# Patient Record
Sex: Female | Born: 1955 | Race: Black or African American | Hispanic: No | Marital: Single | Smoking: Current every day smoker
Health system: Southern US, Community
[De-identification: ages and names within clinical notes are randomized; demographics above are authoritative.]

## PROBLEM LIST (undated history)

## (undated) DIAGNOSIS — K219 Gastro-esophageal reflux disease without esophagitis: Secondary | ICD-10-CM

## (undated) DIAGNOSIS — T7840XA Allergy, unspecified, initial encounter: Secondary | ICD-10-CM

## (undated) DIAGNOSIS — E559 Vitamin D deficiency, unspecified: Secondary | ICD-10-CM

## (undated) DIAGNOSIS — K649 Unspecified hemorrhoids: Secondary | ICD-10-CM

## (undated) DIAGNOSIS — M25562 Pain in left knee: Secondary | ICD-10-CM

## (undated) DIAGNOSIS — I10 Essential (primary) hypertension: Secondary | ICD-10-CM

## (undated) DIAGNOSIS — R7303 Prediabetes: Secondary | ICD-10-CM

## (undated) DIAGNOSIS — C801 Malignant (primary) neoplasm, unspecified: Secondary | ICD-10-CM

## (undated) DIAGNOSIS — E119 Type 2 diabetes mellitus without complications: Secondary | ICD-10-CM

## (undated) DIAGNOSIS — M1990 Unspecified osteoarthritis, unspecified site: Secondary | ICD-10-CM

## (undated) DIAGNOSIS — E7800 Pure hypercholesterolemia, unspecified: Secondary | ICD-10-CM

## (undated) HISTORY — DX: Vitamin D deficiency, unspecified: E55.9

## (undated) HISTORY — DX: Gastro-esophageal reflux disease without esophagitis: K21.9

## (undated) HISTORY — DX: Prediabetes: R73.03

## (undated) HISTORY — DX: Unspecified osteoarthritis, unspecified site: M19.90

## (undated) HISTORY — PX: ABDOMINAL SURGERY: SHX537

## (undated) HISTORY — DX: Pain in left knee: M25.562

## (undated) HISTORY — DX: Allergy, unspecified, initial encounter: T78.40XA

## (undated) HISTORY — DX: Malignant (primary) neoplasm, unspecified: C80.1

## (undated) HISTORY — PX: HEMORRHOID SURGERY: SHX153

## (undated) HISTORY — DX: Type 2 diabetes mellitus without complications: E11.9

## (undated) HISTORY — DX: Unspecified hemorrhoids: K64.9

## (undated) HISTORY — PX: TONSILLECTOMY: SUR1361

---

## 1999-09-09 ENCOUNTER — Other Ambulatory Visit: Admission: RE | Admit: 1999-09-09 | Discharge: 1999-09-09 | Payer: Self-pay | Admitting: Obstetrics

## 1999-09-18 ENCOUNTER — Encounter: Payer: Self-pay | Admitting: Obstetrics

## 1999-09-18 ENCOUNTER — Ambulatory Visit (HOSPITAL_COMMUNITY): Admission: RE | Admit: 1999-09-18 | Discharge: 1999-09-18 | Payer: Self-pay | Admitting: Obstetrics

## 1999-10-02 ENCOUNTER — Emergency Department (HOSPITAL_COMMUNITY): Admission: EM | Admit: 1999-10-02 | Discharge: 1999-10-02 | Payer: Self-pay | Admitting: Emergency Medicine

## 1999-10-02 ENCOUNTER — Encounter: Payer: Self-pay | Admitting: Emergency Medicine

## 2005-01-14 ENCOUNTER — Emergency Department (HOSPITAL_COMMUNITY): Admission: EM | Admit: 2005-01-14 | Discharge: 2005-01-14 | Payer: Self-pay | Admitting: Emergency Medicine

## 2005-02-04 ENCOUNTER — Ambulatory Visit: Payer: Self-pay | Admitting: Family Medicine

## 2005-02-10 ENCOUNTER — Ambulatory Visit: Payer: Self-pay | Admitting: *Deleted

## 2005-03-25 ENCOUNTER — Ambulatory Visit: Payer: Self-pay | Admitting: *Deleted

## 2005-04-08 ENCOUNTER — Emergency Department (HOSPITAL_COMMUNITY): Admission: EM | Admit: 2005-04-08 | Discharge: 2005-04-08 | Payer: Self-pay | Admitting: Emergency Medicine

## 2005-04-16 ENCOUNTER — Emergency Department (HOSPITAL_COMMUNITY): Admission: EM | Admit: 2005-04-16 | Discharge: 2005-04-16 | Payer: Self-pay | Admitting: Family Medicine

## 2005-06-29 ENCOUNTER — Ambulatory Visit: Payer: Self-pay | Admitting: Family Medicine

## 2005-10-30 ENCOUNTER — Ambulatory Visit: Payer: Self-pay | Admitting: Family Medicine

## 2006-02-08 ENCOUNTER — Ambulatory Visit: Payer: Self-pay | Admitting: Family Medicine

## 2006-02-10 ENCOUNTER — Ambulatory Visit: Payer: Self-pay | Admitting: Family Medicine

## 2006-05-11 ENCOUNTER — Ambulatory Visit: Payer: Self-pay | Admitting: Internal Medicine

## 2006-05-18 ENCOUNTER — Ambulatory Visit: Payer: Self-pay | Admitting: Internal Medicine

## 2006-09-10 ENCOUNTER — Emergency Department (HOSPITAL_COMMUNITY): Admission: EM | Admit: 2006-09-10 | Discharge: 2006-09-11 | Payer: Self-pay | Admitting: Emergency Medicine

## 2006-09-20 ENCOUNTER — Emergency Department (HOSPITAL_COMMUNITY): Admission: EM | Admit: 2006-09-20 | Discharge: 2006-09-20 | Payer: Self-pay | Admitting: Family Medicine

## 2006-09-27 ENCOUNTER — Ambulatory Visit: Payer: Self-pay | Admitting: Family Medicine

## 2007-03-30 ENCOUNTER — Encounter (INDEPENDENT_AMBULATORY_CARE_PROVIDER_SITE_OTHER): Payer: Self-pay | Admitting: *Deleted

## 2007-04-27 ENCOUNTER — Encounter (INDEPENDENT_AMBULATORY_CARE_PROVIDER_SITE_OTHER): Payer: Self-pay | Admitting: Family Medicine

## 2007-04-27 ENCOUNTER — Ambulatory Visit: Payer: Self-pay | Admitting: Internal Medicine

## 2007-04-27 ENCOUNTER — Encounter: Payer: Self-pay | Admitting: Family Medicine

## 2007-04-27 LAB — CONVERTED CEMR LAB
ALT: 15 units/L (ref 0–35)
AST: 19 units/L (ref 0–37)
Albumin: 4.4 g/dL (ref 3.5–5.2)
Alkaline Phosphatase: 61 units/L (ref 39–117)
Basophils Relative: 1 % (ref 0–1)
Calcium: 9.7 mg/dL (ref 8.4–10.5)
Chlamydia, DNA Probe: NEGATIVE
Cholesterol: 176 mg/dL (ref 0–200)
Eosinophils Relative: 4 % (ref 0–5)
GC Probe Amp, Genital: NEGATIVE
HCT: 37.4 % (ref 36.0–46.0)
HDL: 40 mg/dL (ref >39)
Hemoglobin: 12.4 g/dL (ref 12.0–15.0)
Lymphocytes Relative: 47 % — ABNORMAL HIGH (ref 12–46)
Monocytes Absolute: 0.5 10*3/uL (ref 0.2–0.7)
Neutro Abs: 2.9 10*3/uL (ref 1.7–7.7)
Neutrophils Relative %: 41 % — ABNORMAL LOW (ref 43–77)
Pap Smear: NORMAL
Platelets: 347 10*3/uL (ref 150–400)
RDW: 14.5 % — ABNORMAL HIGH (ref 11.5–14.0)
Total CHOL/HDL Ratio: 4.4

## 2007-05-02 ENCOUNTER — Ambulatory Visit (HOSPITAL_COMMUNITY): Admission: RE | Admit: 2007-05-02 | Discharge: 2007-05-02 | Payer: Self-pay | Admitting: Family Medicine

## 2007-05-12 ENCOUNTER — Encounter (INDEPENDENT_AMBULATORY_CARE_PROVIDER_SITE_OTHER): Payer: Self-pay | Admitting: Family Medicine

## 2007-05-12 DIAGNOSIS — F1410 Cocaine abuse, uncomplicated: Secondary | ICD-10-CM | POA: Insufficient documentation

## 2007-05-12 DIAGNOSIS — I10 Essential (primary) hypertension: Secondary | ICD-10-CM | POA: Insufficient documentation

## 2007-05-12 DIAGNOSIS — A539 Syphilis, unspecified: Secondary | ICD-10-CM | POA: Insufficient documentation

## 2007-05-12 DIAGNOSIS — F1210 Cannabis abuse, uncomplicated: Secondary | ICD-10-CM | POA: Insufficient documentation

## 2007-06-20 ENCOUNTER — Ambulatory Visit: Payer: Self-pay | Admitting: Internal Medicine

## 2008-01-12 ENCOUNTER — Emergency Department (HOSPITAL_COMMUNITY): Admission: EM | Admit: 2008-01-12 | Discharge: 2008-01-12 | Payer: Self-pay | Admitting: Emergency Medicine

## 2008-07-03 ENCOUNTER — Encounter (INDEPENDENT_AMBULATORY_CARE_PROVIDER_SITE_OTHER): Payer: Self-pay | Admitting: Adult Health

## 2008-07-03 ENCOUNTER — Ambulatory Visit: Payer: Self-pay | Admitting: Internal Medicine

## 2008-07-03 LAB — CONVERTED CEMR LAB
AST: 19 units/L (ref 0–37)
Albumin: 4.8 g/dL (ref 3.5–5.2)
Basophils Absolute: 0 10*3/uL (ref 0.0–0.1)
CO2: 23 meq/L (ref 19–32)
Chloride: 103 meq/L (ref 96–112)
Cholesterol: 231 mg/dL — ABNORMAL HIGH (ref 0–200)
Creatinine, Ser: 1.15 mg/dL (ref 0.40–1.20)
HCT: 41.7 % (ref 36.0–46.0)
HDL: 55 mg/dL (ref >39)
Hemoglobin: 13.4 g/dL (ref 12.0–15.0)
LDL Cholesterol: 151 mg/dL — ABNORMAL HIGH (ref 0–99)
Lymphocytes Relative: 40 % (ref 12–46)
Lymphs Abs: 2.6 10*3/uL (ref 0.7–4.0)
MCHC: 32.1 g/dL (ref 30.0–36.0)
Monocytes Absolute: 0.4 10*3/uL (ref 0.1–1.0)
Monocytes Relative: 6 % (ref 3–12)
Neutro Abs: 3.3 10*3/uL (ref 1.7–7.7)
Platelets: 396 10*3/uL (ref 150–400)
Total Bilirubin: 0.6 mg/dL (ref 0.3–1.2)
VLDL: 25 mg/dL (ref 0–40)
WBC: 6.5 10*3/uL (ref 4.0–10.5)

## 2008-07-13 DIAGNOSIS — M25562 Pain in left knee: Secondary | ICD-10-CM

## 2008-07-13 HISTORY — DX: Pain in left knee: M25.562

## 2009-04-22 ENCOUNTER — Ambulatory Visit: Payer: Self-pay | Admitting: Internal Medicine

## 2009-04-22 ENCOUNTER — Encounter (INDEPENDENT_AMBULATORY_CARE_PROVIDER_SITE_OTHER): Payer: Self-pay | Admitting: Adult Health

## 2009-04-22 LAB — CONVERTED CEMR LAB
ALT: 11 units/L (ref 0–35)
AST: 19 units/L (ref 0–37)
Albumin: 4.8 g/dL (ref 3.5–5.2)
BUN: 17 mg/dL (ref 6–23)
CO2: 23 meq/L (ref 19–32)
Glucose, Bld: 78 mg/dL (ref 70–99)
Potassium: 3.5 meq/L (ref 3.5–5.3)
Sodium: 141 meq/L (ref 135–145)
Total Protein: 7.4 g/dL (ref 6.0–8.3)

## 2009-06-04 ENCOUNTER — Encounter (INDEPENDENT_AMBULATORY_CARE_PROVIDER_SITE_OTHER): Payer: Self-pay | Admitting: Adult Health

## 2009-06-04 ENCOUNTER — Ambulatory Visit: Payer: Self-pay | Admitting: Internal Medicine

## 2009-07-08 ENCOUNTER — Ambulatory Visit (HOSPITAL_COMMUNITY): Admission: RE | Admit: 2009-07-08 | Discharge: 2009-07-08 | Payer: Self-pay | Admitting: Internal Medicine

## 2009-10-02 ENCOUNTER — Ambulatory Visit: Payer: Self-pay | Admitting: Internal Medicine

## 2009-10-21 ENCOUNTER — Ambulatory Visit: Payer: Self-pay | Admitting: Internal Medicine

## 2010-01-20 ENCOUNTER — Ambulatory Visit: Payer: Self-pay | Admitting: Internal Medicine

## 2010-01-20 ENCOUNTER — Encounter (INDEPENDENT_AMBULATORY_CARE_PROVIDER_SITE_OTHER): Payer: Self-pay | Admitting: Adult Health

## 2010-01-20 LAB — CONVERTED CEMR LAB: Microalb, Ur: 0.5 mg/dL (ref 0.00–1.89)

## 2010-01-23 ENCOUNTER — Ambulatory Visit: Payer: Self-pay | Admitting: Internal Medicine

## 2010-01-24 ENCOUNTER — Ambulatory Visit: Payer: Self-pay | Admitting: Internal Medicine

## 2010-01-24 ENCOUNTER — Encounter (INDEPENDENT_AMBULATORY_CARE_PROVIDER_SITE_OTHER): Payer: Self-pay | Admitting: Adult Health

## 2010-01-24 LAB — CONVERTED CEMR LAB
Albumin: 4.7 g/dL (ref 3.5–5.2)
CO2: 24 meq/L (ref 19–32)
Calcium: 10.1 mg/dL (ref 8.4–10.5)
HDL: 44 mg/dL (ref >39)
LDL Cholesterol: 106 mg/dL — ABNORMAL HIGH (ref 0–99)
MCV: 93.6 fL (ref 78.0–100.0)
Platelets: 389 10*3/uL (ref 150–400)
RBC: 4.38 M/uL (ref 3.87–5.11)
RDW: 15 % (ref 11.5–15.5)
Total Bilirubin: 0.5 mg/dL (ref 0.3–1.2)
Triglycerides: 210 mg/dL — ABNORMAL HIGH (ref <150)
VLDL: 42 mg/dL — ABNORMAL HIGH (ref 0–40)
WBC: 7.8 10*3/uL (ref 4.0–10.5)

## 2010-01-28 ENCOUNTER — Ambulatory Visit (HOSPITAL_COMMUNITY): Admission: RE | Admit: 2010-01-28 | Discharge: 2010-01-28 | Payer: Self-pay | Admitting: Surgery

## 2010-08-19 ENCOUNTER — Other Ambulatory Visit (HOSPITAL_COMMUNITY): Payer: Self-pay | Admitting: Internal Medicine

## 2010-08-19 DIAGNOSIS — Z1231 Encounter for screening mammogram for malignant neoplasm of breast: Secondary | ICD-10-CM

## 2010-08-19 DIAGNOSIS — Z139 Encounter for screening, unspecified: Secondary | ICD-10-CM

## 2010-08-28 ENCOUNTER — Ambulatory Visit (HOSPITAL_COMMUNITY)
Admission: RE | Admit: 2010-08-28 | Discharge: 2010-08-28 | Disposition: A | Discharge status: Home / Self Care | Payer: Self-pay | Source: Ambulatory Visit | Attending: Internal Medicine | Admitting: Internal Medicine

## 2010-08-28 DIAGNOSIS — Z1231 Encounter for screening mammogram for malignant neoplasm of breast: Secondary | ICD-10-CM

## 2010-09-16 ENCOUNTER — Other Ambulatory Visit (HOSPITAL_COMMUNITY): Payer: Self-pay | Admitting: Family Medicine

## 2010-09-16 DIAGNOSIS — Z1231 Encounter for screening mammogram for malignant neoplasm of breast: Secondary | ICD-10-CM

## 2010-09-24 ENCOUNTER — Ambulatory Visit (HOSPITAL_COMMUNITY)
Admission: RE | Admit: 2010-09-24 | Discharge: 2010-09-24 | Disposition: A | Discharge status: Home / Self Care | Payer: Self-pay | Source: Ambulatory Visit | Attending: Family Medicine | Admitting: Family Medicine

## 2010-09-24 DIAGNOSIS — Z1231 Encounter for screening mammogram for malignant neoplasm of breast: Secondary | ICD-10-CM | POA: Insufficient documentation

## 2010-09-26 ENCOUNTER — Other Ambulatory Visit: Payer: Self-pay | Admitting: Family Medicine

## 2010-09-26 DIAGNOSIS — R928 Other abnormal and inconclusive findings on diagnostic imaging of breast: Secondary | ICD-10-CM

## 2010-09-27 LAB — RAPID URINE DRUG SCREEN, HOSP PERFORMED
Benzodiazepines: NOT DETECTED
Cocaine: POSITIVE — AB
Opiates: NOT DETECTED

## 2010-09-28 LAB — SURGICAL PCR SCREEN
MRSA, PCR: INVALID — AB
Staphylococcus aureus: INVALID — AB

## 2010-09-28 LAB — CBC
HCT: 37.1 % (ref 36.0–46.0)
MCV: 93 fL (ref 78.0–100.0)

## 2010-09-28 LAB — BASIC METABOLIC PANEL
BUN: 12 mg/dL (ref 6–23)
Calcium: 9.5 mg/dL (ref 8.4–10.5)
Chloride: 102 mEq/L (ref 96–112)
Creatinine, Ser: 1.36 mg/dL — ABNORMAL HIGH (ref 0.4–1.2)
GFR calc Af Amer: 49 mL/min — ABNORMAL LOW (ref >60)
Glucose, Bld: 92 mg/dL (ref 70–99)
Potassium: 3 mEq/L — ABNORMAL LOW (ref 3.5–5.1)
Sodium: 141 mEq/L (ref 135–145)

## 2010-09-28 LAB — DIFFERENTIAL
Lymphocytes Relative: 41 % (ref 12–46)
Monocytes Absolute: 0.3 10*3/uL (ref 0.1–1.0)
Monocytes Relative: 5 % (ref 3–12)
Neutro Abs: 3.3 10*3/uL (ref 1.7–7.7)
Neutrophils Relative %: 51 % (ref 43–77)

## 2010-09-28 LAB — MRSA CULTURE

## 2010-10-02 ENCOUNTER — Ambulatory Visit
Admission: RE | Admit: 2010-10-02 | Discharge: 2010-10-02 | Disposition: A | Discharge status: Home / Self Care | Payer: PRIVATE HEALTH INSURANCE | Source: Ambulatory Visit | Attending: Family Medicine | Admitting: Family Medicine

## 2010-10-02 DIAGNOSIS — R928 Other abnormal and inconclusive findings on diagnostic imaging of breast: Secondary | ICD-10-CM

## 2011-04-09 LAB — URINALYSIS, ROUTINE W REFLEX MICROSCOPIC
Bilirubin Urine: NEGATIVE
pH: 8

## 2011-05-21 ENCOUNTER — Other Ambulatory Visit: Payer: Self-pay | Admitting: Family Medicine

## 2011-05-21 DIAGNOSIS — N6009 Solitary cyst of unspecified breast: Secondary | ICD-10-CM

## 2011-06-01 ENCOUNTER — Ambulatory Visit
Admission: RE | Admit: 2011-06-01 | Discharge: 2011-06-01 | Disposition: A | Discharge status: Home / Self Care | Payer: No Typology Code available for payment source | Source: Ambulatory Visit | Attending: Family Medicine | Admitting: Family Medicine

## 2011-06-01 DIAGNOSIS — N6009 Solitary cyst of unspecified breast: Secondary | ICD-10-CM

## 2011-09-08 ENCOUNTER — Other Ambulatory Visit: Payer: Self-pay | Admitting: Family Medicine

## 2011-09-08 DIAGNOSIS — N6009 Solitary cyst of unspecified breast: Secondary | ICD-10-CM

## 2011-09-28 ENCOUNTER — Ambulatory Visit
Admission: RE | Admit: 2011-09-28 | Discharge: 2011-09-28 | Disposition: A | Discharge status: Home / Self Care | Payer: Self-pay | Source: Ambulatory Visit | Attending: Family Medicine | Admitting: Family Medicine

## 2011-09-28 DIAGNOSIS — N6009 Solitary cyst of unspecified breast: Secondary | ICD-10-CM

## 2014-03-17 ENCOUNTER — Encounter (HOSPITAL_COMMUNITY): Payer: Self-pay | Admitting: Emergency Medicine

## 2014-03-17 ENCOUNTER — Emergency Department (HOSPITAL_COMMUNITY)
Admission: EM | Admit: 2014-03-17 | Discharge: 2014-03-18 | Disposition: A | Discharge status: Home / Self Care | Payer: Self-pay | Attending: Emergency Medicine | Admitting: Emergency Medicine

## 2014-03-17 DIAGNOSIS — I10 Essential (primary) hypertension: Secondary | ICD-10-CM | POA: Insufficient documentation

## 2014-03-17 DIAGNOSIS — M79609 Pain in unspecified limb: Secondary | ICD-10-CM | POA: Insufficient documentation

## 2014-03-17 DIAGNOSIS — Z79899 Other long term (current) drug therapy: Secondary | ICD-10-CM | POA: Insufficient documentation

## 2014-03-17 DIAGNOSIS — R109 Unspecified abdominal pain: Secondary | ICD-10-CM | POA: Insufficient documentation

## 2014-03-17 DIAGNOSIS — E7800 Pure hypercholesterolemia, unspecified: Secondary | ICD-10-CM | POA: Insufficient documentation

## 2014-03-17 DIAGNOSIS — R1032 Left lower quadrant pain: Secondary | ICD-10-CM

## 2014-03-17 DIAGNOSIS — F17200 Nicotine dependence, unspecified, uncomplicated: Secondary | ICD-10-CM | POA: Insufficient documentation

## 2014-03-17 HISTORY — DX: Essential (primary) hypertension: I10

## 2014-03-17 HISTORY — DX: Pure hypercholesterolemia, unspecified: E78.00

## 2014-03-17 LAB — URINALYSIS, ROUTINE W REFLEX MICROSCOPIC
Bilirubin Urine: NEGATIVE
GLUCOSE, UA: NEGATIVE mg/dL
HGB URINE DIPSTICK: NEGATIVE
Ketones, ur: NEGATIVE mg/dL
LEUKOCYTES UA: NEGATIVE
Nitrite: NEGATIVE
PH: 7 (ref 5.0–8.0)
PROTEIN: NEGATIVE mg/dL
SPECIFIC GRAVITY, URINE: 1.013 (ref 1.005–1.030)
Urobilinogen, UA: 0.2 mg/dL (ref 0.0–1.0)

## 2014-03-17 NOTE — ED Provider Notes (Signed)
CSN: [id number]     Arrival date & time 03/17/14  1907 History   First MD Initiated Contact with Patient 03/17/14 2239     Chief Complaint  Patient presents with  . Leg Pain     (Consider location/radiation/quality/duration/timing/severity/associated sxs/prior Treatment) HPI Comments: The patient is a 58 year old female presenting with left-sided groin pain ongoing for 2 days. The patient reports similar symptoms in the past with a UTI several years ago. Patient denies pain at this time, relieved with Tylenol at home. Denies urinary symptoms, hematuria, dysuria. Denies vaginal discharge, vaginal lesions. She denies abdominal pain, nausea, vomiting, diarrhea, constipation. Denies fever chills. Denies recent injury.  No aggravating or relieving factors, not worsened with movement. No lower extremity swelling, calf tenderness. No recent travel, family history or personal history of DVT/PE, lower extremity swelling, cancer, or exogenous estrogen.  PCP: [facility]  Patient is a 58 y.o. female presenting with leg pain. The history is provided by the patient. No language interpreter was used.  Leg Pain Associated symptoms: no back pain and no fever     Past Medical History  Diagnosis Date  . Hypertension   . Hypercholesteremia    History reviewed. No pertinent past surgical history. History reviewed. No pertinent family history. History  Substance Use Topics  . Smoking status: Current Every Day Smoker  . Smokeless tobacco: Never Used  . Alcohol Use: Yes     Comment: occasionally   OB History   Grav Para Term Preterm Abortions TAB SAB Ect Mult Living                 Review of Systems  Constitutional: Negative for fever and chills.  Respiratory: Negative for shortness of breath.   Cardiovascular: Negative for chest pain, palpitations and leg swelling.  Gastrointestinal: Negative for nausea, vomiting, abdominal pain, diarrhea, constipation, blood in stool and anal bleeding.   Genitourinary: Negative for dysuria, urgency, hematuria, flank pain, vaginal discharge and genital sores.  Musculoskeletal: Negative for back pain.      Allergies  Review of patient's allergies indicates no known allergies.  Home Medications   Prior to Admission medications   Medication Sig Start Date End Date Taking? Authorizing Provider  atenolol-chlorthalidone (TENORETIC) 50-25 MG per tablet Take 1 tablet by mouth daily.   Yes Historical Provider, MD  simvastatin (ZOCOR) 10 MG tablet Take 10 mg by mouth daily at 6 PM.   Yes Historical Provider, MD   BP 132/81  Pulse 65  Temp(Src) 98.1 F (36.7 C) (Oral)  Resp 20  Ht 5\' 2"  (1.575 m)  Wt 117 lb 9.6 oz (53.343 kg)  BMI 21.50 kg/m2  SpO2 98% Physical Exam  Nursing note and vitals reviewed. Constitutional: She is oriented to person, place, and time. She appears well-developed and well-nourished.  Non-toxic appearance. She does not have a sickly appearance. She does not appear ill. No distress.  Pt eating [employer] upon entering room.  HENT:  Head: Normocephalic and atraumatic.  Eyes: EOM are normal. Pupils are equal, round, and reactive to light. Right eye exhibits no discharge. Left eye exhibits no discharge. No scleral icterus.  Neck: Normal range of motion. Neck supple.  Cardiovascular: Normal rate and regular rhythm.   No murmur heard. No lower extremity edema  Pulmonary/Chest: Effort normal and breath sounds normal. She has no wheezes. She has no rales. She exhibits no tenderness.  Abdominal: Soft. Bowel sounds are normal. She exhibits no distension. There is no tenderness. There is no rebound, no guarding and  no CVA tenderness. Hernia confirmed negative in the left inguinal area.  No tenderness to left groin or palpable lymphadenopathy.  Musculoskeletal: Normal range of motion. She exhibits no edema.  Left lower extremity: Full active range of motion without discomfort. Good strength and sensation equal bilaterally. Normal  gait.  Lymphadenopathy:       Left: No inguinal adenopathy present.  Neurological: She is alert and oriented to person, place, and time.  Skin: Skin is warm and dry. No rash noted. She is not diaphoretic.  Psychiatric: She has a normal mood and affect. Her behavior is normal. Thought content normal.    ED Course  Procedures (including critical care time) Labs Review Labs Reviewed  URINALYSIS, ROUTINE W REFLEX MICROSCOPIC    Imaging Review No results found.   EKG Interpretation None      MDM   Final diagnoses:  Groin pain, left   Patient presents with left groin pain not reproducible no abdominal pain no CVA tenderness patient is nontoxic, afebrile. UA ordered. No sign DVT on exam, Wells criteria negative. UA negative for infection. Patient denies pain in ED. Plan to discharge with instructions to followup with PCP drink plenty of fluids. Discussed lab results and treatment plan with the patient. Return precautions given. Reports understanding and no other concerns at this time.  Patient is stable for discharge at this time.   Harvie Heck, PA-C 03/18/14 [phone]

## 2014-03-17 NOTE — ED Notes (Signed)
No answer

## 2014-03-17 NOTE — ED Notes (Signed)
Patient presents stating she has been having pain to the top of her left thigh up in to the left lower abd

## 2014-03-18 NOTE — ED Provider Notes (Signed)
Medical screening examination/treatment/procedure(s) were performed by non-physician practitioner and as supervising physician I was immediately available for consultation/collaboration.   EKG Interpretation None        Delice Bison Tad Fancher, DO 03/18/14 1923

## 2014-03-18 NOTE — ED Notes (Signed)
Per EMT, pt walked out of room dressed in street clothes, stated goodbye to EMT and left department. Event not witnessed by RN.

## 2014-03-18 NOTE — Discharge Instructions (Signed)
Call for a follow up appointment with a Family or Primary Care Provider.  Return if Symptoms worsen.   Take medication as prescribed.  Drink plenty of fluids.

## 2014-05-07 ENCOUNTER — Other Ambulatory Visit: Payer: Self-pay | Admitting: Primary Care

## 2014-05-07 DIAGNOSIS — Z1231 Encounter for screening mammogram for malignant neoplasm of breast: Secondary | ICD-10-CM

## 2014-05-16 ENCOUNTER — Ambulatory Visit
Admission: RE | Admit: 2014-05-16 | Discharge: 2014-05-16 | Disposition: A | Discharge status: Home / Self Care | Payer: No Typology Code available for payment source | Source: Ambulatory Visit | Attending: Primary Care | Admitting: Primary Care

## 2014-05-16 DIAGNOSIS — Z1231 Encounter for screening mammogram for malignant neoplasm of breast: Secondary | ICD-10-CM

## 2015-01-27 ENCOUNTER — Emergency Department (INDEPENDENT_AMBULATORY_CARE_PROVIDER_SITE_OTHER)
Admission: EM | Admit: 2015-01-27 | Discharge: 2015-01-27 | Disposition: A | Discharge status: Home / Self Care | Payer: No Typology Code available for payment source | Source: Home / Self Care | Attending: Emergency Medicine | Admitting: Emergency Medicine

## 2015-01-27 ENCOUNTER — Encounter (HOSPITAL_COMMUNITY): Payer: Self-pay | Admitting: *Deleted

## 2015-01-27 DIAGNOSIS — H6002 Abscess of left external ear: Secondary | ICD-10-CM

## 2015-01-27 DIAGNOSIS — H6012 Cellulitis of left external ear: Secondary | ICD-10-CM

## 2015-01-27 MED ORDER — NEOMYCIN-POLYMYXIN-HC 3.5-10000-1 OT SUSP: 4.0000 [drp] | Freq: Four times a day (QID) | OTIC | Status: DC

## 2015-01-27 MED ORDER — HYDROCODONE-ACETAMINOPHEN 5-325 MG PO TABS
ORAL_TABLET | ORAL | Status: AC
  Filled 2015-01-27: qty 1

## 2015-01-27 MED ORDER — HYDROCODONE-ACETAMINOPHEN 5-325 MG PO TABS: 2.0000 | ORAL_TABLET | ORAL | Status: DC | PRN

## 2015-01-27 MED ORDER — HYDROCODONE-ACETAMINOPHEN 5-325 MG PO TABS
1.0000 | ORAL_TABLET | Freq: Once | ORAL | Status: AC
  Administered 2015-01-27: 1 via ORAL

## 2015-01-27 MED ORDER — DOXYCYCLINE HYCLATE 100 MG PO CAPS: 100.0000 mg | ORAL_CAPSULE | Freq: Two times a day (BID) | ORAL | Status: DC

## 2015-01-27 NOTE — ED Notes (Signed)
C/O left external ear pain x 1 wk with small abscess to lower ear.  Had same infection 5/31 - was treated with course of SMZ-TMP DS.  Has taken Advil and been cleaning area with soap daily without any relief.

## 2015-01-27 NOTE — ED Provider Notes (Addendum)
CSN: [id number]     Arrival date & time 01/27/15  1302 History   First MD Initiated Contact with Patient 01/27/15 1316     Chief Complaint  Patient presents with  . Otalgia   (Consider location/radiation/quality/duration/timing/severity/associated sxs/prior Treatment) HPI She is a 59 year old woman here for evaluation of left ear pain. She states this started about a week ago. She reports pain primarily in the pinna. She does also report some internal ear discomfort and itching. She has been washing the ear with Dial soap as well as applying hydrocortisone cream to the ear. She has been taking Aleve for the pain without much improvement. She reports a throbbing pain in her ear and left jaw.  No fevers or chills. No nausea or vomiting. She states she had the same thing in May. She was treated with Bactrim and her symptoms resolved.  Past Medical History  Diagnosis Date  . Hypertension   . Hypercholesteremia    History reviewed. No pertinent past surgical history. No family history on file. History  Substance Use Topics  . Smoking status: Current Every Day Smoker  . Smokeless tobacco: Never Used  . Alcohol Use: Yes     Comment: one beer q 2 wks   OB History    No data available     Review of Systems As in history of present illness Allergies  Review of patient's allergies indicates no known allergies.  Home Medications   Prior to Admission medications   Medication Sig Start Date End Date Taking? Authorizing Provider  atenolol-chlorthalidone (TENORETIC) 50-25 MG per tablet Take 1 tablet by mouth daily.   Yes Historical Provider, MD  simvastatin (ZOCOR) 10 MG tablet Take 10 mg by mouth daily at 6 PM.   Yes Historical Provider, MD  doxycycline (VIBRAMYCIN) 100 MG capsule Take 1 capsule (100 mg total) by mouth 2 (two) times daily. 01/27/15   Melony Overly, MD  HYDROcodone-acetaminophen (NORCO/VICODIN) 5-325 MG per tablet Take 2 tablets by mouth every 4 (four) hours as needed. 01/27/15    Melony Overly, MD  neomycin-polymyxin-hydrocortisone (CORTISPORIN) 3.5-10000-1 otic suspension Place 4 drops into the left ear 4 (four) times daily. 01/27/15   Melony Overly, MD   BP 129/84 mmHg  Pulse 69  Temp(Src) 98.4 F (36.9 C) (Oral)  Resp 18  SpO2 97% Physical Exam  Constitutional: She is oriented to person, place, and time. She appears well-developed and well-nourished. No distress.  HENT:  Right Ear: External ear and ear canal normal.  Ears:  Left external ear appears swollen and slightly erythematous. She has several blisters at the entrance to the ear canal. There is white cream in the ear canal (pt states she put cortisone cream in her ear for itching).  TM is normal.  Cardiovascular: Normal rate.   Pulmonary/Chest: Effort normal.  Neurological: She is alert and oriented to person, place, and time.    ED Course  INCISION AND DRAINAGE Date/Time: 01/27/2015 1:57 PM Performed by: Melony Overly Authorized by: Melony Overly Consent: Verbal consent obtained. Risks and benefits: risks, benefits and alternatives were discussed Consent given by: patient Patient understanding: patient states understanding of the procedure being performed Patient identity confirmed: verbally with patient Time out: Immediately prior to procedure a "time out" was called to verify the correct patient, procedure, equipment, support staff and site/side marked as required. Type: abscess Body area: head/neck Location details: left external ear Anesthesia method: Cold spray. Scalpel size: 11 Incision type: single straight Complexity: simple  Drainage: purulent and  bloody Drainage amount: scant Wound treatment: wound left open Patient tolerance: Patient tolerated the procedure well with no immediate complications   (including critical care time) Labs Review Labs Reviewed - No data to display  Imaging Review No results found.   MDM   1. Abscess of left earlobe   2. Cellulitis of left ear      Treat with Cortisporin drops and doxycycline. Return precautions reviewed. If this becomes recurrent, recommended follow-up with ENT specialist.  Norco 5-'325mg'$  given for pain.  Melony Overly, MD 01/27/15 [place], MD 01/27/15 [phone]

## 2015-01-27 NOTE — Discharge Instructions (Signed)
You have an infection of your ear. Use the eardrops 4 times a day for the next 10 days. Take doxycycline twice a day for the next 10 days. If this becomes a recurrent problem, please see the ENT specialist. If your symptoms are getting worse, you develop fevers, you start vomiting, please go to the emergency room.

## 2015-07-11 ENCOUNTER — Other Ambulatory Visit: Payer: Self-pay

## 2015-07-11 DIAGNOSIS — Z1231 Encounter for screening mammogram for malignant neoplasm of breast: Secondary | ICD-10-CM

## 2015-07-24 ENCOUNTER — Ambulatory Visit: Payer: Self-pay

## 2016-07-05 ENCOUNTER — Encounter (HOSPITAL_COMMUNITY): Payer: Self-pay | Admitting: *Deleted

## 2016-07-05 ENCOUNTER — Ambulatory Visit (HOSPITAL_COMMUNITY)
Admission: EM | Admit: 2016-07-05 | Discharge: 2016-07-05 | Disposition: A | Discharge status: Home / Self Care | Payer: Self-pay | Attending: Family Medicine | Admitting: Family Medicine

## 2016-07-05 DIAGNOSIS — H00011 Hordeolum externum right upper eyelid: Secondary | ICD-10-CM

## 2016-07-05 MED ORDER — TOBRAMYCIN 0.3 % OP SOLN: 1.0000 [drp] | OPHTHALMIC | 0 refills | Status: DC

## 2016-07-05 MED ORDER — IBUPROFEN 800 MG PO TABS
800.0000 mg | ORAL_TABLET | Freq: Once | ORAL | Status: AC
  Administered 2016-07-05: 800 mg via ORAL

## 2016-07-05 MED ORDER — IBUPROFEN 800 MG PO TABS
ORAL_TABLET | ORAL | Status: AC
  Filled 2016-07-05: qty 1

## 2016-07-05 MED ORDER — CEPHALEXIN 500 MG PO CAPS: 500.0000 mg | ORAL_CAPSULE | Freq: Three times a day (TID) | ORAL | 0 refills | Status: DC

## 2016-07-05 NOTE — ED Provider Notes (Signed)
[place]    CSN: [id number] Arrival date & time: 07/05/16  1508     History   Chief Complaint Chief Complaint  Patient presents with  . Eye Problem    HPI Meredith Goodman is a 60 y.o. female.   The history is provided by the patient.  Eye Problem  Location:  Right eye Quality:  Burning and sharp Onset quality:  Gradual Duration:  3 days Progression:  Unchanged Chronicity:  New Relieved by:  None tried Worsened by:  Nothing Ineffective treatments:  None tried Associated symptoms: discharge   Associated symptoms: no decreased vision, no double vision, no photophobia, no redness, no swelling and no tearing     Past Medical History:  Diagnosis Date  . Hypercholesteremia   . Hypertension     Patient Active Problem List   Diagnosis Date Noted  . 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 MARIJUANA ABUSE 05/12/2007  . COCAINE ABUSE 05/12/2007  . HYPERTENSION 05/12/2007    History reviewed. No pertinent surgical history.  OB History    No data available       Home Medications    Prior to Admission medications   Medication Sig Start Date End Date Taking? Authorizing Provider  atenolol-chlorthalidone (TENORETIC) 50-25 MG per tablet Take 1 tablet by mouth daily.    Historical Provider, MD  doxycycline (VIBRAMYCIN) 100 MG capsule Take 1 capsule (100 mg total) by mouth 2 (two) times daily. 01/27/15   Melony Overly, MD  HYDROcodone-acetaminophen (NORCO/VICODIN) 5-325 MG per tablet Take 2 tablets by mouth every 4 (four) hours as needed. 01/27/15   Melony Overly, MD  neomycin-polymyxin-hydrocortisone (CORTISPORIN) 3.5-10000-1 otic suspension Place 4 drops into the left ear 4 (four) times daily. 01/27/15   Melony Overly, MD  simvastatin (ZOCOR) 10 MG tablet Take 10 mg by mouth daily at 6 PM.    Historical Provider, MD    Family History History reviewed. No pertinent family history.  Social History Social History  Substance Use Topics  . Smoking status: Current Every Day  Smoker  . Smokeless tobacco: Never Used  . Alcohol use Yes     Comment: one beer q 2 wks     Allergies   Patient has no known allergies.   Review of Systems Review of Systems  Constitutional: Negative.   HENT: Negative.   Eyes: Positive for discharge. Negative for double vision, photophobia, pain, redness and visual disturbance.  Respiratory: Negative.   All other systems reviewed and are negative.    Physical Exam Triage Vital Signs ED Triage Vitals  Enc Vitals Group     BP 07/05/16 1532 118/72     Pulse Rate 07/05/16 1532 80     Resp 07/05/16 1532 18     Temp 07/05/16 1532 98.6 F (37 C)     Temp Source 07/05/16 1532 Oral     SpO2 07/05/16 1532 100 %     Weight --      Height --      Head Circumference --      Peak Flow --      Pain Score 07/05/16 1533 3     Pain Loc --      Pain Edu? --      Excl. in [place]? --    No data found.   Updated Vital Signs BP 118/72 (BP Location: Right Arm)   Pulse 80   Temp 98.6 F (37 C) (Oral)   Resp 18   SpO2 100%   Visual  Acuity Right Eye Distance:   Left Eye Distance:   Bilateral Distance:    Right Eye Near:   Left Eye Near:    Bilateral Near:     Physical Exam  Constitutional: She appears well-developed and well-nourished. No distress.  HENT:  Head: Normocephalic.  Right Ear: External ear normal.  Left Ear: External ear normal.  Nose: Nose normal.  Mouth/Throat: Oropharynx is clear and moist.  Eyes: Conjunctivae and EOM are normal. Pupils are equal, round, and reactive to light. Right eye exhibits discharge and exudate. Right eye exhibits no hordeolum. No foreign body present in the right eye. No scleral icterus.  Neck: Normal range of motion. Neck supple.  Lymphadenopathy:    She has no cervical adenopathy.  Nursing note and vitals reviewed.    UC Treatments / Results  Labs (all labs ordered are listed, but only abnormal results are displayed) Labs Reviewed - No data to display  EKG  EKG  Interpretation None       Radiology No results found.  Procedures Procedures (including critical care time)  Medications Ordered in UC Medications - No data to display   Initial Impression / Assessment and Plan / UC Course  I have reviewed the triage vital signs and the nursing notes.  Pertinent labs & imaging results that were available during my care of the patient were reviewed by me and considered in my medical decision making (see chart for details).  Clinical Course       Final Clinical Impressions(s) / UC Diagnoses   Final diagnoses:  None    New Prescriptions New Prescriptions   No medications on file     Billy Fischer, MD 07/21/16 1006

## 2016-07-05 NOTE — ED Triage Notes (Signed)
Pt   Reports     Symptoms  Of      Swelling  To   r   Eye         X   sev   Days   with  Redness   And  Pain    denys   Any  Injury

## 2016-12-26 ENCOUNTER — Ambulatory Visit (HOSPITAL_COMMUNITY)
Admission: EM | Admit: 2016-12-26 | Discharge: 2016-12-26 | Disposition: A | Discharge status: Home / Self Care | Payer: Self-pay | Attending: Family Medicine | Admitting: Family Medicine

## 2016-12-26 ENCOUNTER — Encounter (HOSPITAL_COMMUNITY): Payer: Self-pay | Admitting: *Deleted

## 2016-12-26 DIAGNOSIS — M791 Myalgia, unspecified site: Secondary | ICD-10-CM

## 2016-12-26 DIAGNOSIS — R109 Unspecified abdominal pain: Secondary | ICD-10-CM

## 2016-12-26 LAB — POCT URINALYSIS DIP (DEVICE)
Bilirubin Urine: NEGATIVE
Glucose, UA: NEGATIVE mg/dL
Hgb urine dipstick: NEGATIVE
Ketones, ur: NEGATIVE mg/dL
Leukocytes, UA: NEGATIVE
Nitrite: NEGATIVE
Protein, ur: NEGATIVE mg/dL
Specific Gravity, Urine: 1.015 (ref 1.005–1.030)
Urobilinogen, UA: 0.2 mg/dL (ref 0.0–1.0)
pH: 6.5 (ref 5.0–8.0)

## 2016-12-26 NOTE — Discharge Instructions (Signed)
Your urine looks good today.  I highly doubt a kidney stone.  I suspect that your pain is from your muscle.  Continue with ibuprofen or tylenol at home as they have been helpful so far.  Put heat over the area Return or go to ER or see your primary care doctor if you get worse.

## 2016-12-26 NOTE — ED Provider Notes (Signed)
CSN: [id number]     Arrival date & time 12/26/16  1205 History   First MD Initiated Contact with Patient 12/26/16 1315     Chief Complaint  Patient presents with  . Flank Pain   (Consider location/radiation/quality/duration/timing/severity/associated sxs/prior Treatment) Patient is a 61 y.o. Female, presents today for 2-day duration of right side pain/right flank pain. She describes the pain as sharp/dull and intermittent. She denies urinary symptoms. She denies history of kidney stone. She states that she have been cleaning at her home but no heavy lifting or anything extraneous. She have been taking ibuprofen, which seems to have helped. She reports movement makes the pain worse. She has no fever. No nausea. No abdominal Pain.         Past Medical History:  Diagnosis Date  . Hypercholesteremia   . Hypertension    History reviewed. No pertinent surgical history. No family history on file. Social History  Substance Use Topics  . Smoking status: Current Every Day Smoker  . Smokeless tobacco: Never Used  . Alcohol use Yes     Comment: occasionally   OB History    No data available     Review of Systems  Constitutional:       See HPI    Allergies  Patient has no known allergies.  Home Medications   Prior to Admission medications   Medication Sig Start Date End Date Taking? Authorizing Provider  atenolol-chlorthalidone (TENORETIC) 50-25 MG per tablet Take 1 tablet by mouth daily.   Yes [provider]  simvastatin (ZOCOR) 10 MG tablet Take 10 mg by mouth daily at 6 PM.   Yes [provider]  cephALEXin (KEFLEX) 500 MG capsule Take 1 capsule (500 mg total) by mouth 3 (three) times daily. Take all of medicine and drink lots of fluids 07/05/16   Billy Fischer, MD  doxycycline (VIBRAMYCIN) 100 MG capsule Take 1 capsule (100 mg total) by mouth 2 (two) times daily. 01/27/15   Melony Overly, MD  HYDROcodone-acetaminophen (NORCO/VICODIN) 5-325 MG per tablet  Take 2 tablets by mouth every 4 (four) hours as needed. 01/27/15   Melony Overly, MD  neomycin-polymyxin-hydrocortisone (CORTISPORIN) 3.5-10000-1 otic suspension Place 4 drops into the left ear 4 (four) times daily. 01/27/15   Melony Overly, MD  tobramycin (TOBREX) 0.3 % ophthalmic solution Place 1 drop into the right eye every 4 (four) hours. 07/05/16   Billy Fischer, MD   Meds Ordered and Administered this Visit  Medications - No data to display  BP (!) 148/89   Pulse 74   Temp 98 F (36.7 C) (Oral)   Resp 20   SpO2 100%  No data found.   Physical Exam  Constitutional: She is oriented to person, place, and time. She appears well-developed and well-nourished.  Calm and relax  Cardiovascular: Normal rate, regular rhythm and normal heart sounds.   No murmur heard. Pulmonary/Chest: Effort normal and breath sounds normal. She has no wheezes.      Abdominal: Soft. Bowel sounds are normal. There is no tenderness.    Genitourinary:  Genitourinary Comments: Positive right CVA tenderness  Musculoskeletal: Normal range of motion.  Slightly sore to palpate over right lower back area  Neurological: She is alert and oriented to person, place, and time.  Skin: Skin is warm and dry.  Nursing note and vitals reviewed.   Urgent Care Course     Procedures (including critical care time)  Labs Review Labs Reviewed  POCT URINALYSIS  DIP (DEVICE)    Imaging Review No results found.  MDM   1. Muscle pain    1) UA completely normal without hematuria 2) Highly doubt nephrolithiasis 2) Pain is suspected to be due to MSK etiology 3) Instructed to continue ibuprofen or tylenol at home for pain relief.  4) May massage the area 5) Apply heat to the area 6) Return precaution discussed.     Barry Dienes, NP 12/26/16 1334

## 2016-12-26 NOTE — ED Triage Notes (Signed)
C/O right side pain x 2 days.  Denies urinary sxs.

## 2017-02-26 ENCOUNTER — Other Ambulatory Visit: Payer: Self-pay | Admitting: Primary Care

## 2017-02-26 DIAGNOSIS — Z1231 Encounter for screening mammogram for malignant neoplasm of breast: Secondary | ICD-10-CM

## 2017-04-08 ENCOUNTER — Other Ambulatory Visit: Payer: Self-pay | Admitting: Obstetrics and Gynecology

## 2017-04-08 DIAGNOSIS — Z1231 Encounter for screening mammogram for malignant neoplasm of breast: Secondary | ICD-10-CM

## 2017-04-22 ENCOUNTER — Ambulatory Visit
Admission: RE | Admit: 2017-04-22 | Discharge: 2017-04-22 | Disposition: A | Discharge status: Home / Self Care | Payer: No Typology Code available for payment source | Source: Ambulatory Visit | Attending: Obstetrics and Gynecology | Admitting: Obstetrics and Gynecology

## 2017-04-22 ENCOUNTER — Encounter (HOSPITAL_COMMUNITY): Payer: Self-pay

## 2017-04-22 ENCOUNTER — Ambulatory Visit (HOSPITAL_COMMUNITY)
Admission: RE | Admit: 2017-04-22 | Discharge: 2017-04-22 | Disposition: A | Discharge status: Home / Self Care | Payer: Self-pay | Source: Ambulatory Visit | Attending: Obstetrics and Gynecology | Admitting: Obstetrics and Gynecology

## 2017-04-22 VITALS — BP 108/68 | Temp 98.6°F | Ht 62.0 in | Wt 107.8 lb

## 2017-04-22 DIAGNOSIS — Z1239 Encounter for other screening for malignant neoplasm of breast: Secondary | ICD-10-CM

## 2017-04-22 DIAGNOSIS — Z1231 Encounter for screening mammogram for malignant neoplasm of breast: Secondary | ICD-10-CM

## 2017-04-22 NOTE — Patient Instructions (Signed)
Explained breast self awareness with [name]. Patient did not need a Pap smear today due to last Pap smear was in 2016 per patient. Let her know [facility] will cover Pap smears every 3 years unless has a history of abnormal Pap smears. Reminded patient that her next Pap smear will be due next year. Told patient she can schedule with [facility]. Referred patient to the [facility] for a screening mammogram. Appointment scheduled for Thursday, April 22, 2017 at 1540. Let patient know the [facility] will follow up with her within the next couple weeks with results of mammogram by letter or phone. Discussed smoking cessation with patient. Referred to the [facility] Quitline and gave resources to free smoking cessation classes offered at [facility]. Meredith Goodman verbalized understanding.  Kaeson Kleinert, Arvil Chaco, RN 6:16 PM

## 2017-04-22 NOTE — Progress Notes (Signed)
No complaints today.   Pap Smear: Pap smear not completed today. Last Pap smear was in 2016 at [facility] and normal per patient. Per patient has no history of an abnormal Pap smear. No Pap smear results are in EPIC.  Physical exam: Breasts Breasts symmetrical. No skin abnormalities bilateral breasts. No nipple retraction bilateral breasts. No nipple discharge bilateral breasts. No lymphadenopathy. No lumps palpated bilateral breasts. No complaints of pain or tenderness on exam. Referred patient to the [facility] for a screening mammogram. Appointment scheduled for Thursday, April 22, 2017 at 1540.       Pelvic/Bimanual No Pap smear completed today since last Pap smear was in 2016 per patient. Pap smear not indicated per [facility] guidelines.   Smoking History: Patient is a current smoker. Discussed smoking cessation with patient. Referred to the [facility] Quitline and gave resources to free smoking cessation classes offered at [facility].  Patient Navigation: Patient education provided. Access to services provided for patient through [facility] program.   Colorectal Cancer Screening: Per patient has never had a colonoscopy completed. No complaints today. FIT Test given to patient to complete and return to [facility].

## 2017-04-23 ENCOUNTER — Encounter (HOSPITAL_COMMUNITY): Payer: Self-pay | Admitting: *Deleted

## 2017-04-23 ENCOUNTER — Other Ambulatory Visit: Payer: Self-pay

## 2017-04-26 ENCOUNTER — Other Ambulatory Visit: Payer: Self-pay | Admitting: Obstetrics and Gynecology

## 2017-04-26 DIAGNOSIS — R928 Other abnormal and inconclusive findings on diagnostic imaging of breast: Secondary | ICD-10-CM

## 2017-04-28 ENCOUNTER — Ambulatory Visit
Admission: RE | Admit: 2017-04-28 | Discharge: 2017-04-28 | Disposition: A | Discharge status: Home / Self Care | Payer: No Typology Code available for payment source | Source: Ambulatory Visit | Attending: Obstetrics and Gynecology | Admitting: Obstetrics and Gynecology

## 2017-04-28 DIAGNOSIS — R928 Other abnormal and inconclusive findings on diagnostic imaging of breast: Secondary | ICD-10-CM

## 2017-05-02 LAB — FECAL OCCULT BLOOD, IMMUNOCHEMICAL: FECAL OCCULT BLD: POSITIVE — AB

## 2017-05-03 ENCOUNTER — Telehealth (HOSPITAL_COMMUNITY): Payer: Self-pay

## 2017-05-03 ENCOUNTER — Encounter (HOSPITAL_COMMUNITY): Payer: Self-pay | Admitting: *Deleted

## 2017-05-03 NOTE — Telephone Encounter (Signed)
Phoned patient to discuss Fecal test results. Stool tested positive for occult blood. Patient stated understanding and will make an appointment with her physician to follow up.

## 2017-07-02 ENCOUNTER — Encounter: Payer: Self-pay | Admitting: Gastroenterology

## 2017-07-02 ENCOUNTER — Telehealth (HOSPITAL_COMMUNITY): Payer: Self-pay | Admitting: *Deleted

## 2017-07-02 NOTE — Telephone Encounter (Signed)
Patient called me today for a referral due to her FIT Test being positive that was completed 04/23/2017. The FIT Test was given to her at [facility] clinic on 04/22/2017. Patient stated she has the orange card. Told patient I can refer her to [facility] for follow-up. Let her know someone from their office will call her to schedule appointment. Patient verbalized understanding.  Called referral to [facility] today. Gave referral to Lorriane Shire and someone from their office will call patient.

## 2017-08-04 ENCOUNTER — Ambulatory Visit (AMBULATORY_SURGERY_CENTER): Payer: Self-pay

## 2017-08-04 VITALS — Ht 62.0 in | Wt 110.4 lb

## 2017-08-04 DIAGNOSIS — R195 Other fecal abnormalities: Secondary | ICD-10-CM

## 2017-08-04 NOTE — Progress Notes (Signed)
Per pt, no allergies to soy or egg products.Pt not taking any weight loss meds or using  O2 at home.  Pt does not have email!

## 2017-08-18 ENCOUNTER — Encounter: Payer: Self-pay | Admitting: Gastroenterology

## 2017-08-18 ENCOUNTER — Other Ambulatory Visit: Payer: Self-pay

## 2017-08-18 ENCOUNTER — Ambulatory Visit (AMBULATORY_SURGERY_CENTER): Payer: Self-pay | Admitting: Gastroenterology

## 2017-08-18 VITALS — BP 158/93 | HR 66 | Temp 97.3°F | Resp 17 | Ht 62.0 in | Wt 110.0 lb

## 2017-08-18 DIAGNOSIS — K6389 Other specified diseases of intestine: Secondary | ICD-10-CM

## 2017-08-18 DIAGNOSIS — K639 Disease of intestine, unspecified: Secondary | ICD-10-CM

## 2017-08-18 DIAGNOSIS — R195 Other fecal abnormalities: Secondary | ICD-10-CM

## 2017-08-18 DIAGNOSIS — D1339 Benign neoplasm of other parts of small intestine: Secondary | ICD-10-CM

## 2017-08-18 DIAGNOSIS — D129 Benign neoplasm of anus and anal canal: Secondary | ICD-10-CM

## 2017-08-18 DIAGNOSIS — D123 Benign neoplasm of transverse colon: Secondary | ICD-10-CM

## 2017-08-18 DIAGNOSIS — D128 Benign neoplasm of rectum: Secondary | ICD-10-CM

## 2017-08-18 MED ORDER — SODIUM CHLORIDE 0.9 % IV SOLN: 500.0000 mL | Freq: Once | INTRAVENOUS | Status: DC

## 2017-08-18 NOTE — Progress Notes (Signed)
To recovery, report to RN, VSS. 

## 2017-08-18 NOTE — Progress Notes (Signed)
Pt's states no medical or surgical changes since previsit or office visit. 

## 2017-08-18 NOTE — Op Note (Signed)
[facility] Patient Name: Meredith Goodman Procedure Date: 08/18/2017 10:52 AM MRN: [id number] Endoscopist: Mauri Pole , MD Age: 62 Referring MD:  Date of Birth: [birth year] Gender: Female Account #: [id number] Procedure:                Colonoscopy Indications:              Positive Cologuard test Medicines:                Monitored Anesthesia Care Procedure:                Pre-Anesthesia Assessment:                           - Prior to the procedure, a History and Physical                            was performed, and patient medications and                            allergies were reviewed. The patient's tolerance of                            previous anesthesia was also reviewed. The risks                            and benefits of the procedure and the sedation                            options and risks were discussed with the patient.                            All questions were answered, and informed consent                            was obtained. Prior Anticoagulants: The patient has                            taken no previous anticoagulant or antiplatelet                            agents. ASA Grade Assessment: II - A patient with                            mild systemic disease. After reviewing the risks                            and benefits, the patient was deemed in                            satisfactory condition to undergo the procedure.                           After obtaining informed consent, the colonoscope  was passed under direct vision. Throughout the                            procedure, the patient's blood pressure, pulse, and                            oxygen saturations were monitored continuously. The                            Colonoscope was introduced through the anus and                            advanced to the the terminal ileum, with                            identification of the appendiceal orifice  and IC                            valve. The colonoscopy was performed without                            difficulty. The patient tolerated the procedure                            well. The quality of the bowel preparation was                            fair. The terminal ileum, ileocecal valve,                            appendiceal orifice, and rectum were photographed. Scope In: 11:00:27 AM Scope Out: 11:28:26 AM Scope Withdrawal Time: 0 hours 23 minutes 10 seconds  Total Procedure Duration: 0 hours 27 minutes 59 seconds  Findings:                 The perianal and digital rectal examinations were                            normal.                           An infiltrative, polypoid and ulcerated                            non-obstructing large mass was found at the                            ileocecal valve extending into terminal ileum. The                            mass was non-circumferential. The mass measured                            four cm in length. In addition, its diameter  measured thirty mm. No bleeding was present.                            Biopsies were taken with a cold forceps for                            histology.                           The terminal ileum contained a polypoid                            non-obstructing small mass like lesion. The mass                            was non-circumferential. The mass measured one cm                            in length. In addition, its diameter measured five                            mm. No bleeding was present. No stigmata of recent                            bleeding were seen. Biopsies were taken with a cold                            forceps for histology.                           Multiple small and large-mouthed diverticula were                            found in the sigmoid colon, descending colon,                            transverse colon, ascending colon and cecum. There                             was narrowing of the colon in association with the                            diverticular opening. There was evidence of an                            impacted diverticulum. There was no evidence of                            diverticular bleeding.                           Non-bleeding internal hemorrhoids were found during  retroflexion. The hemorrhoids were small.                           Two sessile polyps were found in the rectum and                            transverse colon. The polyps were 3 to 4 mm in                            size. These polyps were removed with a cold snare.                            Resection and retrieval were complete. Complications:            No immediate complications. Estimated Blood Loss:     Estimated blood loss was minimal. Impression:               - Preparation of the colon was fair.                           - Rule out malignancy, tumor at the ileocecal                            valve. Biopsied.                           - Rule out malignancy, tumor in the terminal ileum.                            Biopsied.                           - Severe diverticulosis in the sigmoid colon, in                            the descending colon, in the transverse colon, in                            the ascending colon and in the cecum. There was                            narrowing of the colon in association with the                            diverticular opening. There was evidence of an                            impacted diverticulum. There was no evidence of                            diverticular bleeding.                           - Non-bleeding internal hemorrhoids.                           -  Two 3 to 4 mm polyps in the rectum and in the                            transverse colon, removed with a cold snare.                            Resected and retrieved. Recommendation:           - Patient has  a contact number available for                            emergencies. The signs and symptoms of potential                            delayed complications were discussed with the                            patient. Return to normal activities tomorrow.                            Written discharge instructions were provided to the                            patient.                           - Resume previous diet.                           - Continue present medications.                           - Await pathology results.                           - Repeat colonoscopy date to be determined after                            pending pathology results are reviewed for                            surveillance based on pathology results. Mauri Pole, MD 08/18/2017 11:41:31 AM This report has been signed electronically.

## 2017-08-18 NOTE — Progress Notes (Signed)
Called to room to assist during endoscopic procedure.  Patient ID and intended procedure confirmed with present staff. Received instructions for my participation in the procedure from the performing physician.  

## 2017-08-18 NOTE — Patient Instructions (Signed)
YOU HAD AN ENDOSCOPIC PROCEDURE TODAY AT [facility]:   Refer to the procedure report that was given to you for any specific questions about what was found during the examination.  If the procedure report does not answer your questions, please call your gastroenterologist to clarify.  If you requested that your care partner not be given the details of your procedure findings, then the procedure report has been included in a sealed envelope for you to review at your convenience later.  YOU SHOULD EXPECT: Some feelings of bloating in the abdomen. Passage of more gas than usual.  Walking can help get rid of the air that was put into your GI tract during the procedure and reduce the bloating. If you had a lower endoscopy (such as a colonoscopy or flexible sigmoidoscopy) you may notice spotting of blood in your stool or on the toilet paper. If you underwent a bowel prep for your procedure, you may not have a normal bowel movement for a few days.  Please Note:  You might notice some irritation and congestion in your nose or some drainage.  This is from the oxygen used during your procedure.  There is no need for concern and it should clear up in a day or so.  SYMPTOMS TO REPORT IMMEDIATELY:   Following lower endoscopy (colonoscopy or flexible sigmoidoscopy):  Excessive amounts of blood in the stool  Significant tenderness or worsening of abdominal pains  Swelling of the abdomen that is new, acute  Fever of 100F or higher   For urgent or emergent issues, a gastroenterologist can be reached at any hour by calling [phone].   DIET:  We do recommend a small meal at first, but then you may proceed to your regular diet.  Drink plenty of fluids but you should avoid alcoholic beverages for 24 hours.  ACTIVITY:  You should plan to take it easy for the rest of today and you should NOT DRIVE or use heavy machinery until tomorrow (because of the sedation medicines used during the test).     FOLLOW UP: Our staff will call the number listed on your records the next business day following your procedure to check on you and address any questions or concerns that you may have regarding the information given to you following your procedure. If we do not reach you, we will leave a message.  However, if you are feeling well and you are not experiencing any problems, there is no need to return our call.  We will assume that you have returned to your regular daily activities without incident.  If any biopsies were taken you will be contacted by phone or by letter within the next 1-3 weeks.  Please call us at [phone] if you have not heard about the biopsies in 3 weeks.    SIGNATURES/CONFIDENTIALITY: You and/or your care partner have signed paperwork which will be entered into your electronic medical record.  These signatures attest to the fact that that the information above on your After Visit Summary has been reviewed and is understood.  Full responsibility of the confidentiality of this discharge information lies with   Polyps, diverticulosis and hemorrhoid information given,   you and/or your care-partner.

## 2017-08-19 ENCOUNTER — Telehealth: Payer: Self-pay

## 2017-08-19 NOTE — Telephone Encounter (Signed)
  Follow up Call-  Call back number 08/18/2017  Post procedure Call Back phone  # [phone]  Permission to leave phone message Yes  Some recent data might be hidden     Patient questions:  Do you have a fever, pain , or abdominal swelling? No. Pain Score  0 *  Have you tolerated food without any problems? Yes.    Have you been able to return to your normal activities? Yes.    Do you have any questions about your discharge instructions: Diet   No. Medications  No. Follow up visit  No.  Do you have questions or concerns about your Care? No.  Actions: * If pain score is 4 or above: No action needed, pain <4.

## 2017-08-20 ENCOUNTER — Other Ambulatory Visit: Payer: Self-pay

## 2017-08-20 ENCOUNTER — Telehealth: Payer: Self-pay

## 2017-08-20 DIAGNOSIS — C7A8 Other malignant neuroendocrine tumors: Secondary | ICD-10-CM

## 2017-08-20 NOTE — Telephone Encounter (Signed)
I have spoken with the patient. She will have an MRI of the abd/pelvis with and without contrast 08/26/17.  She is uninsured. She may not be able to see a Garment/textile technologist. I will make the referral and see if [facility] can take anymore uninsured patients. Left the patient a message to call back to discuss other options.

## 2017-08-20 NOTE — Telephone Encounter (Signed)
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 The smaller lesion in terminal ileum biopsies were inconclusive, was hard to obtain good samples due to the location and also the polypoid lesion was very firm, was bouncing the forceps off. I believe the terminal ileum polypoid lesion is also neuroendocrine tumor.  Please schedule for MRI with contrast to exclude any liver metastasis. Refer to surgery , Dr Leighton Ruff for surgical resection.

## 2017-08-20 NOTE — Telephone Encounter (Signed)
Can we send referral to [facility]? Thanks

## 2017-08-23 NOTE — Telephone Encounter (Signed)
Spoke with the surgical coordinator. This case will be better served by Surgical Oncology. She will forward the records for [place]. The contact will be Doran Durand [phone].

## 2017-08-23 NOTE — Telephone Encounter (Signed)
Contacted the patient and discussed this with her. She is okay with trying the referral. [facility] has a Geneticist, molecular. Records faxed to [phone]

## 2017-08-26 ENCOUNTER — Ambulatory Visit
Admission: RE | Admit: 2017-08-26 | Discharge: 2017-08-26 | Disposition: A | Discharge status: Home / Self Care | Payer: No Typology Code available for payment source | Source: Ambulatory Visit | Attending: Gastroenterology | Admitting: Gastroenterology

## 2017-08-26 DIAGNOSIS — C7A8 Other malignant neuroendocrine tumors: Secondary | ICD-10-CM

## 2017-08-26 MED ORDER — GADOBENATE DIMEGLUMINE 529 MG/ML IV SOLN
10.0000 mL | Freq: Once | INTRAVENOUS | Status: AC | PRN
  Administered 2017-08-26: 10 mL via INTRAVENOUS

## 2017-08-27 NOTE — Telephone Encounter (Signed)
Appointment with surgical oncology 08/30/17

## 2017-09-01 NOTE — Telephone Encounter (Signed)
Left message for the patient. Asking her to call and let us know if she has been contacted by the Surgical Oncologists?

## 2018-09-09 ENCOUNTER — Encounter: Payer: Self-pay | Admitting: Gastroenterology

## 2018-09-14 ENCOUNTER — Encounter (HOSPITAL_COMMUNITY): Payer: Self-pay

## 2018-09-14 ENCOUNTER — Ambulatory Visit (HOSPITAL_COMMUNITY)
Admission: EM | Admit: 2018-09-14 | Discharge: 2018-09-14 | Disposition: A | Discharge status: Home / Self Care | Payer: Self-pay | Attending: Family Medicine | Admitting: Family Medicine

## 2018-09-14 DIAGNOSIS — R1031 Right lower quadrant pain: Secondary | ICD-10-CM

## 2018-09-14 DIAGNOSIS — Z859 Personal history of malignant neoplasm, unspecified: Secondary | ICD-10-CM

## 2018-09-14 DIAGNOSIS — R195 Other fecal abnormalities: Secondary | ICD-10-CM
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
## 2018-09-14 NOTE — ED Triage Notes (Signed)
Pt presents with lower right quadrant pain for about a week.  Pt had a tumor removed from that area about a year ago.

## 2018-09-14 NOTE — Discharge Instructions (Signed)
Recommending further evaluation and management in the ED given hx of neuroendocrine tumor, RLQ pain, and dark tarry stools x 1 week.  Patient aware and in agreement with this plan.  Will go to [facility] with friend by private vehicle.

## 2018-09-15 ENCOUNTER — Emergency Department (HOSPITAL_COMMUNITY)
Admission: EM | Admit: 2018-09-15 | Discharge: 2018-09-15 | Disposition: A | Discharge status: Home / Self Care | Payer: Self-pay | Attending: Emergency Medicine | Admitting: Emergency Medicine

## 2018-09-15 ENCOUNTER — Other Ambulatory Visit: Payer: Self-pay

## 2018-09-15 ENCOUNTER — Encounter (HOSPITAL_COMMUNITY): Payer: Self-pay

## 2018-09-15 ENCOUNTER — Emergency Department (HOSPITAL_COMMUNITY): Payer: Self-pay

## 2018-09-15 DIAGNOSIS — I10 Essential (primary) hypertension: Secondary | ICD-10-CM | POA: Insufficient documentation

## 2018-09-15 DIAGNOSIS — F17210 Nicotine dependence, cigarettes, uncomplicated: Secondary | ICD-10-CM | POA: Insufficient documentation

## 2018-09-15 DIAGNOSIS — R1031 Right lower quadrant pain: Secondary | ICD-10-CM | POA: Insufficient documentation

## 2018-09-15 LAB — CBC
HCT: 42.3 % (ref 36.0–46.0)
Hemoglobin: 14 g/dL (ref 12.0–15.0)
MCH: 31.3 pg (ref 26.0–34.0)
MCHC: 33.1 g/dL (ref 30.0–36.0)
MCV: 94.6 fL (ref 80.0–100.0)
PLATELETS: 373 10*3/uL (ref 150–400)
RBC: 4.47 MIL/uL (ref 3.87–5.11)
RDW: 13.6 % (ref 11.5–15.5)
WBC: 6.3 10*3/uL (ref 4.0–10.5)
nRBC: 0 % (ref 0.0–0.2)

## 2018-09-15 LAB — COMPREHENSIVE METABOLIC PANEL
ALT: 30 U/L (ref 0–44)
AST: 25 U/L (ref 15–41)
Albumin: 3.9 g/dL (ref 3.5–5.0)
Alkaline Phosphatase: 67 U/L (ref 38–126)
Anion gap: 10 (ref 5–15)
BUN: 15 mg/dL (ref 8–23)
CO2: 31 mmol/L (ref 22–32)
Calcium: 9.8 mg/dL (ref 8.9–10.3)
Chloride: 99 mmol/L (ref 98–111)
Creatinine, Ser: 1.38 mg/dL — ABNORMAL HIGH (ref 0.44–1.00)
GFR calc Af Amer: 48 mL/min — ABNORMAL LOW (ref >60)
GFR, EST NON AFRICAN AMERICAN: 41 mL/min — AB (ref >60)
Glucose, Bld: 73 mg/dL (ref 70–99)
Potassium: 2.7 mmol/L — CL (ref 3.5–5.1)
Sodium: 140 mmol/L (ref 135–145)
Total Bilirubin: 0.6 mg/dL (ref 0.3–1.2)
Total Protein: 7.3 g/dL (ref 6.5–8.1)

## 2018-09-15 LAB — POC OCCULT BLOOD, ED: Fecal Occult Bld: NEGATIVE

## 2018-09-15 LAB — TYPE AND SCREEN
ABO/RH(D): O POS
Antibody Screen: NEGATIVE

## 2018-09-15 LAB — ABO/RH: ABO/RH(D): O POS

## 2018-09-15 LAB — LIPASE, BLOOD: LIPASE: 36 U/L (ref 11–51)

## 2018-09-15 MED ORDER — SODIUM CHLORIDE 0.9 % IV BOLUS
1000.0000 mL | Freq: Once | INTRAVENOUS | Status: AC
  Administered 2018-09-15: 1000 mL via INTRAVENOUS

## 2018-09-15 MED ORDER — IOHEXOL 300 MG/ML  SOLN
100.0000 mL | Freq: Once | INTRAMUSCULAR | Status: AC | PRN
  Administered 2018-09-15: 100 mL via INTRAVENOUS

## 2018-09-15 MED ORDER — POTASSIUM CHLORIDE CRYS ER 20 MEQ PO TBCR
40.0000 meq | EXTENDED_RELEASE_TABLET | Freq: Once | ORAL | Status: AC
  Administered 2018-09-15: 40 meq via ORAL
  Filled 2018-09-15: qty 2

## 2018-09-15 MED ORDER — ONDANSETRON HCL 4 MG/2ML IJ SOLN
4.0000 mg | Freq: Once | INTRAMUSCULAR | Status: AC
  Administered 2018-09-15: 4 mg via INTRAVENOUS
  Filled 2018-09-15: qty 2

## 2018-09-15 MED ORDER — FENTANYL CITRATE (PF) 100 MCG/2ML IJ SOLN
25.0000 ug | Freq: Once | INTRAMUSCULAR | Status: AC
  Administered 2018-09-15: 25 ug via INTRAVENOUS
  Filled 2018-09-15: qty 2

## 2018-09-15 NOTE — Discharge Instructions (Addendum)
You were evaluated in the Emergency Department and after careful evaluation, we did not find any emergent condition requiring admission or further testing in the hospital.  Your labs and CT scan today were overall reassuring.  You have low potassium and you should include more in your diet.  It is still important that you follow-up with your regular doctors to obtain a repeat MRI of your abdomen.  Please return to the Emergency Department if you experience any worsening of your condition.  We encourage you to follow up with a primary care provider.  Thank you for allowing [place] to be a part of your care.

## 2018-09-15 NOTE — ED Notes (Signed)
Lab called this Rn and gave critical potassium 2.7 to this RN, primary RN and MD notified.

## 2018-09-15 NOTE — ED Notes (Signed)
Patient verbalizes understanding of discharge instructions. Opportunity for questioning and answers were provided. Armband removed by staff, pt discharged from ED.  

## 2018-09-15 NOTE — ED Notes (Signed)
Notified EDP and primary RN of low potassium of 2.7.
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
## 2018-09-15 NOTE — ED Triage Notes (Signed)
Pt reports RLQ abd pain that has worsened, now accompanied with dark stools starting last night. States she had a tumor removed in RLQ area last year. Denies any stools today or any bright red blood from rectum. Took advil today to assist with pain, which she reports did help some. Pain 8/10 currently, denies N/V or fevers.

## 2019-03-07 ENCOUNTER — Encounter (HOSPITAL_COMMUNITY): Payer: Self-pay

## 2019-03-07 ENCOUNTER — Ambulatory Visit (INDEPENDENT_AMBULATORY_CARE_PROVIDER_SITE_OTHER): Payer: Self-pay

## 2019-03-07 ENCOUNTER — Other Ambulatory Visit: Payer: Self-pay

## 2019-03-07 ENCOUNTER — Ambulatory Visit (HOSPITAL_COMMUNITY)
Admission: EM | Admit: 2019-03-07 | Discharge: 2019-03-07 | Disposition: A | Discharge status: Home / Self Care | Payer: Self-pay | Attending: Family Medicine | Admitting: Family Medicine

## 2019-03-07 DIAGNOSIS — S93492A Sprain of other ligament of left ankle, initial encounter: Secondary | ICD-10-CM

## 2019-03-07 DIAGNOSIS — S93602A Unspecified sprain of left foot, initial encounter: Secondary | ICD-10-CM
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
## 2019-03-07 NOTE — ED Triage Notes (Signed)
Pt presents for left foot pain after a fall on Sunday.  Pt has visible swelling, discoloration and slight deformity.

## 2019-03-07 NOTE — Discharge Instructions (Addendum)
If not allergie, you may use over the counter ibuprofen or acetaminophen as needed for pain.

## 2019-08-07 ENCOUNTER — Encounter: Payer: Self-pay | Admitting: Gastroenterology

## 2019-08-07 ENCOUNTER — Encounter (HOSPITAL_COMMUNITY): Payer: Self-pay | Admitting: Emergency Medicine

## 2019-08-07 ENCOUNTER — Other Ambulatory Visit: Payer: Self-pay

## 2019-08-07 ENCOUNTER — Emergency Department (HOSPITAL_COMMUNITY)
Admission: EM | Admit: 2019-08-07 | Discharge: 2019-08-07 | Disposition: A | Discharge status: Home / Self Care | Payer: Self-pay | Attending: Emergency Medicine | Admitting: Emergency Medicine

## 2019-08-07 ENCOUNTER — Ambulatory Visit (INDEPENDENT_AMBULATORY_CARE_PROVIDER_SITE_OTHER): Payer: Self-pay | Admitting: Gastroenterology

## 2019-08-07 ENCOUNTER — Other Ambulatory Visit (INDEPENDENT_AMBULATORY_CARE_PROVIDER_SITE_OTHER): Payer: Self-pay

## 2019-08-07 VITALS — BP 100/60 | HR 84 | Temp 98.8°F | Ht 61.25 in | Wt 110.0 lb

## 2019-08-07 DIAGNOSIS — R1031 Right lower quadrant pain: Secondary | ICD-10-CM

## 2019-08-07 DIAGNOSIS — I10 Essential (primary) hypertension: Secondary | ICD-10-CM | POA: Insufficient documentation

## 2019-08-07 DIAGNOSIS — K9089 Other intestinal malabsorption: Secondary | ICD-10-CM

## 2019-08-07 DIAGNOSIS — R634 Abnormal weight loss: Secondary | ICD-10-CM

## 2019-08-07 DIAGNOSIS — F17210 Nicotine dependence, cigarettes, uncomplicated: Secondary | ICD-10-CM | POA: Insufficient documentation

## 2019-08-07 DIAGNOSIS — Z9049 Acquired absence of other specified parts of digestive tract: Secondary | ICD-10-CM

## 2019-08-07 DIAGNOSIS — Z859 Personal history of malignant neoplasm, unspecified: Secondary | ICD-10-CM

## 2019-08-07 DIAGNOSIS — K6389 Other specified diseases of intestine: Secondary | ICD-10-CM

## 2019-08-07 DIAGNOSIS — R197 Diarrhea, unspecified: Secondary | ICD-10-CM

## 2019-08-07 DIAGNOSIS — E876 Hypokalemia: Secondary | ICD-10-CM | POA: Insufficient documentation

## 2019-08-07 DIAGNOSIS — Z79899 Other long term (current) drug therapy: Secondary | ICD-10-CM | POA: Insufficient documentation

## 2019-08-07 LAB — CBC WITH DIFFERENTIAL/PLATELET
Basophils Absolute: 0.1 10*3/uL (ref 0.0–0.1)
Basophils Relative: 1.3 % (ref 0.0–3.0)
Eosinophils Absolute: 0.2 10*3/uL (ref 0.0–0.7)
Eosinophils Relative: 2.6 % (ref 0.0–5.0)
HCT: 39 % (ref 36.0–46.0)
Hemoglobin: 13.3 g/dL (ref 12.0–15.0)
Lymphocytes Relative: 42.5 % (ref 12.0–46.0)
Lymphs Abs: 3.6 10*3/uL (ref 0.7–4.0)
MCHC: 34.1 g/dL (ref 30.0–36.0)
MCV: 93.5 fl (ref 78.0–100.0)
Monocytes Absolute: 0.5 10*3/uL (ref 0.1–1.0)
Monocytes Relative: 5.8 % (ref 3.0–12.0)
Neutro Abs: 4.1 10*3/uL (ref 1.4–7.7)
Neutrophils Relative %: 47.8 % (ref 43.0–77.0)
Platelets: 374 10*3/uL (ref 150.0–400.0)
RBC: 4.18 Mil/uL (ref 3.87–5.11)
RDW: 14.6 % (ref 11.5–15.5)
WBC: 8.5 10*3/uL (ref 4.0–10.5)

## 2019-08-07 LAB — COMPREHENSIVE METABOLIC PANEL
ALT: 14 U/L (ref 0–35)
AST: 20 U/L (ref 0–37)
Albumin: 4.3 g/dL (ref 3.5–5.2)
Alkaline Phosphatase: 62 U/L (ref 39–117)
BUN: 18 mg/dL (ref 6–23)
CO2: 31 mEq/L (ref 19–32)
Calcium: 10 mg/dL (ref 8.4–10.5)
Chloride: 96 mEq/L (ref 96–112)
Creatinine, Ser: 1.27 mg/dL — ABNORMAL HIGH (ref 0.40–1.20)
GFR: 51.57 mL/min — ABNORMAL LOW (ref >60.00)
Glucose, Bld: 93 mg/dL (ref 70–99)
Potassium: 2.8 mEq/L — CL (ref 3.5–5.1)
Sodium: 136 mEq/L (ref 135–145)
Total Bilirubin: 0.6 mg/dL (ref 0.2–1.2)
Total Protein: 7.5 g/dL (ref 6.0–8.3)

## 2019-08-07 MED ORDER — POTASSIUM CHLORIDE 10 MEQ/100ML IV SOLN
10.0000 meq | INTRAVENOUS | Status: AC
  Administered 2019-08-07 (×2): 10 meq via INTRAVENOUS
  Filled 2019-08-07 (×2): qty 100

## 2019-08-07 MED ORDER — COLESTIPOL HCL 1 G PO TABS: 1.0000 g | ORAL_TABLET | Freq: Two times a day (BID) | ORAL | 1 refills | Status: DC

## 2019-08-07 MED ORDER — LACTATED RINGERS IV BOLUS
1000.0000 mL | Freq: Once | INTRAVENOUS | Status: AC
  Administered 2019-08-07: 19:00:00 1000 mL via INTRAVENOUS

## 2019-08-07 MED ORDER — KETOROLAC TROMETHAMINE 15 MG/ML IJ SOLN
15.0000 mg | Freq: Once | INTRAMUSCULAR | Status: AC
  Administered 2019-08-07: 19:00:00 15 mg via INTRAVENOUS
  Filled 2019-08-07: qty 1

## 2019-08-07 MED ORDER — POTASSIUM CHLORIDE CRYS ER 20 MEQ PO TBCR
40.0000 meq | EXTENDED_RELEASE_TABLET | Freq: Once | ORAL | Status: AC
  Administered 2019-08-07: 19:00:00 40 meq via ORAL
  Filled 2019-08-07: qty 2
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
## 2019-08-07 NOTE — Discharge Instructions (Addendum)
Please take your oral potassium as prescribed. Follow up with your GI doctor regarding your imaging coming up. Return to the ED for any worsening symptoms including diarrhea, abdominal pain or fevers.

## 2019-08-07 NOTE — ED Triage Notes (Signed)
Pt sent by GI for k+ 2.8. Denies recent vomiting/diarrhea.

## 2019-08-07 NOTE — Patient Instructions (Addendum)
Start Colestid 1 gram twice daily. Do not take within 3-4 hours of other medications.   We have sent the following medications to your pharmacy for you to pick up at your convenience: Colestid   If you are age 63 or older, your body mass index should be between 23-30. Your Body mass index is 20.61 kg/m. If this is out of the aforementioned range listed, please consider follow up with your Primary Care Provider.  If you are age 60 or younger, your body mass index should be between 19-25. Your Body mass index is 20.61 kg/m. If this is out of the aformentioned range listed, please consider follow up with your Primary Care Provider.    You have been scheduled for an Dotatate Scan at [facility] on 08/17/2019. Your appointment time is 7:00am. Please arrive 30 minutes prior to your appointment time for registration purposes. Please make certain not to have anything to eat or drink after midnight the night before test. In addition, if you have any metal in your body, have a pacemaker or defibrillator, please be sure to let your ordering physician know. This test typically takes 45 min to 1.5 hours to complete. Should you need to reschedule, please call [phone] to do so.  Your provider has requested that you go to the basement level for lab work before leaving today. Press "B" on the elevator. The lab is located at the first door on the left as you exit the elevator.  Due to recent changes in healthcare laws, you may see the results of your imaging and laboratory studies on MyChart before your provider has had a chance to review them.  We understand that in some cases there may be results that are confusing or concerning to you. Not all laboratory results come back in the same time frame and the provider may be waiting for multiple results in order to interpret others.  Please give [place] 48 hours in order for your provider to thoroughly review all the results before contacting the office for clarification of your  results.   Thank you for choosing me and [facility].  Dr. Silverio Decamp

## 2019-08-07 NOTE — ED Notes (Signed)
IV potassium infusing per MAR. No distress noted. Will continue to monitor. Pt conscious, breathing, and A&Ox4.

## 2019-08-08 ENCOUNTER — Telehealth: Payer: Self-pay

## 2019-08-08 NOTE — Telephone Encounter (Signed)
-----   Message from Mauri Pole, MD sent at 08/08/2019 11:47 AM EST ----- Please send prescription for oral potassium 20 mEq daily.  Recheck BMP on Friday, January 29.  Thanks ----- Message ----- From: Greggory Keen, LPN Sent: 11/03/5359  10:23 AM EST To: Mauri Pole, MD  She did go to the ED.  Did you want to put her on any PO K+? Thanks

## 2019-08-08 NOTE — Telephone Encounter (Signed)
Called the patient. No answer. Left a message on her voicemail to call me back today.

## 2019-08-09 ENCOUNTER — Other Ambulatory Visit: Payer: Self-pay | Admitting: Gastroenterology

## 2019-08-09 ENCOUNTER — Other Ambulatory Visit: Payer: Self-pay

## 2019-08-09 DIAGNOSIS — E876 Hypokalemia: Secondary | ICD-10-CM

## 2019-08-09 DIAGNOSIS — R197 Diarrhea, unspecified: Secondary | ICD-10-CM

## 2019-08-09 MED ORDER — POTASSIUM CHLORIDE CRYS ER 20 MEQ PO TBCR: 20.0000 meq | EXTENDED_RELEASE_TABLET | Freq: Every day | ORAL | 0 refills | Status: DC

## 2019-08-09 NOTE — Telephone Encounter (Signed)
Spoke with the daughter. Rx to [facility] as listed. Labs this Friday.

## 2019-08-14 ENCOUNTER — Ambulatory Visit (HOSPITAL_COMMUNITY): Payer: Self-pay

## 2019-08-15 ENCOUNTER — Telehealth: Payer: Self-pay

## 2019-08-15 DIAGNOSIS — Z859 Personal history of malignant neoplasm, unspecified: Secondary | ICD-10-CM

## 2019-08-15 NOTE — Telephone Encounter (Signed)
Dotatate Scan was cancelled. Pt has an [employer], but per [employer] in [place] orange card does not cover this service. Pt was expected to pay 50% up front ( [phone]) in order to have Scan done. Dotatate scan was cancelled as pt could not afford to pay that amount up front. Information was given to [place].

## 2019-08-15 NOTE — Telephone Encounter (Signed)
Thank you for letting me know Rovonda.   Can you please send referral to oncology to discuss surveillance and follow-up for neuroendocrine tumor given she cannot undergo dotatate scan.

## 2019-08-16 NOTE — Telephone Encounter (Signed)
Referral sent to Oncology. Pt's daughter informed.

## 2019-08-17 ENCOUNTER — Ambulatory Visit (HOSPITAL_COMMUNITY): Admission: RE | Admit: 2019-08-17 | Payer: Self-pay | Source: Ambulatory Visit
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
## 2019-08-30 ENCOUNTER — Inpatient Hospital Stay: Payer: Self-pay

## 2019-08-30 ENCOUNTER — Telehealth: Payer: Self-pay

## 2019-08-30 ENCOUNTER — Inpatient Hospital Stay: Payer: Self-pay | Attending: Nurse Practitioner | Admitting: Nurse Practitioner

## 2019-08-30 ENCOUNTER — Other Ambulatory Visit: Payer: Self-pay

## 2019-08-30 ENCOUNTER — Encounter: Payer: Self-pay | Admitting: Nurse Practitioner

## 2019-08-30 VITALS — BP 130/80 | HR 59 | Temp 98.2°F | Resp 18 | Ht 63.0 in | Wt 107.0 lb

## 2019-08-30 DIAGNOSIS — C7A012 Malignant carcinoid tumor of the ileum: Secondary | ICD-10-CM | POA: Insufficient documentation

## 2019-08-30 DIAGNOSIS — Z7984 Long term (current) use of oral hypoglycemic drugs: Secondary | ICD-10-CM | POA: Insufficient documentation

## 2019-08-30 DIAGNOSIS — Z86010 Personal history of colonic polyps: Secondary | ICD-10-CM | POA: Insufficient documentation

## 2019-08-30 DIAGNOSIS — Z79899 Other long term (current) drug therapy: Secondary | ICD-10-CM | POA: Insufficient documentation

## 2019-08-30 DIAGNOSIS — E7800 Pure hypercholesterolemia, unspecified: Secondary | ICD-10-CM | POA: Insufficient documentation

## 2019-08-30 DIAGNOSIS — R232 Flushing: Secondary | ICD-10-CM | POA: Insufficient documentation

## 2019-08-30 DIAGNOSIS — F17210 Nicotine dependence, cigarettes, uncomplicated: Secondary | ICD-10-CM | POA: Insufficient documentation

## 2019-08-30 DIAGNOSIS — I10 Essential (primary) hypertension: Secondary | ICD-10-CM | POA: Insufficient documentation

## 2019-08-30 DIAGNOSIS — K5730 Diverticulosis of large intestine without perforation or abscess without bleeding: Secondary | ICD-10-CM | POA: Insufficient documentation

## 2019-08-30 DIAGNOSIS — Z801 Family history of malignant neoplasm of trachea, bronchus and lung: Secondary | ICD-10-CM | POA: Insufficient documentation

## 2019-08-30 LAB — CMP (CANCER CENTER ONLY)
ALT: 15 U/L (ref 0–44)
AST: 17 U/L (ref 15–41)
Albumin: 4.2 g/dL (ref 3.5–5.0)
Alkaline Phosphatase: 65 U/L (ref 38–126)
Anion gap: 9 (ref 5–15)
BUN: 20 mg/dL (ref 8–23)
CO2: 30 mmol/L (ref 22–32)
Calcium: 10.1 mg/dL (ref 8.9–10.3)
Chloride: 104 mmol/L (ref 98–111)
Creatinine: 1.2 mg/dL — ABNORMAL HIGH (ref 0.44–1.00)
GFR, Est AFR Am: 56 mL/min — ABNORMAL LOW (ref >60)
GFR, Estimated: 48 mL/min — ABNORMAL LOW (ref >60)
Glucose, Bld: 91 mg/dL (ref 70–99)
Potassium: 3 mmol/L — CL (ref 3.5–5.1)
Sodium: 143 mmol/L (ref 135–145)
Total Bilirubin: 0.4 mg/dL (ref 0.3–1.2)
Total Protein: 8.1 g/dL (ref 6.5–8.1)

## 2019-08-30 NOTE — Telephone Encounter (Signed)
Unable to reach patient to let her know that her potassium level today was 3.0. Left a VM stating that per Lacie Burton-NP: if she is not taking her potassium supplement, she needs to resume taking 1 tablet twice a day. If she is already taking the supplement BID, then for the next 3 days (Thurs, Fri, Sat) she needs to take 1 tablet three times a day, and then resume taking 1 tablet twice a day starting Sunday. Encouraged her to call our office back to let us know she received my message and make sure she didn't have any questions/cocnerns.

## 2019-09-01 LAB — CHROMOGRANIN A: Chromogranin A (ng/mL): 140.8 ng/mL — ABNORMAL HIGH (ref 0.0–101.8)

## 2019-09-05 ENCOUNTER — Other Ambulatory Visit: Payer: Self-pay

## 2019-09-05 DIAGNOSIS — C7A012 Malignant carcinoid tumor of the ileum: Secondary | ICD-10-CM

## 2019-09-06 ENCOUNTER — Telehealth: Payer: Self-pay | Admitting: Nurse Practitioner

## 2019-09-06 NOTE — Telephone Encounter (Signed)
R/s appt per 2/23 sch message - unable to reach pt - left message for patient with appt date and time

## 2019-09-13 ENCOUNTER — Inpatient Hospital Stay: Payer: Self-pay | Attending: Hematology

## 2019-09-13 ENCOUNTER — Telehealth: Payer: Self-pay | Admitting: Nurse Practitioner

## 2019-09-13 ENCOUNTER — Other Ambulatory Visit: Payer: Self-pay

## 2019-09-13 ENCOUNTER — Ambulatory Visit: Payer: Self-pay | Admitting: Nurse Practitioner

## 2019-09-13 DIAGNOSIS — C7B8 Other secondary neuroendocrine tumors: Secondary | ICD-10-CM | POA: Insufficient documentation

## 2019-09-13 DIAGNOSIS — Z7984 Long term (current) use of oral hypoglycemic drugs: Secondary | ICD-10-CM | POA: Insufficient documentation

## 2019-09-13 DIAGNOSIS — K862 Cyst of pancreas: Secondary | ICD-10-CM | POA: Insufficient documentation

## 2019-09-13 DIAGNOSIS — M5136 Other intervertebral disc degeneration, lumbar region: Secondary | ICD-10-CM | POA: Insufficient documentation

## 2019-09-13 DIAGNOSIS — I10 Essential (primary) hypertension: Secondary | ICD-10-CM | POA: Insufficient documentation

## 2019-09-13 DIAGNOSIS — D250 Submucous leiomyoma of uterus: Secondary | ICD-10-CM | POA: Insufficient documentation

## 2019-09-13 DIAGNOSIS — E7800 Pure hypercholesterolemia, unspecified: Secondary | ICD-10-CM | POA: Insufficient documentation

## 2019-09-13 DIAGNOSIS — I700 Atherosclerosis of aorta: Secondary | ICD-10-CM | POA: Insufficient documentation

## 2019-09-13 DIAGNOSIS — I2510 Atherosclerotic heart disease of native coronary artery without angina pectoris: Secondary | ICD-10-CM | POA: Insufficient documentation

## 2019-09-13 DIAGNOSIS — M79604 Pain in right leg: Secondary | ICD-10-CM | POA: Insufficient documentation

## 2019-09-13 DIAGNOSIS — R7303 Prediabetes: Secondary | ICD-10-CM | POA: Insufficient documentation

## 2019-09-13 DIAGNOSIS — C7A012 Malignant carcinoid tumor of the ileum: Secondary | ICD-10-CM | POA: Insufficient documentation

## 2019-09-13 LAB — CBC WITH DIFFERENTIAL (CANCER CENTER ONLY)
Abs Immature Granulocytes: 0.02 10*3/uL (ref 0.00–0.07)
Basophils Absolute: 0.1 10*3/uL (ref 0.0–0.1)
Basophils Relative: 1 %
Eosinophils Absolute: 0.2 10*3/uL (ref 0.0–0.5)
Eosinophils Relative: 2 %
HCT: 39.9 % (ref 36.0–46.0)
Hemoglobin: 13.8 g/dL (ref 12.0–15.0)
Immature Granulocytes: 0 %
Lymphocytes Relative: 39 %
Lymphs Abs: 3.4 10*3/uL (ref 0.7–4.0)
MCH: 32.2 pg (ref 26.0–34.0)
MCHC: 34.6 g/dL (ref 30.0–36.0)
MCV: 93 fL (ref 80.0–100.0)
Monocytes Absolute: 0.5 10*3/uL (ref 0.1–1.0)
Monocytes Relative: 6 %
Neutro Abs: 4.5 10*3/uL (ref 1.7–7.7)
Neutrophils Relative %: 52 %
Platelet Count: 421 10*3/uL — ABNORMAL HIGH (ref 150–400)
RBC: 4.29 MIL/uL (ref 3.87–5.11)
RDW: 14.1 % (ref 11.5–15.5)
WBC Count: 8.6 10*3/uL (ref 4.0–10.5)
nRBC: 0 % (ref 0.0–0.2)

## 2019-09-13 LAB — CMP (CANCER CENTER ONLY)
ALT: 15 U/L (ref 0–44)
AST: 23 U/L (ref 15–41)
Albumin: 4.1 g/dL (ref 3.5–5.0)
Alkaline Phosphatase: 72 U/L (ref 38–126)
Anion gap: 9 (ref 5–15)
BUN: 20 mg/dL (ref 8–23)
CO2: 30 mmol/L (ref 22–32)
Calcium: 9.9 mg/dL (ref 8.9–10.3)
Chloride: 100 mmol/L (ref 98–111)
Creatinine: 1.23 mg/dL — ABNORMAL HIGH (ref 0.44–1.00)
GFR, Est AFR Am: 54 mL/min — ABNORMAL LOW (ref >60)
GFR, Estimated: 47 mL/min — ABNORMAL LOW (ref >60)
Glucose, Bld: 78 mg/dL (ref 70–99)
Potassium: 3 mmol/L — CL (ref 3.5–5.1)
Sodium: 139 mmol/L (ref 135–145)
Total Bilirubin: 0.5 mg/dL (ref 0.3–1.2)
Total Protein: 8 g/dL (ref 6.5–8.1)

## 2019-09-13 NOTE — Telephone Encounter (Signed)
I called Meredith Goodman to review her labs. K 3.0, taking oral K once daily. She reports having diarrhea once per day. I recommend to take OTC imodium and increase oral K to 1 tab TID. She agrees, verbalizes understanding and appreciates the call.   She is aware of her scheduled CT on 3/4 and phone visit 3/8. Denies other needs at this time.   Cira Rue, NP

## 2019-09-13 NOTE — Progress Notes (Signed)
CRITICAL VALUE ALERT  Critical Value:  Potassium 3.0  Date & Time Notied:  09/13/19 15:59  Provider Notified: Regan Rakers Burton-NP  Orders Received/Actions taken: Lacie will call patient to see if she is taking oral potassium as ordered or if dose needs to be adjusted

## 2019-09-14 ENCOUNTER — Ambulatory Visit (HOSPITAL_COMMUNITY)
Admission: RE | Admit: 2019-09-14 | Discharge: 2019-09-14 | Disposition: A | Discharge status: Home / Self Care | Payer: Self-pay | Source: Ambulatory Visit | Attending: Nurse Practitioner | Admitting: Nurse Practitioner

## 2019-09-14 ENCOUNTER — Encounter (HOSPITAL_COMMUNITY): Payer: Self-pay

## 2019-09-14 ENCOUNTER — Other Ambulatory Visit: Payer: Self-pay

## 2019-09-14 DIAGNOSIS — C7A012 Malignant carcinoid tumor of the ileum: Secondary | ICD-10-CM | POA: Insufficient documentation

## 2019-09-14 MED ORDER — SODIUM CHLORIDE (PF) 0.9 % IJ SOLN
INTRAMUSCULAR | Status: AC
  Filled 2019-09-14: qty 50

## 2019-09-14 MED ORDER — IOHEXOL 300 MG/ML  SOLN
100.0000 mL | Freq: Once | INTRAMUSCULAR | Status: AC | PRN
  Administered 2019-09-14: 100 mL via INTRAVENOUS

## 2019-09-18 ENCOUNTER — Inpatient Hospital Stay (HOSPITAL_BASED_OUTPATIENT_CLINIC_OR_DEPARTMENT_OTHER): Payer: Self-pay | Admitting: Nurse Practitioner

## 2019-09-18 ENCOUNTER — Other Ambulatory Visit: Payer: Self-pay

## 2019-09-18 ENCOUNTER — Encounter: Payer: Self-pay | Admitting: Nurse Practitioner

## 2019-09-18 VITALS — BP 144/94 | HR 83 | Temp 97.8°F | Resp 18 | Ht 63.0 in | Wt 108.2 lb

## 2019-09-18 DIAGNOSIS — M79604 Pain in right leg: Secondary | ICD-10-CM

## 2019-09-18 DIAGNOSIS — C7A012 Malignant carcinoid tumor of the ileum: Secondary | ICD-10-CM

## 2019-09-18 DIAGNOSIS — E876 Hypokalemia: Secondary | ICD-10-CM

## 2019-09-18 MED ORDER — POTASSIUM CHLORIDE CRYS ER 20 MEQ PO TBCR: 20.0000 meq | EXTENDED_RELEASE_TABLET | Freq: Two times a day (BID) | ORAL | 0 refills | Status: DC
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
## 2019-09-19 ENCOUNTER — Telehealth: Payer: Self-pay | Admitting: Nurse Practitioner

## 2019-09-19 NOTE — Telephone Encounter (Signed)
Scheduled appt per 3/8 los.  Left a vm of the appt date and time.

## 2019-09-20 ENCOUNTER — Other Ambulatory Visit: Payer: Self-pay | Admitting: Nurse Practitioner

## 2019-11-06 ENCOUNTER — Ambulatory Visit: Payer: Self-pay | Admitting: Gastroenterology

## 2019-11-28 DIAGNOSIS — E785 Hyperlipidemia, unspecified: Secondary | ICD-10-CM | POA: Insufficient documentation

## 2019-11-29 ENCOUNTER — Encounter: Payer: Self-pay | Admitting: Gastroenterology

## 2019-11-29 ENCOUNTER — Ambulatory Visit (INDEPENDENT_AMBULATORY_CARE_PROVIDER_SITE_OTHER): Payer: Self-pay | Admitting: Gastroenterology

## 2019-11-29 VITALS — BP 128/76 | HR 71 | Ht 63.0 in | Wt 107.4 lb

## 2019-11-29 DIAGNOSIS — R1031 Right lower quadrant pain: Secondary | ICD-10-CM

## 2019-11-29 MED ORDER — COLESTIPOL HCL 1 G PO TABS: 1.0000 g | ORAL_TABLET | Freq: Two times a day (BID) | ORAL | 11 refills | Status: DC
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
## 2019-11-29 NOTE — Patient Instructions (Addendum)
If you are age 63 or older, your body mass index should be between 23-30. Your Body mass index is 19.03 kg/m. If this is out of the aforementioned range listed, please consider follow up with your Primary Care Provider.  If you are age 30 or younger, your body mass index should be between 19-25. Your Body mass index is 19.03 kg/m. If this is out of the aformentioned range listed, please consider follow up with your Primary Care Provider.     We have sent the following medications to your pharmacy for you to pick up at your convenience:Colestid.   You have been scheduled for a colonoscopy. Please follow written instructions given to you at your visit today.  Please pick up your prep supplies at the pharmacy within the next 1-3 days. If you use inhalers (even only as needed), please bring them with you on the day of your procedure.  Due to recent changes in healthcare laws, you may see the results of your imaging and laboratory studies on MyChart before your provider has had a chance to review them.  We understand that in some cases there may be results that are confusing or concerning to you. Not all laboratory results come back in the same time frame and the provider may be waiting for multiple results in order to interpret others.  Please give [place] 48 hours in order for your provider to thoroughly review all the results before contacting the office for clarification of your results.    I appreciate the opportunity to care for you. Thank you for choosing me and [facility],  Dr. Harl Bowie

## 2020-01-18 ENCOUNTER — Inpatient Hospital Stay: Payer: Self-pay | Admitting: Hematology

## 2020-01-18 ENCOUNTER — Inpatient Hospital Stay: Payer: Self-pay
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
## 2020-01-24 ENCOUNTER — Encounter: Payer: Self-pay | Admitting: Hematology

## 2020-01-24 ENCOUNTER — Inpatient Hospital Stay: Payer: Self-pay | Attending: Hematology

## 2020-01-24 ENCOUNTER — Inpatient Hospital Stay (HOSPITAL_BASED_OUTPATIENT_CLINIC_OR_DEPARTMENT_OTHER): Payer: Self-pay | Admitting: Hematology

## 2020-01-24 ENCOUNTER — Other Ambulatory Visit: Payer: Self-pay

## 2020-01-24 ENCOUNTER — Inpatient Hospital Stay: Payer: Self-pay

## 2020-01-24 VITALS — BP 168/94 | HR 69 | Temp 97.5°F | Resp 18 | Ht 63.0 in | Wt 107.1 lb

## 2020-01-24 DIAGNOSIS — R110 Nausea: Secondary | ICD-10-CM | POA: Insufficient documentation

## 2020-01-24 DIAGNOSIS — Z8719 Personal history of other diseases of the digestive system: Secondary | ICD-10-CM | POA: Insufficient documentation

## 2020-01-24 DIAGNOSIS — F17210 Nicotine dependence, cigarettes, uncomplicated: Secondary | ICD-10-CM | POA: Insufficient documentation

## 2020-01-24 DIAGNOSIS — C7A012 Malignant carcinoid tumor of the ileum: Secondary | ICD-10-CM

## 2020-01-24 DIAGNOSIS — Z85060 Personal history of malignant carcinoid tumor of small intestine: Secondary | ICD-10-CM | POA: Insufficient documentation

## 2020-01-24 DIAGNOSIS — K5730 Diverticulosis of large intestine without perforation or abscess without bleeding: Secondary | ICD-10-CM | POA: Insufficient documentation

## 2020-01-24 DIAGNOSIS — I10 Essential (primary) hypertension: Secondary | ICD-10-CM | POA: Insufficient documentation

## 2020-01-24 DIAGNOSIS — M5136 Other intervertebral disc degeneration, lumbar region: Secondary | ICD-10-CM | POA: Insufficient documentation

## 2020-01-24 DIAGNOSIS — E7800 Pure hypercholesterolemia, unspecified: Secondary | ICD-10-CM | POA: Insufficient documentation

## 2020-01-24 DIAGNOSIS — E876 Hypokalemia: Secondary | ICD-10-CM | POA: Insufficient documentation

## 2020-01-24 DIAGNOSIS — Z7984 Long term (current) use of oral hypoglycemic drugs: Secondary | ICD-10-CM | POA: Insufficient documentation

## 2020-01-24 DIAGNOSIS — E559 Vitamin D deficiency, unspecified: Secondary | ICD-10-CM | POA: Insufficient documentation

## 2020-01-24 LAB — CBC WITH DIFFERENTIAL (CANCER CENTER ONLY)
Abs Immature Granulocytes: 0.02 10*3/uL (ref 0.00–0.07)
Basophils Absolute: 0.1 10*3/uL (ref 0.0–0.1)
Basophils Relative: 1 %
Eosinophils Absolute: 0.2 10*3/uL (ref 0.0–0.5)
Eosinophils Relative: 3 %
HCT: 37.7 % (ref 36.0–46.0)
Hemoglobin: 13 g/dL (ref 12.0–15.0)
Immature Granulocytes: 0 %
Lymphocytes Relative: 45 %
Lymphs Abs: 3.1 10*3/uL (ref 0.7–4.0)
MCH: 32.5 pg (ref 26.0–34.0)
MCHC: 34.5 g/dL (ref 30.0–36.0)
MCV: 94.3 fL (ref 80.0–100.0)
Monocytes Absolute: 0.5 10*3/uL (ref 0.1–1.0)
Monocytes Relative: 8 %
Neutro Abs: 3 10*3/uL (ref 1.7–7.7)
Neutrophils Relative %: 43 %
Platelet Count: 356 10*3/uL (ref 150–400)
RBC: 4 MIL/uL (ref 3.87–5.11)
RDW: 13.4 % (ref 11.5–15.5)
WBC Count: 6.9 10*3/uL (ref 4.0–10.5)
nRBC: 0 % (ref 0.0–0.2)

## 2020-01-24 LAB — CMP (CANCER CENTER ONLY)
ALT: 14 U/L (ref 0–44)
AST: 19 U/L (ref 15–41)
Albumin: 3.9 g/dL (ref 3.5–5.0)
Alkaline Phosphatase: 64 U/L (ref 38–126)
Anion gap: 11 (ref 5–15)
BUN: 24 mg/dL — ABNORMAL HIGH (ref 8–23)
CO2: 24 mmol/L (ref 22–32)
Calcium: 9.9 mg/dL (ref 8.9–10.3)
Chloride: 98 mmol/L (ref 98–111)
Creatinine: 1.43 mg/dL — ABNORMAL HIGH (ref 0.44–1.00)
GFR, Est AFR Am: 45 mL/min — ABNORMAL LOW (ref >60)
GFR, Estimated: 39 mL/min — ABNORMAL LOW (ref >60)
Glucose, Bld: 72 mg/dL (ref 70–99)
Potassium: 2.6 mmol/L — CL (ref 3.5–5.1)
Sodium: 133 mmol/L — ABNORMAL LOW (ref 135–145)
Total Bilirubin: 0.5 mg/dL (ref 0.3–1.2)
Total Protein: 7.6 g/dL (ref 6.5–8.1)

## 2020-01-24 MED ORDER — POTASSIUM CHLORIDE 10 MEQ/100ML IV SOLN: 10.0000 meq | INTRAVENOUS | Status: DC

## 2020-01-24 NOTE — Progress Notes (Signed)
LATE ENTRY-Critical Potassium of 2.6 given to [name], LPN  At 677 am who is supporting Meredith Goodman today.  She gave the result to [facility] and the patient is now scheduled for IV potassium on Saturday.  Gardiner Rhyme, RN

## 2020-01-24 NOTE — Progress Notes (Signed)
CRITICAL VALUE  Potassium 2.6 Dr. Burr Medico notified at 11:15

## 2020-01-25 ENCOUNTER — Telehealth: Payer: Self-pay | Admitting: Nurse Practitioner

## 2020-01-25 ENCOUNTER — Telehealth: Payer: Self-pay

## 2020-01-25 ENCOUNTER — Other Ambulatory Visit: Payer: Self-pay | Admitting: Hematology

## 2020-01-25 ENCOUNTER — Encounter: Payer: Self-pay | Admitting: Gastroenterology

## 2020-01-25 ENCOUNTER — Ambulatory Visit (AMBULATORY_SURGERY_CENTER): Payer: Self-pay | Admitting: Gastroenterology

## 2020-01-25 VITALS — BP 139/75 | HR 58 | Temp 96.7°F | Resp 21 | Ht 63.0 in | Wt 107.0 lb

## 2020-01-25 DIAGNOSIS — R1031 Right lower quadrant pain: Secondary | ICD-10-CM

## 2020-01-25 DIAGNOSIS — E876 Hypokalemia: Secondary | ICD-10-CM

## 2020-01-25 DIAGNOSIS — K648 Other hemorrhoids: Secondary | ICD-10-CM

## 2020-01-25 DIAGNOSIS — K5730 Diverticulosis of large intestine without perforation or abscess without bleeding: Secondary | ICD-10-CM

## 2020-01-25 LAB — CHROMOGRANIN A: Chromogranin A (ng/mL): 116.8 ng/mL — ABNORMAL HIGH (ref 0.0–101.8)

## 2020-01-25 MED ORDER — POTASSIUM CHLORIDE 10 MEQ/100ML IV SOLN: 10.0000 meq | INTRAVENOUS | Status: DC

## 2020-01-25 MED ORDER — SODIUM CHLORIDE 0.9 % IV SOLN: 500.0000 mL | Freq: Once | INTRAVENOUS | Status: DC

## 2020-01-25 NOTE — Telephone Encounter (Signed)
Scheduled per 7/14 los. Unable to reach pt. Left voicemail with appt time and date.

## 2020-01-25 NOTE — Progress Notes (Signed)
Dr. Silverio Decamp came to speak to pt after the procedure, but she was sleepy.  Gave pt the option to wait to speak to Dr. Silverio Decamp prior to d/c, but she declined due to having to wait.

## 2020-01-25 NOTE — Patient Instructions (Signed)
Handouts given for diverticulosis and hemorrhoids.  Repeat colonoscopy in 5 years and return to my office in 6 months, the office will call to schedule.  YOU HAD AN ENDOSCOPIC PROCEDURE TODAY AT [facility]:   Refer to the procedure report that was given to you for any specific questions about what was found during the examination.  If the procedure report does not answer your questions, please call your gastroenterologist to clarify.  If you requested that your care partner not be given the details of your procedure findings, then the procedure report has been included in a sealed envelope for you to review at your convenience later.  YOU SHOULD EXPECT: Some feelings of bloating in the abdomen. Passage of more gas than usual.  Walking can help get rid of the air that was put into your GI tract during the procedure and reduce the bloating. If you had a lower endoscopy (such as a colonoscopy or flexible sigmoidoscopy) you may notice spotting of blood in your stool or on the toilet paper. If you underwent a bowel prep for your procedure, you may not have a normal bowel movement for a few days.  Please Note:  You might notice some irritation and congestion in your nose or some drainage.  This is from the oxygen used during your procedure.  There is no need for concern and it should clear up in a day or so.  SYMPTOMS TO REPORT IMMEDIATELY:   Following lower endoscopy (colonoscopy or flexible sigmoidoscopy):  Excessive amounts of blood in the stool  Significant tenderness or worsening of abdominal pains  Swelling of the abdomen that is new, acute  Fever of 100F or higher  For urgent or emergent issues, a gastroenterologist can be reached at any hour by calling [phone]. Do not use MyChart messaging for urgent concerns.    DIET:  We do recommend a small meal at first, but then you may proceed to your regular diet.  Drink plenty of fluids but you should avoid alcoholic  beverages for 24 hours.  ACTIVITY:  You should plan to take it easy for the rest of today and you should NOT DRIVE or use heavy machinery until tomorrow (because of the sedation medicines used during the test).    FOLLOW UP: Our staff will call the number listed on your records 48-72 hours following your procedure to check on you and address any questions or concerns that you may have regarding the information given to you following your procedure. If we do not reach you, we will leave a message.  We will attempt to reach you two times.  During this call, we will ask if you have developed any symptoms of COVID 19. If you develop any symptoms (ie: fever, flu-like symptoms, shortness of breath, cough etc.) before then, please call [phone].  If you test positive for Covid 19 in the 2 weeks post procedure, please call and report this information to [place].    If any biopsies were taken you will be contacted by phone or by letter within the next 1-3 weeks.  Please call us at [phone] if you have not heard about the biopsies in 3 weeks.    SIGNATURES/CONFIDENTIALITY: You and/or your care partner have signed paperwork which will be entered into your electronic medical record.  These signatures attest to the fact that that the information above on your After Visit Summary has been reviewed and is understood.  Full responsibility of the confidentiality of this  discharge information lies with you and/or your care-partner.

## 2020-01-25 NOTE — Progress Notes (Signed)
Report to PACU, RN, vss, BBS= Clear.  

## 2020-01-25 NOTE — Op Note (Signed)
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 The patient's tolerance of                            previous anesthesia was also reviewed. The risks                            and benefits of the procedure and the sedation                            options and risks were discussed with the patient.                            All questions were answered, and informed consent                            was obtained. Prior Anticoagulants: The patient has                            taken no previous anticoagulant or antiplatelet                            agents. ASA Grade Assessment: II - A patient with                            mild systemic disease. After reviewing the risks                            and benefits, the patient was deemed in                            satisfactory condition to undergo the procedure.  After obtaining informed consent, the colonoscope                            was passed under direct vision. Throughout the                            procedure, the patient's blood pressure, pulse, and                            oxygen saturations were monitored continuously. The                            Colonoscope was introduced through the anus and                            advanced to the  the cecum, identified by                            appendiceal orifice and ileocecal valve. The                            colonoscopy was performed without difficulty. The                            patient tolerated the procedure well. The quality                            of the bowel preparation was good. Ileo colonic                            anastomosis and rectum were photographed. Scope In: 9:41:42 AM Scope Out: 9:55:28 AM Scope Withdrawal Time: 0 hours 8 minutes 2 seconds  Total Procedure Duration: 0 hours 13 minutes 46 seconds  Findings:                 The perianal and digital rectal examinations were                            normal.                           There was evidence of a prior end-to-side                            ileo-colonic anastomosis in the ascending colon.                            This was patent and was characterized by healthy                            appearing mucosa. The anastomosis was traversed.                           The neo-terminal ileum appeared normal.  A few small-mouthed diverticula were found in the                            sigmoid colon.                           Non-bleeding internal hemorrhoids were found during                            retroflexion. The hemorrhoids were large.                           The exam was otherwise without abnormality on                            direct and retroflexion views. Complications:            No immediate complications. Estimated Blood Loss:     Estimated blood loss was minimal. Impression:               - Patent end-to-side ileo-colonic anastomosis,                            characterized by healthy appearing mucosa.                           - The examined portion of the ileum was normal.                           - Diverticulosis in the sigmoid colon.                           - Non-bleeding internal hemorrhoids.                           - The examination  was otherwise normal on direct                            and retroflexion views.                           - No specimens collected. Recommendation:           - Patient has a contact number available for                            emergencies. The signs and symptoms of potential                            delayed complications were discussed with the                            patient. Return to normal activities tomorrow.                            Written discharge instructions were provided to the  patient.                           - Resume previous diet.                           - Continue present medications.                           - Repeat colonoscopy in 5 years for screening                            purposes.                           - Return to GI clinic in 6 months. Mauri Pole, MD 01/25/2020 10:02:04 AM This report has been signed electronically.

## 2020-01-25 NOTE — Progress Notes (Signed)
VS-CW 

## 2020-01-25 NOTE — Telephone Encounter (Signed)
I attempted to contact Meredith Goodman to confirm she is aware of her appt on Saturday  7/17 for potassium infusion and to confirm she picked up her rx for potassium.  I was unable to leave a message as her mailbox was full.

## 2020-01-27 ENCOUNTER — Inpatient Hospital Stay: Payer: Self-pay

## 2020-01-27 ENCOUNTER — Telehealth: Payer: Self-pay | Admitting: Emergency Medicine

## 2020-01-27 NOTE — Telephone Encounter (Signed)
Called pt regarding IV potassium appt today, pt states she was unaware of appt today.  Pt requests to reschedule appt for another day next week.  Scheduling message sent to call pt for rescheduling, appt for 7/17 cancelled.  Pt instructed to pick up oral potassium prescription in the meantime at her pharmacy, pt verbalized understanding.

## 2020-01-29 ENCOUNTER — Telehealth: Payer: Self-pay

## 2020-01-29 NOTE — Telephone Encounter (Signed)
  Follow up Call-  Call back number 01/25/2020 08/18/2017  Post procedure Call Back phone  # [phone]  Permission to leave phone message Yes Yes  Some recent data might be hidden     Patient questions:  Do you have a fever, pain , or abdominal swelling? No. Pain Score  0 *  Have you tolerated food without any problems? Yes.    Have you been able to return to your normal activities? Yes.    Do you have any questions about your discharge instructions: Diet   No. Medications  No. Follow up visit  No.  Do you have questions or concerns about your Care? No.  Actions: * If pain score is 4 or above: No action needed, pain <4.  1. Have you developed a fever since your procedure? no  2.   Have you had an respiratory symptoms (SOB or cough) since your procedure? no  3.   Have you tested positive for COVID 19 since your procedure no  4.   Have you had any family members/close contacts diagnosed with the COVID 19 since your procedure?  no   If yes to any of these questions please route to Joylene John, RN and Erenest Rasher, RN

## 2020-01-29 NOTE — Telephone Encounter (Signed)
Call completed spoke with pt made her aware of results and continued treatment

## 2020-01-29 NOTE — Telephone Encounter (Signed)
1st follow up call made.  NAULM 

## 2020-01-29 NOTE — Telephone Encounter (Signed)
-----   Message from Alla Feeling, NP sent at 01/28/2020  2:53 PM EDT ----- Please let her know chromogranin A has decreased. Continue other treatments/recommendations as per Dr. Burr Medico from visit last week.   Thanks, Regan Rakers NP

## 2020-01-30 ENCOUNTER — Ambulatory Visit: Payer: Self-pay

## 2020-01-31 ENCOUNTER — Inpatient Hospital Stay: Payer: Self-pay

## 2020-02-13 ENCOUNTER — Telehealth: Payer: Self-pay

## 2020-02-13 ENCOUNTER — Other Ambulatory Visit: Payer: Self-pay | Admitting: Obstetrics and Gynecology

## 2020-02-13 DIAGNOSIS — Z1231 Encounter for screening mammogram for malignant neoplasm of breast: Secondary | ICD-10-CM

## 2020-02-13 NOTE — Telephone Encounter (Signed)
Telephoned patient at home number. Mailbox full, unable to leave call back information for [facility].

## 2020-02-29 ENCOUNTER — Other Ambulatory Visit: Payer: Self-pay

## 2020-02-29 ENCOUNTER — Ambulatory Visit: Payer: Self-pay | Admitting: Hematology

## 2020-03-05 ENCOUNTER — Other Ambulatory Visit: Payer: Self-pay

## 2020-03-05 ENCOUNTER — Ambulatory Visit
Admission: RE | Admit: 2020-03-05 | Discharge: 2020-03-05 | Disposition: A | Discharge status: Home / Self Care | Payer: No Typology Code available for payment source | Source: Ambulatory Visit | Attending: Obstetrics and Gynecology | Admitting: Obstetrics and Gynecology

## 2020-03-05 ENCOUNTER — Ambulatory Visit: Payer: No Typology Code available for payment source | Admitting: *Deleted

## 2020-03-05 VITALS — BP 132/82 | Temp 97.3°F | Wt 106.7 lb

## 2020-03-05 DIAGNOSIS — Z01419 Encounter for gynecological examination (general) (routine) without abnormal findings: Secondary | ICD-10-CM

## 2020-03-05 DIAGNOSIS — Z1231 Encounter for screening mammogram for malignant neoplasm of breast: Secondary | ICD-10-CM

## 2020-03-05 NOTE — Progress Notes (Signed)
Ms. Ranae Casebier is a 64 y.o. [phone] female who presents to [facility] clinic today with no complaints.    Pap Smear: Pap smear completed today. Last Pap smear was in 2016 at [facility] and normal per patient. Per patient has no history of an abnormal Pap smear. No Pap smear results are in EPIC.   Physical exam: Breasts Breasts symmetrical. No skin abnormalities bilateral breasts. No nipple retraction bilateral breasts. No nipple discharge bilateral breasts. No lymphadenopathy. No lumps palpated bilateral breasts. No complaints of pain or tenderness on exam.       Pelvic/Bimanual Ext Genitalia No lesions, no swelling and no discharge observed on external genitalia.        Vagina Vagina pink and normal texture. No lesions or discharge observed in vagina.        Cervix Cervix is present. Cervix pink and of normal texture. No discharge observed.    Uterus Uterus is present and palpable. Uterus in normal position and normal size.        Adnexae Bilateral ovaries present and palpable. No tenderness on palpation.         Rectovaginal No rectal exam completed today since patient had no rectal complaints. No skin abnormalities observed on exam.     Smoking History: Patient is a current smoker. Discussed smoking cessation with patient. Referred to the [facility] Quitline and gave resources to free smoking cessation classes offered at [facility].   Patient Navigation: Patient education provided. Access to services provided for patient through [facility] program.   Colorectal Cancer Screening: Per patient has had colonoscopy completed on 2021 No complaints today.    Breast and Cervical Cancer Risk Assessment: Patient does not have family history of breast cancer, known genetic mutations, or radiation treatment to the chest before age 41. Patient does not have history of cervical dysplasia, immunocompromised, or DES exposure in-utero.  Risk Assessment    Risk Scores       03/05/2020   Last edited by: Royston Bake, CMA   5-year risk: 1.2 %   Lifetime risk: 5 %         A: [facility] exam with pap smear No complaints.  P: Referred patient to the [facility] for a screening mammogram on the mobile unit. Appointment scheduled Thursday, March 05, 2020 at 1540.  Loletta Parish, RN 03/05/2020 3:26 PM

## 2020-03-05 NOTE — Patient Instructions (Signed)
Explained breast self awareness with [name]. Pap smear completed today. Let her know [facility] will cover Pap smears and HPV typing every 5 years unless has a history of abnormal Pap smears. Referred patient to the [facility] for a screening mammogram on the mobile unit. Appointment scheduled Thursday, March 05, 2020 at 1540. Patient aware of appointment and will be there. Let patient know will follow up with her within the next couple weeks with results of Pap smear by letter or phone. Informed patient that the [facility] will follow-up with her within the next couple of weeks with results of her mammogram by letter or phone. Patient is a current smoker. Discussed smoking cessation with patient. Referred to the [facility] Quitline and gave resources to free smoking cessation classes offered at [facility]. Meredith Goodman verbalized understanding.  Meredith Goodman, Arvil Chaco, RN 3:26 PM

## 2020-03-06 LAB — CYTOLOGY - PAP
Comment: NEGATIVE
Diagnosis: NEGATIVE
High risk HPV: NEGATIVE

## 2020-03-08 ENCOUNTER — Telehealth: Payer: Self-pay

## 2020-03-08 NOTE — Telephone Encounter (Signed)
Normal Pap/HPV results letter mailed to patient.
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
## 2020-05-27 ENCOUNTER — Other Ambulatory Visit: Payer: Self-pay

## 2020-05-27 ENCOUNTER — Ambulatory Visit: Payer: Self-pay | Admitting: Nurse Practitioner

## 2020-05-28 ENCOUNTER — Telehealth: Payer: Self-pay | Admitting: Nurse Practitioner

## 2020-05-28 NOTE — Telephone Encounter (Signed)
Scheduled appt per 11/16 sch msg - pt is aware of appt date and time  ° °

## 2020-06-10 ENCOUNTER — Inpatient Hospital Stay: Payer: No Typology Code available for payment source

## 2020-06-10 ENCOUNTER — Inpatient Hospital Stay: Payer: No Typology Code available for payment source | Admitting: Nurse Practitioner

## 2020-06-12 ENCOUNTER — Telehealth: Payer: Self-pay | Admitting: Nurse Practitioner

## 2020-06-12 NOTE — Telephone Encounter (Signed)
R/s appt per 11/30 sch msg - pt is aware of appt date and time
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
## 2020-07-16 ENCOUNTER — Inpatient Hospital Stay: Payer: Self-pay | Attending: Nurse Practitioner

## 2020-07-16 ENCOUNTER — Inpatient Hospital Stay (HOSPITAL_BASED_OUTPATIENT_CLINIC_OR_DEPARTMENT_OTHER): Payer: Self-pay | Admitting: Nurse Practitioner

## 2020-07-16 ENCOUNTER — Encounter: Payer: Self-pay | Admitting: Nurse Practitioner

## 2020-07-16 ENCOUNTER — Ambulatory Visit (HOSPITAL_COMMUNITY)
Admission: RE | Admit: 2020-07-16 | Discharge: 2020-07-16 | Disposition: A | Discharge status: Home / Self Care | Payer: No Typology Code available for payment source | Source: Ambulatory Visit | Attending: Nurse Practitioner | Admitting: Nurse Practitioner

## 2020-07-16 ENCOUNTER — Other Ambulatory Visit: Payer: Self-pay

## 2020-07-16 VITALS — BP 145/83 | HR 82 | Temp 96.4°F | Resp 16 | Ht 63.0 in | Wt 110.2 lb

## 2020-07-16 DIAGNOSIS — E876 Hypokalemia: Secondary | ICD-10-CM

## 2020-07-16 DIAGNOSIS — C7A012 Malignant carcinoid tumor of the ileum: Secondary | ICD-10-CM | POA: Insufficient documentation

## 2020-07-16 DIAGNOSIS — R059 Cough, unspecified: Secondary | ICD-10-CM

## 2020-07-16 LAB — CMP (CANCER CENTER ONLY)
ALT: 11 U/L (ref 0–44)
AST: 17 U/L (ref 15–41)
Albumin: 3.2 g/dL — ABNORMAL LOW (ref 3.5–5.0)
Alkaline Phosphatase: 58 U/L (ref 38–126)
Anion gap: 10 (ref 5–15)
BUN: 16 mg/dL (ref 8–23)
CO2: 26 mmol/L (ref 22–32)
Calcium: 9.2 mg/dL (ref 8.9–10.3)
Chloride: 87 mmol/L — ABNORMAL LOW (ref 98–111)
Creatinine: 1.13 mg/dL — ABNORMAL HIGH (ref 0.44–1.00)
GFR, Estimated: 55 mL/min — ABNORMAL LOW (ref >60)
Glucose, Bld: 91 mg/dL (ref 70–99)
Potassium: 3 mmol/L — ABNORMAL LOW (ref 3.5–5.1)
Sodium: 123 mmol/L — ABNORMAL LOW (ref 135–145)
Total Bilirubin: 0.6 mg/dL (ref 0.3–1.2)
Total Protein: 7.3 g/dL (ref 6.5–8.1)

## 2020-07-16 LAB — CBC WITH DIFFERENTIAL (CANCER CENTER ONLY)
Abs Immature Granulocytes: 0.05 10*3/uL (ref 0.00–0.07)
Basophils Absolute: 0 10*3/uL (ref 0.0–0.1)
Basophils Relative: 0 %
Eosinophils Absolute: 0.1 10*3/uL (ref 0.0–0.5)
Eosinophils Relative: 1 %
HCT: 33 % — ABNORMAL LOW (ref 36.0–46.0)
Hemoglobin: 11.6 g/dL — ABNORMAL LOW (ref 12.0–15.0)
Immature Granulocytes: 0 %
Lymphocytes Relative: 15 %
Lymphs Abs: 2 10*3/uL (ref 0.7–4.0)
MCH: 31.4 pg (ref 26.0–34.0)
MCHC: 35.2 g/dL (ref 30.0–36.0)
MCV: 89.4 fL (ref 80.0–100.0)
Monocytes Absolute: 0.5 10*3/uL (ref 0.1–1.0)
Monocytes Relative: 4 %
Neutro Abs: 10.9 10*3/uL — ABNORMAL HIGH (ref 1.7–7.7)
Neutrophils Relative %: 80 %
Platelet Count: 330 10*3/uL (ref 150–400)
RBC: 3.69 MIL/uL — ABNORMAL LOW (ref 3.87–5.11)
RDW: 13.1 % (ref 11.5–15.5)
WBC Count: 13.5 10*3/uL — ABNORMAL HIGH (ref 4.0–10.5)
nRBC: 0 % (ref 0.0–0.2)

## 2020-07-16 MED ORDER — POTASSIUM CHLORIDE CRYS ER 20 MEQ PO TBCR: 20.0000 meq | EXTENDED_RELEASE_TABLET | Freq: Two times a day (BID) | ORAL | 0 refills | Status: DC

## 2020-07-17 ENCOUNTER — Other Ambulatory Visit: Payer: Self-pay | Admitting: Nurse Practitioner

## 2020-07-17 ENCOUNTER — Telehealth: Payer: Self-pay | Admitting: Hematology

## 2020-07-17 ENCOUNTER — Telehealth: Payer: Self-pay | Admitting: Nurse Practitioner

## 2020-07-17 DIAGNOSIS — J189 Pneumonia, unspecified organism: Secondary | ICD-10-CM

## 2020-07-17 LAB — CHROMOGRANIN A: Chromogranin A (ng/mL): 126 ng/mL — ABNORMAL HIGH (ref 0.0–101.8)

## 2020-07-17 MED ORDER — AMOXICILLIN-POT CLAVULANATE 875-125 MG PO TABS: 1.0000 | ORAL_TABLET | Freq: Two times a day (BID) | ORAL | 0 refills | Status: DC

## 2020-07-17 MED ORDER — AZITHROMYCIN 500 MG PO TABS: 500.0000 mg | ORAL_TABLET | Freq: Every day | ORAL | 0 refills | Status: DC

## 2020-07-17 NOTE — Telephone Encounter (Signed)
Scheduled follow-up appointment per 1/5 schedule message. Patient is aware.

## 2020-07-17 NOTE — Telephone Encounter (Signed)
Scheduled appointments per 1/4 los. Spoke to patient who is aware of appointments dates and times.  

## 2020-07-18 ENCOUNTER — Telehealth: Payer: Self-pay

## 2020-07-18 NOTE — Telephone Encounter (Signed)
-----   Message from Alla Feeling, NP sent at 07/17/2020  2:03 PM EST ----- Please let her know chest xray shows pneumonia in the right lung. I sent in 2 antibiotics, augmentin and azithromycin, start now and take as directed until complete. If she does not improve, or has worsening shortness of breath, fever, etc. She will need to go to ED. Please see if she has scheduled covid19 testing and guide her to an appt if not. I will do phone f/up in 3 weeks to see how she's doing.   Thanks, Regan Rakers

## 2020-07-18 NOTE — Telephone Encounter (Signed)
Called left a message of CXR results and new medication instructions and follow up I encouraged pt to return call to schedule covid testing  Currently awaiting return call

## 2020-07-22 ENCOUNTER — Telehealth: Payer: Self-pay

## 2020-07-22 NOTE — Telephone Encounter (Signed)
Left message of lab results and f/u appt on 1/25 encouraged pt to call for any questions or concerns

## 2020-07-22 NOTE — Telephone Encounter (Signed)
-----   Message from Alla Feeling, NP sent at 07/22/2020  8:23 AM EST ----- Please let her know chromogranin A is chronically elevated, but mild and stable. Remind her of f/up with me 1/25.  Thanks, Regan Rakers

## 2020-08-06 ENCOUNTER — Inpatient Hospital Stay (HOSPITAL_BASED_OUTPATIENT_CLINIC_OR_DEPARTMENT_OTHER): Payer: Self-pay | Admitting: Nurse Practitioner

## 2020-08-06 DIAGNOSIS — C7A012 Malignant carcinoid tumor of the ileum: Secondary | ICD-10-CM

## 2020-08-06 DIAGNOSIS — J189 Pneumonia, unspecified organism: Secondary | ICD-10-CM

## 2020-08-06 NOTE — Progress Notes (Signed)
No answer on two separate attempts to reach patient for scheduled virtual visit. Will attempt to r/s. Next routine visit 09/16/20.   Cira Rue, NP

## 2020-08-07 ENCOUNTER — Telehealth: Payer: Self-pay | Admitting: Hematology and Oncology

## 2020-08-07 NOTE — Telephone Encounter (Signed)
No 1/25 los. No changes made to pt's schedule.  

## 2020-08-08 ENCOUNTER — Telehealth: Payer: Self-pay | Admitting: Nurse Practitioner

## 2020-08-08 NOTE — Telephone Encounter (Signed)
R/s appt per 1/25 shc msg - unable to reach pt - left message with new appt date and time

## 2020-08-14 ENCOUNTER — Inpatient Hospital Stay: Payer: Self-pay | Attending: Nurse Practitioner | Admitting: Nurse Practitioner

## 2020-08-14 DIAGNOSIS — J189 Pneumonia, unspecified organism: Secondary | ICD-10-CM

## 2020-08-14 DIAGNOSIS — C7A012 Malignant carcinoid tumor of the ileum: Secondary | ICD-10-CM

## 2020-08-15 ENCOUNTER — Telehealth: Payer: Self-pay | Admitting: Nurse Practitioner

## 2020-08-15 NOTE — Progress Notes (Signed)
Meredith Goodman did not answer call for our scheduled virtual visit on 08/14/2020. Will attempt to r/s.   Meredith Rue, NP 08/15/2020

## 2020-08-15 NOTE — Telephone Encounter (Signed)
Scheduled appt per 2/2 los. Pt confirmed appt date, time, and that it will be a phone encounter.

## 2020-08-21 ENCOUNTER — Encounter: Payer: Self-pay | Admitting: Nurse Practitioner

## 2020-08-21 ENCOUNTER — Inpatient Hospital Stay (HOSPITAL_BASED_OUTPATIENT_CLINIC_OR_DEPARTMENT_OTHER): Payer: Self-pay | Admitting: Nurse Practitioner

## 2020-08-21 DIAGNOSIS — C7A012 Malignant carcinoid tumor of the ileum: Secondary | ICD-10-CM

## 2020-08-21 DIAGNOSIS — J189 Pneumonia, unspecified organism: Secondary | ICD-10-CM

## 2020-08-21 MED ORDER — AZITHROMYCIN 500 MG PO TABS: 500.0000 mg | ORAL_TABLET | Freq: Every day | ORAL | 0 refills | Status: DC

## 2020-08-21 MED ORDER — AMOXICILLIN-POT CLAVULANATE 875-125 MG PO TABS: 1.0000 | ORAL_TABLET | Freq: Two times a day (BID) | ORAL | 0 refills | Status: DC
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
## 2020-08-22 ENCOUNTER — Telehealth: Payer: Self-pay

## 2020-08-22 NOTE — Telephone Encounter (Signed)
Spoke with pt made her aware of upcoming CT scan of C/A/P reminded pt to come get contrast

## 2020-09-13 ENCOUNTER — Inpatient Hospital Stay: Payer: Self-pay | Attending: Hematology

## 2020-09-13 ENCOUNTER — Other Ambulatory Visit: Payer: No Typology Code available for payment source

## 2020-09-13 ENCOUNTER — Other Ambulatory Visit: Payer: Self-pay

## 2020-09-13 ENCOUNTER — Ambulatory Visit (HOSPITAL_COMMUNITY): Payer: Self-pay

## 2020-09-13 DIAGNOSIS — E119 Type 2 diabetes mellitus without complications: Secondary | ICD-10-CM | POA: Insufficient documentation

## 2020-09-13 DIAGNOSIS — K5730 Diverticulosis of large intestine without perforation or abscess without bleeding: Secondary | ICD-10-CM | POA: Insufficient documentation

## 2020-09-13 DIAGNOSIS — I712 Thoracic aortic aneurysm, without rupture: Secondary | ICD-10-CM | POA: Insufficient documentation

## 2020-09-13 DIAGNOSIS — C7A012 Malignant carcinoid tumor of the ileum: Secondary | ICD-10-CM | POA: Insufficient documentation

## 2020-09-13 DIAGNOSIS — R918 Other nonspecific abnormal finding of lung field: Secondary | ICD-10-CM | POA: Insufficient documentation

## 2020-09-13 DIAGNOSIS — J432 Centrilobular emphysema: Secondary | ICD-10-CM | POA: Insufficient documentation

## 2020-09-13 DIAGNOSIS — K219 Gastro-esophageal reflux disease without esophagitis: Secondary | ICD-10-CM | POA: Insufficient documentation

## 2020-09-13 DIAGNOSIS — M129 Arthropathy, unspecified: Secondary | ICD-10-CM | POA: Insufficient documentation

## 2020-09-13 DIAGNOSIS — F17210 Nicotine dependence, cigarettes, uncomplicated: Secondary | ICD-10-CM | POA: Insufficient documentation

## 2020-09-13 DIAGNOSIS — Z79899 Other long term (current) drug therapy: Secondary | ICD-10-CM | POA: Insufficient documentation

## 2020-09-13 DIAGNOSIS — Z7984 Long term (current) use of oral hypoglycemic drugs: Secondary | ICD-10-CM | POA: Insufficient documentation

## 2020-09-13 DIAGNOSIS — E7800 Pure hypercholesterolemia, unspecified: Secondary | ICD-10-CM | POA: Insufficient documentation

## 2020-09-13 DIAGNOSIS — I10 Essential (primary) hypertension: Secondary | ICD-10-CM | POA: Insufficient documentation

## 2020-09-13 DIAGNOSIS — E876 Hypokalemia: Secondary | ICD-10-CM | POA: Insufficient documentation

## 2020-09-13 DIAGNOSIS — I700 Atherosclerosis of aorta: Secondary | ICD-10-CM | POA: Insufficient documentation

## 2020-09-13 LAB — CMP (CANCER CENTER ONLY)
ALT: 12 U/L (ref 0–44)
AST: 18 U/L (ref 15–41)
Albumin: 3.9 g/dL (ref 3.5–5.0)
Alkaline Phosphatase: 65 U/L (ref 38–126)
Anion gap: 11 (ref 5–15)
BUN: 14 mg/dL (ref 8–23)
CO2: 28 mmol/L (ref 22–32)
Calcium: 9.8 mg/dL (ref 8.9–10.3)
Chloride: 98 mmol/L (ref 98–111)
Creatinine: 1.22 mg/dL — ABNORMAL HIGH (ref 0.44–1.00)
GFR, Estimated: 50 mL/min — ABNORMAL LOW (ref >60)
Glucose, Bld: 76 mg/dL (ref 70–99)
Potassium: 2.9 mmol/L — ABNORMAL LOW (ref 3.5–5.1)
Sodium: 137 mmol/L (ref 135–145)
Total Bilirubin: 0.3 mg/dL (ref 0.3–1.2)
Total Protein: 7.3 g/dL (ref 6.5–8.1)

## 2020-09-13 LAB — CBC WITH DIFFERENTIAL (CANCER CENTER ONLY)
Abs Immature Granulocytes: 0.01 10*3/uL (ref 0.00–0.07)
Basophils Absolute: 0.1 10*3/uL (ref 0.0–0.1)
Basophils Relative: 2 %
Eosinophils Absolute: 0.5 10*3/uL (ref 0.0–0.5)
Eosinophils Relative: 8 %
HCT: 37.8 % (ref 36.0–46.0)
Hemoglobin: 13 g/dL (ref 12.0–15.0)
Immature Granulocytes: 0 %
Lymphocytes Relative: 43 %
Lymphs Abs: 2.6 10*3/uL (ref 0.7–4.0)
MCH: 32 pg (ref 26.0–34.0)
MCHC: 34.4 g/dL (ref 30.0–36.0)
MCV: 93.1 fL (ref 80.0–100.0)
Monocytes Absolute: 0.5 10*3/uL (ref 0.1–1.0)
Monocytes Relative: 8 %
Neutro Abs: 2.4 10*3/uL (ref 1.7–7.7)
Neutrophils Relative %: 39 %
Platelet Count: 341 10*3/uL (ref 150–400)
RBC: 4.06 MIL/uL (ref 3.87–5.11)
RDW: 14.1 % (ref 11.5–15.5)
WBC Count: 6.1 10*3/uL (ref 4.0–10.5)
nRBC: 0 % (ref 0.0–0.2)
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
## 2020-09-16 ENCOUNTER — Ambulatory Visit (HOSPITAL_COMMUNITY)
Admission: RE | Admit: 2020-09-16 | Discharge: 2020-09-16 | Disposition: A | Discharge status: Home / Self Care | Payer: Self-pay | Source: Ambulatory Visit | Attending: Nurse Practitioner | Admitting: Nurse Practitioner

## 2020-09-16 ENCOUNTER — Telehealth: Payer: Self-pay

## 2020-09-16 ENCOUNTER — Encounter: Payer: Self-pay | Admitting: Hematology

## 2020-09-16 ENCOUNTER — Encounter (HOSPITAL_COMMUNITY): Payer: Self-pay

## 2020-09-16 ENCOUNTER — Other Ambulatory Visit: Payer: Self-pay

## 2020-09-16 ENCOUNTER — Inpatient Hospital Stay (HOSPITAL_BASED_OUTPATIENT_CLINIC_OR_DEPARTMENT_OTHER): Payer: Self-pay | Admitting: Hematology

## 2020-09-16 ENCOUNTER — Ambulatory Visit: Payer: No Typology Code available for payment source | Admitting: Hematology

## 2020-09-16 VITALS — BP 150/85 | HR 92 | Temp 97.5°F | Resp 16 | Ht 63.0 in | Wt 104.0 lb

## 2020-09-16 DIAGNOSIS — C7A012 Malignant carcinoid tumor of the ileum: Secondary | ICD-10-CM | POA: Insufficient documentation

## 2020-09-16 DIAGNOSIS — J189 Pneumonia, unspecified organism: Secondary | ICD-10-CM | POA: Insufficient documentation

## 2020-09-16 MED ORDER — IOHEXOL 300 MG/ML  SOLN
100.0000 mL | Freq: Once | INTRAMUSCULAR | Status: AC | PRN
  Administered 2020-09-16: 100 mL via INTRAVENOUS

## 2020-09-16 NOTE — Telephone Encounter (Signed)
Pt most recent lab returned provider notes potassium to be low pt into office today for MD visit and tx pt taking oral potassium  Awaiting a return call from patient

## 2020-09-16 NOTE — Telephone Encounter (Signed)
-----   Message from Alla Feeling, NP sent at 09/14/2020  9:18 AM EST ----- Please call early Monday to verify she is taking K BID. Increase to 1 tab TID x3 days then BID every day. She has CT then f/up with Dr. Burr Medico on 3/7.  Thanks, Regan Rakers

## 2020-09-16 NOTE — Telephone Encounter (Signed)
I called patient regarding K level and instructions.  She did not answer and I was unable to leave message.

## 2020-12-13 ENCOUNTER — Ambulatory Visit (HOSPITAL_COMMUNITY): Payer: Self-pay

## 2020-12-17 ENCOUNTER — Telehealth: Payer: Self-pay | Admitting: Hematology

## 2020-12-17 NOTE — Telephone Encounter (Signed)
Scheduled appointment per 06/07 sch msg. Left message. 

## 2020-12-19 ENCOUNTER — Ambulatory Visit (HOSPITAL_COMMUNITY)
Admission: RE | Admit: 2020-12-19 | Discharge: 2020-12-19 | Disposition: A | Discharge status: Home / Self Care | Payer: Self-pay | Source: Ambulatory Visit | Attending: Hematology | Admitting: Hematology

## 2020-12-19 ENCOUNTER — Other Ambulatory Visit: Payer: Self-pay

## 2020-12-19 DIAGNOSIS — C7A012 Malignant carcinoid tumor of the ileum: Secondary | ICD-10-CM | POA: Insufficient documentation

## 2020-12-20 ENCOUNTER — Inpatient Hospital Stay: Payer: No Typology Code available for payment source | Attending: Hematology

## 2020-12-20 ENCOUNTER — Inpatient Hospital Stay: Payer: No Typology Code available for payment source | Admitting: Hematology

## 2020-12-23 ENCOUNTER — Telehealth: Payer: Self-pay | Admitting: Hematology

## 2020-12-23 ENCOUNTER — Telehealth: Payer: Self-pay

## 2020-12-23 NOTE — Telephone Encounter (Signed)
I left vm stating she missed her appt with Dr Sherran Needs Friday.  High priority scheduling message sent to reschedule this week.

## 2020-12-23 NOTE — Telephone Encounter (Signed)
Scheduled appointment per 06/13 sch msg. Left detailed message with a[pt time.

## 2020-12-23 NOTE — Telephone Encounter (Signed)
-----   Message from Truitt Merle, MD sent at 12/21/2020  9:04 AM EDT ----- She had no show yesterday, recent CT was abnormal, I need to see her back. Please call her and reschedule, prefer office visit but OK with virtual visit if needed, she may need transportation assistance   Thanks  Krista Blue

## 2020-12-25 ENCOUNTER — Telehealth: Payer: Self-pay

## 2020-12-25 ENCOUNTER — Ambulatory Visit: Payer: No Typology Code available for payment source | Admitting: Hematology

## 2020-12-25 ENCOUNTER — Other Ambulatory Visit: Payer: No Typology Code available for payment source

## 2020-12-25 NOTE — Telephone Encounter (Signed)
This nurse attempted to reach patient related to no show for scheduled appointment today.  There was no answer.  This nurse then called and spoke with patients daughter who stated that she will call her mother and have her give [place] a call.  This nurse advised that Dr. Burr Medico is willing to do a phone visit.  No further questions or concerns at this time.

## 2020-12-26 ENCOUNTER — Telehealth: Payer: Self-pay | Admitting: Hematology

## 2020-12-26 NOTE — Telephone Encounter (Signed)
Scheduled appt per 6/15 sch msg. Pt aware. Pt is aware appt is a phone visit.

## 2020-12-27 ENCOUNTER — Encounter: Payer: Self-pay | Admitting: Hematology

## 2020-12-27 ENCOUNTER — Inpatient Hospital Stay (HOSPITAL_BASED_OUTPATIENT_CLINIC_OR_DEPARTMENT_OTHER): Payer: No Typology Code available for payment source | Admitting: Hematology

## 2020-12-27 DIAGNOSIS — C7A012 Malignant carcinoid tumor of the ileum: Secondary | ICD-10-CM
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
## 2021-01-06 ENCOUNTER — Telehealth: Payer: Self-pay

## 2021-01-06 NOTE — Telephone Encounter (Signed)
I spoke with [name], MS Royce's daughter.  I reviewed her mothers PET scan appt and instructions and her lab and f/u with Dr Burr Medico.  She verbalized understanding

## 2021-01-22 ENCOUNTER — Ambulatory Visit (HOSPITAL_COMMUNITY): Admission: RE | Admit: 2021-01-22 | Payer: Self-pay | Source: Ambulatory Visit

## 2021-01-22 ENCOUNTER — Telehealth: Payer: Self-pay | Admitting: Hematology

## 2021-01-22 NOTE — Telephone Encounter (Signed)
Rescheduled upcoming appointment due to provider's breast clinic. Patient is aware of changes. 

## 2021-01-28 ENCOUNTER — Ambulatory Visit: Payer: No Typology Code available for payment source | Admitting: Hematology

## 2021-01-28 ENCOUNTER — Other Ambulatory Visit: Payer: No Typology Code available for payment source

## 2021-02-03 ENCOUNTER — Telehealth: Payer: Self-pay | Admitting: Hematology

## 2021-02-03 NOTE — Telephone Encounter (Signed)
Led appointment per 07/25 sch msg. Patient is aware.

## 2021-02-05 ENCOUNTER — Inpatient Hospital Stay: Payer: No Typology Code available for payment source | Attending: Hematology

## 2021-02-05 ENCOUNTER — Encounter (HOSPITAL_COMMUNITY): Payer: Self-pay

## 2021-02-05 ENCOUNTER — Other Ambulatory Visit: Payer: Self-pay | Admitting: Nurse Practitioner

## 2021-02-05 ENCOUNTER — Other Ambulatory Visit: Payer: Self-pay

## 2021-02-05 ENCOUNTER — Ambulatory Visit (HOSPITAL_COMMUNITY)
Admission: RE | Admit: 2021-02-05 | Discharge: 2021-02-05 | Disposition: A | Discharge status: Home / Self Care | Payer: Self-pay | Source: Ambulatory Visit | Attending: Nurse Practitioner | Admitting: Nurse Practitioner

## 2021-02-05 ENCOUNTER — Other Ambulatory Visit: Payer: No Typology Code available for payment source

## 2021-02-05 DIAGNOSIS — C7A012 Malignant carcinoid tumor of the ileum: Secondary | ICD-10-CM

## 2021-02-05 LAB — CBC WITH DIFFERENTIAL (CANCER CENTER ONLY)
Abs Immature Granulocytes: 0.01 10*3/uL (ref 0.00–0.07)
Basophils Absolute: 0.1 10*3/uL (ref 0.0–0.1)
Basophils Relative: 1 %
Eosinophils Absolute: 0.2 10*3/uL (ref 0.0–0.5)
Eosinophils Relative: 3 %
HCT: 36.6 % (ref 36.0–46.0)
Hemoglobin: 13 g/dL (ref 12.0–15.0)
Immature Granulocytes: 0 %
Lymphocytes Relative: 38 %
Lymphs Abs: 2.5 10*3/uL (ref 0.7–4.0)
MCH: 32.3 pg (ref 26.0–34.0)
MCHC: 35.5 g/dL (ref 30.0–36.0)
MCV: 90.8 fL (ref 80.0–100.0)
Monocytes Absolute: 0.6 10*3/uL (ref 0.1–1.0)
Monocytes Relative: 8 %
Neutro Abs: 3.3 10*3/uL (ref 1.7–7.7)
Neutrophils Relative %: 50 %
Platelet Count: 340 10*3/uL (ref 150–400)
RBC: 4.03 MIL/uL (ref 3.87–5.11)
RDW: 13.9 % (ref 11.5–15.5)
WBC Count: 6.6 10*3/uL (ref 4.0–10.5)
nRBC: 0 % (ref 0.0–0.2)

## 2021-02-05 LAB — CMP (CANCER CENTER ONLY)
ALT: 12 U/L (ref 0–44)
AST: 22 U/L (ref 15–41)
Albumin: 4 g/dL (ref 3.5–5.0)
Alkaline Phosphatase: 71 U/L (ref 38–126)
Anion gap: 10 (ref 5–15)
BUN: 18 mg/dL (ref 8–23)
CO2: 28 mmol/L (ref 22–32)
Calcium: 10.4 mg/dL — ABNORMAL HIGH (ref 8.9–10.3)
Chloride: 99 mmol/L (ref 98–111)
Creatinine: 1.04 mg/dL — ABNORMAL HIGH (ref 0.44–1.00)
GFR, Estimated: >60 mL/min (ref >60)
Glucose, Bld: 86 mg/dL (ref 70–99)
Potassium: 3.6 mmol/L (ref 3.5–5.1)
Sodium: 137 mmol/L (ref 135–145)
Total Bilirubin: 0.5 mg/dL (ref 0.3–1.2)
Total Protein: 8 g/dL (ref 6.5–8.1)

## 2021-02-06 LAB — CHROMOGRANIN A: Chromogranin A (ng/mL): 164.4 ng/mL — ABNORMAL HIGH (ref 0.0–101.8)

## 2021-02-12 ENCOUNTER — Telehealth: Payer: Self-pay

## 2021-02-12 ENCOUNTER — Other Ambulatory Visit: Payer: No Typology Code available for payment source

## 2021-02-12 ENCOUNTER — Ambulatory Visit: Payer: No Typology Code available for payment source | Admitting: Hematology

## 2021-02-12 NOTE — Telephone Encounter (Signed)
Patient has been made aware of lab results and verbalized understanding.

## 2021-02-12 NOTE — Telephone Encounter (Signed)
-----   Message from Alla Feeling, NP sent at 02/12/2021  3:38 PM EDT ----- Please let her know chromogranin A is gradually increasing. We are arranging PET scan for further evaluation.  Thanks, Regan Rakers, NP

## 2021-02-13 ENCOUNTER — Other Ambulatory Visit: Payer: No Typology Code available for payment source

## 2021-02-13 ENCOUNTER — Inpatient Hospital Stay: Payer: No Typology Code available for payment source | Admitting: Hematology

## 2021-02-13 ENCOUNTER — Telehealth: Payer: Self-pay

## 2021-02-13 NOTE — Telephone Encounter (Signed)
This nurse reached out to patient this morning  related to PET scan.  Patient states that she was not able to attend the scheduled scan on 02/05/21 due to having to work.  This nurse advised patient that the scan is very important to her continued care.  Patient acknowledged understanding.  This nurse informed patient that she can contact central scheduling to set up a day and time that works best for her and she is in agreement.  Contact information provided for scheduling.  This nurse made patient aware that once her PET scan has been scheduled then a follow up appointment will be made to see the provider 1-2 days following.  Patient acknowledged understanding and knows to call clinic with any further questions or concerns.

## 2021-02-28 ENCOUNTER — Ambulatory Visit (HOSPITAL_COMMUNITY)
Admission: RE | Admit: 2021-02-28 | Discharge: 2021-02-28 | Disposition: A | Discharge status: Home / Self Care | Payer: Self-pay | Source: Ambulatory Visit | Attending: Hematology | Admitting: Hematology

## 2021-02-28 ENCOUNTER — Other Ambulatory Visit: Payer: Self-pay

## 2021-02-28 DIAGNOSIS — C7A012 Malignant carcinoid tumor of the ileum: Secondary | ICD-10-CM | POA: Insufficient documentation

## 2021-02-28 MED ORDER — GALLIUM GA 68 DOTATATE IV KIT
2.5800 | PACK | Freq: Once | INTRAVENOUS | Status: AC | PRN
  Administered 2021-02-28: 2.58 via INTRAVENOUS

## 2021-03-05 ENCOUNTER — Telehealth: Payer: Self-pay | Admitting: Hematology

## 2021-03-05 NOTE — Telephone Encounter (Signed)
Scheduled appt per 8/23 sch msg. Pt aware.

## 2021-03-06 ENCOUNTER — Other Ambulatory Visit: Payer: Self-pay

## 2021-03-06 ENCOUNTER — Inpatient Hospital Stay: Payer: Self-pay | Attending: Hematology | Admitting: Hematology

## 2021-03-06 ENCOUNTER — Telehealth: Payer: Self-pay

## 2021-03-06 ENCOUNTER — Other Ambulatory Visit (HOSPITAL_COMMUNITY): Payer: Self-pay

## 2021-03-06 ENCOUNTER — Encounter: Payer: Self-pay | Admitting: Hematology

## 2021-03-06 VITALS — BP 182/94 | HR 68 | Temp 98.6°F | Resp 20 | Ht 63.0 in | Wt 102.5 lb

## 2021-03-06 DIAGNOSIS — J439 Emphysema, unspecified: Secondary | ICD-10-CM | POA: Insufficient documentation

## 2021-03-06 DIAGNOSIS — C7A012 Malignant carcinoid tumor of the ileum: Secondary | ICD-10-CM | POA: Insufficient documentation

## 2021-03-06 DIAGNOSIS — E7800 Pure hypercholesterolemia, unspecified: Secondary | ICD-10-CM | POA: Insufficient documentation

## 2021-03-06 DIAGNOSIS — K5730 Diverticulosis of large intestine without perforation or abscess without bleeding: Secondary | ICD-10-CM | POA: Insufficient documentation

## 2021-03-06 DIAGNOSIS — K219 Gastro-esophageal reflux disease without esophagitis: Secondary | ICD-10-CM | POA: Insufficient documentation

## 2021-03-06 DIAGNOSIS — Z79899 Other long term (current) drug therapy: Secondary | ICD-10-CM | POA: Insufficient documentation

## 2021-03-06 DIAGNOSIS — C7A8 Other malignant neuroendocrine tumors: Secondary | ICD-10-CM

## 2021-03-06 DIAGNOSIS — E119 Type 2 diabetes mellitus without complications: Secondary | ICD-10-CM | POA: Insufficient documentation

## 2021-03-06 DIAGNOSIS — K862 Cyst of pancreas: Secondary | ICD-10-CM | POA: Insufficient documentation

## 2021-03-06 DIAGNOSIS — R634 Abnormal weight loss: Secondary | ICD-10-CM | POA: Insufficient documentation

## 2021-03-06 DIAGNOSIS — I10 Essential (primary) hypertension: Secondary | ICD-10-CM | POA: Insufficient documentation

## 2021-03-06 DIAGNOSIS — D250 Submucous leiomyoma of uterus: Secondary | ICD-10-CM | POA: Insufficient documentation

## 2021-03-06 DIAGNOSIS — E559 Vitamin D deficiency, unspecified: Secondary | ICD-10-CM | POA: Insufficient documentation

## 2021-03-06 DIAGNOSIS — I700 Atherosclerosis of aorta: Secondary | ICD-10-CM | POA: Insufficient documentation

## 2021-03-06 DIAGNOSIS — E876 Hypokalemia: Secondary | ICD-10-CM | POA: Insufficient documentation

## 2021-03-06 DIAGNOSIS — I712 Thoracic aortic aneurysm, without rupture: Secondary | ICD-10-CM | POA: Insufficient documentation

## 2021-03-06 DIAGNOSIS — Z7984 Long term (current) use of oral hypoglycemic drugs: Secondary | ICD-10-CM | POA: Insufficient documentation

## 2021-03-06 DIAGNOSIS — D123 Benign neoplasm of transverse colon: Secondary | ICD-10-CM | POA: Insufficient documentation

## 2021-03-06 MED ORDER — FREESTYLE LITE W/DEVICE KIT
PACK | 0 refills | Status: AC
  Filled 2021-03-06: qty 1, 30d supply, fill #0
  Filled 2021-03-27: qty 1, 90d supply, fill #0
  Filled 2021-04-09: qty 1, 1d supply, fill #0

## 2021-03-06 NOTE — Progress Notes (Signed)
START ON PATHWAY REGIMEN - Neuroendocrine     A cycle is every 28 days:     Lanreotide   **Always confirm dose/schedule in your pharmacy ordering system**  Patient Characteristics: GI Tract, Well-differentiated, Low-grade or Intermediate-grade, Unresectable, Treatment Indicated, First Line Tumor Location: GI Tract Tumor Grade: Low-grade Line of Therapy: First Line Intent of Therapy: Non-Curative / Palliative Intent, Discussed with Patient

## 2021-03-06 NOTE — Telephone Encounter (Signed)
This nurse reached out to patient about missing her 11:20 appointment from today.  Patient stated that she is actually getting dressed now because she thought the appointment was for 1:20.  Patient request to know if MD can do a phone visit.  This nurse advised that She will forward the request to MD.  No further questions or concerns at this time.
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
## 2021-03-06 NOTE — Telephone Encounter (Signed)
This nurse spoke with patients daughter per Dr. Burr Medico to see if she can bring patient to clinic today for afternoon appointment.  Daughter stated that she could have patient her by 340 or 345pm.  No further questions or concerns at this time.

## 2021-03-07 ENCOUNTER — Encounter: Payer: Self-pay | Admitting: General Practice

## 2021-03-07 ENCOUNTER — Telehealth: Payer: Self-pay | Admitting: Primary Care

## 2021-03-07 NOTE — Telephone Encounter (Signed)
   Leshay Desaulniers DOB: May 15, 1956 MRN: [id number]   RIDER WAIVER AND RELEASE OF LIABILITY  For purposes of improving physical access to our facilities, [facility] is pleased to partner with third parties to provide [facility] patients or other authorized individuals the option of convenient, on-demand ground transportation services (the [place]") through use of the technology service that enables users to request on-demand ground transportation from independent third-party providers.  By opting to use and accept these [employer], I, the undersigned, hereby agree on behalf of myself, and on behalf of any minor child using the Government social research officer for whom I am the parent or legal guardian, as follows:  Government social research officer provided to me are provided by independent third-party transportation providers who are not [employer] or employees and who are unaffiliated with [employer]. [place] is neither a transportation carrier nor a common or public carrier. [facility] has no control over the quality or safety of the transportation that occurs as a result of the [employer]. [facility] cannot guarantee that any third-party transportation provider will complete any arranged transportation service. [facility] makes no representation, warranty, or guarantee regarding the reliability, timeliness, quality, safety, suitability, or availability of any of the Transport Services or that they will be error free. I fully understand that traveling by vehicle involves risks and dangers of serious bodily injury, including permanent disability, paralysis, and death. I agree, on behalf of myself and on behalf of any minor child using the Transport Services for whom I am the parent or legal guardian, that the entire risk arising out of my use of the [employer] remains solely with me, to the maximum extent permitted under applicable law. The [employer] are provided "as  is" and "as available." [facility] disclaims all representations and warranties, express, implied or statutory, not expressly set out in these terms, including the implied warranties of merchantability and fitness for a particular purpose. I hereby waive and release [facility], its agents, employees, officers, directors, representatives, insurers, attorneys, assigns, successors, subsidiaries, and affiliates from any and all past, present, or future claims, demands, liabilities, actions, causes of action, or suits of any kind directly or indirectly arising from acceptance and use of the [employer]. I further waive and release [facility] and its affiliates from all present and future liability and responsibility for any injury or death to persons or damages to property caused by or related to the use of the [employer]. I have read this Waiver and Release of Liability, and I understand the terms used in it and their legal significance. This Waiver is freely and voluntarily given with the understanding that my right (as well as the right of any minor child for whom I am the parent or legal guardian using the [employer]) to legal recourse against [place] in connection with the [employer] is knowingly surrendered in return for use of these services.   I attest that I read the consent document to [name], gave Ms. Stemmer the opportunity to ask questions and answered the questions asked (if any). I affirm that Zariya Minner then provided consent for she's participation in this program.     Darrick Meigs [place]

## 2021-03-07 NOTE — Progress Notes (Signed)
[place] [facility] CSW Progress Notes  Called patient at request of medical oncologist to offer referrals for disability assistance and transportation.  Unable to reach patient at numbers in [place], spoke w daughter [name].  Daughter requests these referrals be made on patient behalf.  Per daughter, mother receives small Social Security retirement check and continues to work limited hours.  She does not have health insurance.  She does receive a small amount of Food Stamps.  She applied for disability in the past and was denied, daughter does not know whether she received any help applying for disability or whether an appeal was made after the denial.   Patient has the [employer] but is uninsured.    Provided daughter w contact information for [facility] CSW will place referral today for disability assistance.  Cautioned that patient may not be eligible as she is already drawing funds from [employer].  Provided information on [place] [employer] website so patient can search for affordable health insurance on the Marketplace.  Provided information on patient support/education services available through [place].  Emailed these resources to daughter at her request.  Referrals placed to Transportation and [employer], please reconsult with any further needs.  Edwyna Shell, LCSW Clinical Social Worker Phone:  [phone]

## 2021-03-10 ENCOUNTER — Telehealth: Payer: Self-pay | Admitting: Hematology

## 2021-03-10 NOTE — Telephone Encounter (Signed)
Left message with follow-up appointments per 8/25 los.

## 2021-03-13 ENCOUNTER — Other Ambulatory Visit: Payer: Self-pay | Admitting: *Deleted

## 2021-03-13 NOTE — Progress Notes (Signed)
The proposed treatment discussed in cancer conference is for discussion purpose only and is not a binding recommendation. The patient was not physically examined nor present for their treatment options. Therefore, final treatment plans cannot be decided.  ?

## 2021-03-14 ENCOUNTER — Other Ambulatory Visit (HOSPITAL_COMMUNITY): Payer: Self-pay

## 2021-03-20 NOTE — Progress Notes (Signed)
..  Patient is receiving Assistance Medication - Supplied Externally. Medication: Sandostatin LAR (octreotide acetate) Manufacture: [employer] Patient Assistance [employer] Approval Dates: Approved from 03/20/2021 until 03/20/2022. ID: [id number] Reason: Self Pay First DOS: 03/27/2021. Marland KitchenJuan Quam, CPhT IV Drug Replacement Specialist [place] Phone: [phone]

## 2021-03-25 ENCOUNTER — Telehealth: Payer: Self-pay | Admitting: General Practice

## 2021-03-25 NOTE — Telephone Encounter (Signed)
[place] CSW Progress Notes  Call to daughter [facility] has been unsuccessful in reaching patient to schedule appointment for disability assistance.  CSW provided [facility] w daughter's contact information and also gave daughter their number.  Also confirmed w daughter that patient is enrolled in transportation services and can request rides, provided number to schedule rides as needed.  Edwyna Shell, LCSW Clinical Social Worker Phone:  [phone]

## 2021-03-26 ENCOUNTER — Encounter: Payer: Self-pay | Admitting: Hematology

## 2021-03-26 NOTE — Progress Notes (Signed)
Called pt to introduce myself as her Financial Resource Specialist and to discuss the [employer] grant.  I left a msg requesting she return my call if she's interested in applying for the grant.  ?

## 2021-03-27 ENCOUNTER — Other Ambulatory Visit: Payer: Self-pay

## 2021-03-27 ENCOUNTER — Other Ambulatory Visit (HOSPITAL_COMMUNITY): Payer: Self-pay

## 2021-03-27 ENCOUNTER — Inpatient Hospital Stay: Payer: Medicaid Other | Attending: Hematology

## 2021-03-27 ENCOUNTER — Encounter: Payer: Self-pay | Admitting: Hematology

## 2021-03-27 VITALS — BP 175/92 | HR 83 | Temp 98.3°F | Resp 13

## 2021-03-27 DIAGNOSIS — C7A012 Malignant carcinoid tumor of the ileum: Secondary | ICD-10-CM | POA: Insufficient documentation

## 2021-03-27 DIAGNOSIS — Z79899 Other long term (current) drug therapy: Secondary | ICD-10-CM | POA: Insufficient documentation

## 2021-03-27 MED ORDER — FREESTYLE LANCETS MISC
0 refills | Status: AC
  Filled 2021-03-27 – 2021-04-09 (×2): qty 100, 25d supply, fill #0

## 2021-03-27 MED ORDER — OCTREOTIDE ACETATE 20 MG IM KIT
20.0000 mg | PACK | Freq: Once | INTRAMUSCULAR | Status: AC
  Administered 2021-03-27: 20 mg via INTRAMUSCULAR
  Filled 2021-03-27: qty 1

## 2021-03-27 MED ORDER — FREESTYLE LITE TEST VI STRP
ORAL_STRIP | 0 refills | Status: AC
  Filled 2021-03-27 – 2021-04-09 (×2): qty 100, 25d supply, fill #0

## 2021-03-27 NOTE — Patient Instructions (Signed)
Octreotide injection solution What is this medication? OCTREOTIDE (ok TREE oh tide) is used to reduce blood levels of growth hormone in patients with a condition called acromegaly. This medicine also reduces flushing and watery diarrhea caused by certain types of cancer. This medicine may be used for other purposes; ask your health care provider or pharmacist if you have questions. COMMON BRAND NAME(S): Bynfezia, Sandostatin What should I tell my care team before I take this medication? They need to know if you have any of these conditions: diabetes gallbladder disease kidney disease liver disease thyroid disease an unusual or allergic reaction to octreotide, other medicines, foods, dyes, or preservatives pregnant or trying to get pregnant breast-feeding How should I use this medication? This medicine is for injection under the skin or into a vein (only in emergency situations). It is usually given by a health care professional in a hospital or clinic setting. If you get this medicine at home, you will be taught how to prepare and give this medicine. Allow the injection solution to come to room temperature before use. Do not warm it artificially. Use exactly as directed. Take your medicine at regular intervals. Do not take your medicine more often than directed. It is important that you put your used needles and syringes in a special sharps container. Do not put them in a trash can. If you do not have a sharps container, call your pharmacist or healthcare provider to get one. Talk to your pediatrician regarding the use of this medicine in children. Special care may be needed. Overdosage: If you think you have taken too much of this medicine contact a poison control center or emergency room at once. NOTE: This medicine is only for you. Do not share this medicine with others. What if I miss a dose? If you miss a dose, take it as soon as you can. If it is almost time for your next dose, take only  that dose. Do not take double or extra doses. What may interact with this medication? bromocriptine certain medicines for blood pressure, heart disease, irregular heartbeat cyclosporine diuretics medicines for diabetes, including insulin quinidine This list may not describe all possible interactions. Give your health care provider a list of all the medicines, herbs, non-prescription drugs, or dietary supplements you use. Also tell them if you smoke, drink alcohol, or use illegal drugs. Some items may interact with your medicine. What should I watch for while using this medication? Visit your doctor or health care professional for regular checks on your progress. To help reduce irritation at the injection site, use a different site for each injection and make sure the solution is at room temperature before use. This medicine may cause decreases in blood sugar. Signs of low blood sugar include chills, cool, pale skin or cold sweats, drowsiness, extreme hunger, fast heartbeat, headache, nausea, nervousness or anxiety, shakiness, trembling, unsteadiness, tiredness, or weakness. Contact your doctor or health care professional right away if you experience any of these symptoms. This medicine may increase blood sugar. Ask your healthcare provider if changes in diet or medicines are needed if you have diabetes. This medicine may cause a decrease in vitamin B12. You should make sure that you get enough vitamin B12 while you are taking this medicine. Discuss the foods you eat and the vitamins you take with your health care professional. What side effects may I notice from receiving this medication? Side effects that you should report to your doctor or health care professional as soon as   possible: allergic reactions like skin rash, itching or hives, swelling of the face, lips, or tongue fast, slow, or irregular heartbeat right upper belly pain severe stomach pain signs and symptoms of high blood sugar such  as being more thirsty or hungry or having to urinate more than normal. You may also feel very tired or have blurry vision. signs and symptoms of low blood sugar such as feeling anxious; confusion; dizziness; increased hunger; unusually weak or tired; increased sweating; shakiness; cold, clammy skin; irritable; headache; blurred vision; fast heartbeat; loss of consciousness unusually weak or tired Side effects that usually do not require medical attention (report to your doctor or health care professional if they continue or are bothersome): diarrhea dizziness gas headache nausea, vomiting pain, redness, or irritation at site where injected upset stomach This list may not describe all possible side effects. Call your doctor for medical advice about side effects. You may report side effects to FDA at 1-800-FDA-1088. Where should I keep my medication? Keep out of the reach of children. Store in a refrigerator between 2 and 8 degrees C (36 and 46 degrees F). Protect from light. Allow to come to room temperature naturally. Do not use artificial heat. If protected from light, the injection may be stored at room temperature between 20 and 30 degrees C (70 and 86 degrees F) for 14 days. After the initial use, throw away any unused portion of a multiple dose vial after 14 days. Throw away unused portions of the ampules after use. NOTE: This sheet is a summary. It may not cover all possible information. If you have questions about this medicine, talk to your doctor, pharmacist, or health care provider.  2022 Elsevier/Gold Standard (2019-01-26 13:33:09)  

## 2021-04-04 ENCOUNTER — Other Ambulatory Visit (HOSPITAL_COMMUNITY): Payer: Self-pay

## 2021-04-09 ENCOUNTER — Encounter: Payer: Self-pay | Admitting: Hematology

## 2021-04-09 ENCOUNTER — Other Ambulatory Visit (HOSPITAL_COMMUNITY): Payer: Self-pay

## 2021-04-24 ENCOUNTER — Inpatient Hospital Stay: Payer: Medicaid Other | Attending: Hematology

## 2021-04-24 ENCOUNTER — Other Ambulatory Visit: Payer: Self-pay | Admitting: Hematology

## 2021-04-24 ENCOUNTER — Telehealth: Payer: Self-pay | Admitting: Hematology

## 2021-04-24 ENCOUNTER — Encounter: Payer: Self-pay | Admitting: Hematology

## 2021-04-24 ENCOUNTER — Other Ambulatory Visit: Payer: Self-pay

## 2021-04-24 DIAGNOSIS — C7A012 Malignant carcinoid tumor of the ileum: Secondary | ICD-10-CM | POA: Insufficient documentation

## 2021-04-24 DIAGNOSIS — I10 Essential (primary) hypertension: Secondary | ICD-10-CM

## 2021-04-24 MED ORDER — AMLODIPINE BESYLATE 5 MG PO TABS: 5.0000 mg | ORAL_TABLET | Freq: Every day | ORAL | 0 refills | Status: DC

## 2021-04-24 MED ORDER — CLONIDINE HCL 0.1 MG PO TABS: 0.2000 mg | ORAL_TABLET | Freq: Once | ORAL | Status: AC

## 2021-04-24 MED ORDER — CLONIDINE HCL 0.1 MG PO TABS
ORAL_TABLET | ORAL | Status: AC
  Administered 2021-04-24: 0.2 mg via ORAL
  Filled 2021-04-24: qty 2

## 2021-04-24 NOTE — Telephone Encounter (Signed)
Patient came in today for second Sandostatin injection.  She was found to be hypertensive, blood pressure 210/105 on both arms, she complains of blurry vision for the past few weeks.  No headaches or chest pain or dyspnea.  Sandostatin injection withheld today.  Patient does not want to go to emergency room.  I given her clonidine 0.2 mg once, blood pressure came down to 180/85, her vision improved.  Patient was discharged home with stable condition. I called in amlodipine 5 mg daily today, she will start today.  She will continue atenolol.  She will call her family doctor and get an urgent appointment for her blood pressure issue.  We will reschedule her next injection in 2 weeks.  Meredith Goodman  04/24/2021

## 2021-04-24 NOTE — Progress Notes (Unsigned)
Patient injection was held by MD verbal order due to B/P of 218/104

## 2021-04-25 ENCOUNTER — Telehealth: Payer: Self-pay | Admitting: Hematology

## 2021-04-25 NOTE — Telephone Encounter (Signed)
Rescheduled per 10/13 in basket, pt has been called and is aware of appt change

## 2021-05-05 ENCOUNTER — Other Ambulatory Visit: Payer: Self-pay | Admitting: Hematology

## 2021-05-14 ENCOUNTER — Other Ambulatory Visit: Payer: Self-pay

## 2021-05-14 ENCOUNTER — Telehealth: Payer: Self-pay

## 2021-05-14 DIAGNOSIS — N6310 Unspecified lump in the right breast, unspecified quadrant: Secondary | ICD-10-CM

## 2021-05-14 NOTE — Telephone Encounter (Signed)
Attempted to contact patient concerning scheduling BCCCP appointment. Gave patient first available appointment. Left number for patient to call back.

## 2021-05-15 ENCOUNTER — Inpatient Hospital Stay: Payer: Medicaid Other

## 2021-05-15 ENCOUNTER — Inpatient Hospital Stay: Payer: Medicaid Other | Attending: Hematology

## 2021-05-15 ENCOUNTER — Other Ambulatory Visit: Payer: Self-pay

## 2021-05-15 ENCOUNTER — Inpatient Hospital Stay (HOSPITAL_BASED_OUTPATIENT_CLINIC_OR_DEPARTMENT_OTHER): Payer: Medicaid Other | Admitting: Hematology

## 2021-05-15 VITALS — BP 152/94 | HR 98 | Temp 98.7°F | Resp 18 | Ht 63.0 in | Wt 97.0 lb

## 2021-05-15 DIAGNOSIS — K219 Gastro-esophageal reflux disease without esophagitis: Secondary | ICD-10-CM | POA: Insufficient documentation

## 2021-05-15 DIAGNOSIS — R918 Other nonspecific abnormal finding of lung field: Secondary | ICD-10-CM | POA: Insufficient documentation

## 2021-05-15 DIAGNOSIS — K862 Cyst of pancreas: Secondary | ICD-10-CM | POA: Insufficient documentation

## 2021-05-15 DIAGNOSIS — D250 Submucous leiomyoma of uterus: Secondary | ICD-10-CM | POA: Insufficient documentation

## 2021-05-15 DIAGNOSIS — I700 Atherosclerosis of aorta: Secondary | ICD-10-CM | POA: Insufficient documentation

## 2021-05-15 DIAGNOSIS — K5730 Diverticulosis of large intestine without perforation or abscess without bleeding: Secondary | ICD-10-CM | POA: Insufficient documentation

## 2021-05-15 DIAGNOSIS — C7A012 Malignant carcinoid tumor of the ileum: Secondary | ICD-10-CM

## 2021-05-15 DIAGNOSIS — I10 Essential (primary) hypertension: Secondary | ICD-10-CM | POA: Insufficient documentation

## 2021-05-15 DIAGNOSIS — E876 Hypokalemia: Secondary | ICD-10-CM | POA: Insufficient documentation

## 2021-05-15 DIAGNOSIS — I2510 Atherosclerotic heart disease of native coronary artery without angina pectoris: Secondary | ICD-10-CM | POA: Insufficient documentation

## 2021-05-15 DIAGNOSIS — Z79899 Other long term (current) drug therapy: Secondary | ICD-10-CM | POA: Insufficient documentation

## 2021-05-15 DIAGNOSIS — E7800 Pure hypercholesterolemia, unspecified: Secondary | ICD-10-CM | POA: Insufficient documentation

## 2021-05-15 DIAGNOSIS — E119 Type 2 diabetes mellitus without complications: Secondary | ICD-10-CM | POA: Insufficient documentation

## 2021-05-15 DIAGNOSIS — D123 Benign neoplasm of transverse colon: Secondary | ICD-10-CM | POA: Insufficient documentation

## 2021-05-15 DIAGNOSIS — R634 Abnormal weight loss: Secondary | ICD-10-CM | POA: Insufficient documentation

## 2021-05-15 DIAGNOSIS — J439 Emphysema, unspecified: Secondary | ICD-10-CM | POA: Insufficient documentation

## 2021-05-15 LAB — CBC WITH DIFFERENTIAL/PLATELET
Abs Immature Granulocytes: 0.03 10*3/uL (ref 0.00–0.07)
Basophils Absolute: 0.1 10*3/uL (ref 0.0–0.1)
Basophils Relative: 1 %
Eosinophils Absolute: 0.1 10*3/uL (ref 0.0–0.5)
Eosinophils Relative: 2 %
HCT: 44.1 % (ref 36.0–46.0)
Hemoglobin: 15.1 g/dL — ABNORMAL HIGH (ref 12.0–15.0)
Immature Granulocytes: 0 %
Lymphocytes Relative: 30 %
Lymphs Abs: 2.2 10*3/uL (ref 0.7–4.0)
MCH: 31.5 pg (ref 26.0–34.0)
MCHC: 34.2 g/dL (ref 30.0–36.0)
MCV: 92.1 fL (ref 80.0–100.0)
Monocytes Absolute: 0.5 10*3/uL (ref 0.1–1.0)
Monocytes Relative: 7 %
Neutro Abs: 4.3 10*3/uL (ref 1.7–7.7)
Neutrophils Relative %: 60 %
Platelets: 303 10*3/uL (ref 150–400)
RBC: 4.79 MIL/uL (ref 3.87–5.11)
RDW: 13.6 % (ref 11.5–15.5)
WBC: 7.2 10*3/uL (ref 4.0–10.5)
nRBC: 0 % (ref 0.0–0.2)

## 2021-05-15 LAB — COMPREHENSIVE METABOLIC PANEL
ALT: 29 U/L (ref 0–44)
AST: 34 U/L (ref 15–41)
Albumin: 3.8 g/dL (ref 3.5–5.0)
Alkaline Phosphatase: 125 U/L (ref 38–126)
Anion gap: 11 (ref 5–15)
BUN: 16 mg/dL (ref 8–23)
CO2: 24 mmol/L (ref 22–32)
Calcium: 9.4 mg/dL (ref 8.9–10.3)
Chloride: 105 mmol/L (ref 98–111)
Creatinine, Ser: 0.88 mg/dL (ref 0.44–1.00)
GFR, Estimated: >60 mL/min (ref >60)
Glucose, Bld: 93 mg/dL (ref 70–99)
Potassium: 3.5 mmol/L (ref 3.5–5.1)
Sodium: 140 mmol/L (ref 135–145)
Total Bilirubin: 0.4 mg/dL (ref 0.3–1.2)
Total Protein: 7.9 g/dL (ref 6.5–8.1)

## 2021-05-15 MED ORDER — OCTREOTIDE ACETATE 30 MG IM KIT
30.0000 mg | PACK | Freq: Once | INTRAMUSCULAR | Status: AC
  Administered 2021-05-15: 30 mg via INTRAMUSCULAR

## 2021-05-15 MED ORDER — OCTREOTIDE ACETATE 30 MG IM KIT: 30.0000 mg | PACK | Freq: Once | INTRAMUSCULAR | Status: DC
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
## 2021-05-16 ENCOUNTER — Telehealth: Payer: Self-pay

## 2021-05-16 NOTE — Telephone Encounter (Signed)
Telephoned patient, discussed BCCCP appts  She has questions about disability paperwork.

## 2021-05-17 ENCOUNTER — Encounter: Payer: Self-pay | Admitting: Hematology

## 2021-05-18 ENCOUNTER — Encounter: Payer: Self-pay | Admitting: Hematology

## 2021-05-20 ENCOUNTER — Telehealth: Payer: Self-pay | Admitting: Hematology

## 2021-05-20 NOTE — Telephone Encounter (Signed)
Sch per 11/1 inbasket, pt aware

## 2021-05-22 ENCOUNTER — Other Ambulatory Visit: Payer: No Typology Code available for payment source

## 2021-05-22 ENCOUNTER — Ambulatory Visit: Payer: No Typology Code available for payment source

## 2021-05-22 ENCOUNTER — Ambulatory Visit: Payer: Self-pay

## 2021-05-22 ENCOUNTER — Other Ambulatory Visit: Payer: Self-pay

## 2021-05-22 ENCOUNTER — Ambulatory Visit: Payer: No Typology Code available for payment source | Admitting: Hematology

## 2021-05-29 ENCOUNTER — Telehealth: Payer: Self-pay | Admitting: Nutrition

## 2021-05-29 ENCOUNTER — Ambulatory Visit: Payer: Self-pay | Admitting: Nutrition

## 2021-05-29 NOTE — Progress Notes (Signed)
See telephone note.

## 2021-05-29 NOTE — Telephone Encounter (Signed)
Telephone consult completed with patient.  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 Burr Medico.  She receives octreotide q. 28 days.  Past medical history includes diabetes, GERD, hypercholesterolemia, hypertension, vitamin D deficiency.  Medications include calcium, vitamin D, Glucophage, multivitamin, K-Lor.  Labs include creatinine 1.04.  Height: 5 feet 3 inches. Weight: 97 pounds. Usual body weight: 110 pounds in January 2022. BMI: 17.18.  MD referred patient secondary to ongoing weight loss. Patient is underweight with BMI of 17.18. Patient reports she does have a very poor appetite. She typically consumes an egg sandwich at breakfast with coffee.  She does not usually eat lunch but snacks on potato chips and fruit.  For dinner she typically eats what ever they have at work including something like chicken and macaroni and cheese.  She does like cereal with milk and has been drinking Ensure Plus twice daily.  She reports Ensure costs a lot of money.  Nutrition diagnosis:  Unintended weight loss related to inadequate oral intake as evidenced by 12% weight loss over 10 months.  Intervention: Educated to add calories in small amounts throughout the day.   Reviewed strategies for eating small frequent meals to improve appetite.   Educated on foods that add calories and protein. Recommended increase Ensure Plus or equivalent 3 times daily. Drink milk with meals. Mail fact sheets to patient's home address. Questions were answered and teach back method used.  Contact information will be given. Will provide patient with 1 case of Ensure Enlive when she comes for her appointment on Thursday, December 1. Patient expresses appreciation.  Monitoring, evaluation, goals: Patient will tolerate increased calories and protein to promote weight gain.  Next visit: To be scheduled as needed.  **Disclaimer: This note was dictated with voice recognition  software. Similar sounding words can inadvertently be transcribed and this note may contain transcription errors which may not have been corrected upon publication of note.**

## 2021-06-09 ENCOUNTER — Encounter (HOSPITAL_COMMUNITY): Payer: Self-pay

## 2021-06-09 ENCOUNTER — Other Ambulatory Visit: Payer: Self-pay

## 2021-06-09 ENCOUNTER — Ambulatory Visit (HOSPITAL_COMMUNITY)
Admission: EM | Admit: 2021-06-09 | Discharge: 2021-06-09 | Disposition: A | Discharge status: Home / Self Care | Payer: Medicaid Other | Attending: Student | Admitting: Student

## 2021-06-09 DIAGNOSIS — M5416 Radiculopathy, lumbar region: Secondary | ICD-10-CM

## 2021-06-09 MED ORDER — METHYLPREDNISOLONE SODIUM SUCC 125 MG IJ SOLR
60.0000 mg | Freq: Once | INTRAMUSCULAR | Status: AC
  Administered 2021-06-09: 19:00:00 60 mg via INTRAMUSCULAR

## 2021-06-09 MED ORDER — METHYLPREDNISOLONE SODIUM SUCC 125 MG IJ SOLR
INTRAMUSCULAR | Status: AC
  Filled 2021-06-09: qty 2

## 2021-06-09 MED ORDER — TIZANIDINE HCL 2 MG PO TABS: 2.0000 mg | ORAL_TABLET | Freq: Three times a day (TID) | ORAL | 0 refills | Status: DC | PRN

## 2021-06-09 NOTE — ED Notes (Signed)
Pt states she has not taken the rest of her BP medications.

## 2021-06-09 NOTE — Discharge Instructions (Addendum)
-  Start the muscle relaxer-Zanaflex (tizanidine), up to 3 times daily for muscle spasms and pain.  This can make you drowsy, so take at bedtime or when you do not need to drive or operate machinery. -Tylenol, heating pad

## 2021-06-09 NOTE — ED Triage Notes (Signed)
Pt reports having L leg pain x  1 week.   States the pain starts at her groin area and moves to the leg.
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
## 2021-06-12 ENCOUNTER — Inpatient Hospital Stay: Payer: Medicaid Other | Attending: Hematology

## 2021-06-12 ENCOUNTER — Inpatient Hospital Stay: Payer: Medicaid Other

## 2021-06-12 ENCOUNTER — Other Ambulatory Visit: Payer: Self-pay

## 2021-06-12 ENCOUNTER — Encounter: Payer: Self-pay | Admitting: Hematology

## 2021-06-12 ENCOUNTER — Inpatient Hospital Stay (HOSPITAL_BASED_OUTPATIENT_CLINIC_OR_DEPARTMENT_OTHER): Payer: Medicaid Other | Admitting: Hematology

## 2021-06-12 VITALS — BP 185/99 | HR 69 | Temp 96.7°F | Wt 100.2 lb

## 2021-06-12 DIAGNOSIS — I10 Essential (primary) hypertension: Secondary | ICD-10-CM | POA: Insufficient documentation

## 2021-06-12 DIAGNOSIS — C7A012 Malignant carcinoid tumor of the ileum: Secondary | ICD-10-CM

## 2021-06-12 DIAGNOSIS — C787 Secondary malignant neoplasm of liver and intrahepatic bile duct: Secondary | ICD-10-CM | POA: Insufficient documentation

## 2021-06-12 DIAGNOSIS — F17210 Nicotine dependence, cigarettes, uncomplicated: Secondary | ICD-10-CM | POA: Insufficient documentation

## 2021-06-12 DIAGNOSIS — K219 Gastro-esophageal reflux disease without esophagitis: Secondary | ICD-10-CM | POA: Insufficient documentation

## 2021-06-12 DIAGNOSIS — C7951 Secondary malignant neoplasm of bone: Secondary | ICD-10-CM | POA: Insufficient documentation

## 2021-06-12 DIAGNOSIS — E119 Type 2 diabetes mellitus without complications: Secondary | ICD-10-CM | POA: Insufficient documentation

## 2021-06-12 DIAGNOSIS — E559 Vitamin D deficiency, unspecified: Secondary | ICD-10-CM | POA: Insufficient documentation

## 2021-06-12 DIAGNOSIS — E7800 Pure hypercholesterolemia, unspecified: Secondary | ICD-10-CM | POA: Insufficient documentation

## 2021-06-12 DIAGNOSIS — Z79899 Other long term (current) drug therapy: Secondary | ICD-10-CM | POA: Insufficient documentation

## 2021-06-12 LAB — CMP (CANCER CENTER ONLY)
ALT: 151 U/L — ABNORMAL HIGH (ref 0–44)
AST: 107 U/L — ABNORMAL HIGH (ref 15–41)
Albumin: 3.9 g/dL (ref 3.5–5.0)
Alkaline Phosphatase: 280 U/L — ABNORMAL HIGH (ref 38–126)
Anion gap: 9 (ref 5–15)
BUN: 18 mg/dL (ref 8–23)
CO2: 25 mmol/L (ref 22–32)
Calcium: 9.1 mg/dL (ref 8.9–10.3)
Chloride: 106 mmol/L (ref 98–111)
Creatinine: 1.01 mg/dL — ABNORMAL HIGH (ref 0.44–1.00)
GFR, Estimated: >60 mL/min (ref >60)
Glucose, Bld: 84 mg/dL (ref 70–99)
Potassium: 4 mmol/L (ref 3.5–5.1)
Sodium: 140 mmol/L (ref 135–145)
Total Bilirubin: 0.3 mg/dL (ref 0.3–1.2)
Total Protein: 7.5 g/dL (ref 6.5–8.1)

## 2021-06-12 LAB — CBC WITH DIFFERENTIAL (CANCER CENTER ONLY)
Abs Immature Granulocytes: 0.42 10*3/uL — ABNORMAL HIGH (ref 0.00–0.07)
Basophils Absolute: 0.2 10*3/uL — ABNORMAL HIGH (ref 0.0–0.1)
Basophils Relative: 1 %
Eosinophils Absolute: 0.3 10*3/uL (ref 0.0–0.5)
Eosinophils Relative: 2 %
HCT: 39.1 % (ref 36.0–46.0)
Hemoglobin: 13.3 g/dL (ref 12.0–15.0)
Immature Granulocytes: 4 %
Lymphocytes Relative: 29 %
Lymphs Abs: 3.5 10*3/uL (ref 0.7–4.0)
MCH: 31.1 pg (ref 26.0–34.0)
MCHC: 34 g/dL (ref 30.0–36.0)
MCV: 91.6 fL (ref 80.0–100.0)
Monocytes Absolute: 1.1 10*3/uL — ABNORMAL HIGH (ref 0.1–1.0)
Monocytes Relative: 9 %
Neutro Abs: 6.6 10*3/uL (ref 1.7–7.7)
Neutrophils Relative %: 55 %
Platelet Count: 203 10*3/uL (ref 150–400)
RBC: 4.27 MIL/uL (ref 3.87–5.11)
RDW: 15 % (ref 11.5–15.5)
WBC Count: 12.1 10*3/uL — ABNORMAL HIGH (ref 4.0–10.5)
nRBC: 0.2 % (ref 0.0–0.2)

## 2021-06-12 MED ORDER — OCTREOTIDE ACETATE 30 MG IM KIT
30.0000 mg | PACK | Freq: Once | INTRAMUSCULAR | Status: AC
  Administered 2021-06-12: 30 mg via INTRAMUSCULAR
  Filled 2021-06-12: qty 1

## 2021-06-24 ENCOUNTER — Telehealth: Payer: Self-pay

## 2021-06-24 NOTE — Telephone Encounter (Signed)
LVM with pt stating LFTs were abnormal on her last blood draw which brought some concern for Dr. Burr Medico.  Informed pt that Dr. Burr Medico has added on a lab appointment on 12/29 @1015  prior to the pt's injection appt.  The result of that blood draw will determine whether Dr. Burr Medico will have the pt to do a CT Scan of the Abdomen earlier than initially discussed with pt.  Dr. Burr Medico also would like to know if the pt is having RUQ Abdomen pain.  Instructed pt to return call to confirm receipt of message and to verify whether the pt is have abdominal pain.  Awaiting return telephone call from pt.

## 2021-07-01 ENCOUNTER — Emergency Department (HOSPITAL_COMMUNITY)
Admission: EM | Admit: 2021-07-01 | Discharge: 2021-07-01 | Disposition: A | Discharge status: Home / Self Care | Payer: Medicaid Other | Attending: Emergency Medicine | Admitting: Emergency Medicine

## 2021-07-01 ENCOUNTER — Other Ambulatory Visit: Payer: Self-pay | Admitting: Hematology

## 2021-07-01 ENCOUNTER — Emergency Department (HOSPITAL_COMMUNITY): Payer: Medicaid Other

## 2021-07-01 ENCOUNTER — Other Ambulatory Visit: Payer: Self-pay

## 2021-07-01 ENCOUNTER — Encounter (HOSPITAL_COMMUNITY): Payer: Self-pay | Admitting: Emergency Medicine

## 2021-07-01 DIAGNOSIS — C801 Malignant (primary) neoplasm, unspecified: Secondary | ICD-10-CM | POA: Insufficient documentation

## 2021-07-01 DIAGNOSIS — Z7984 Long term (current) use of oral hypoglycemic drugs: Secondary | ICD-10-CM | POA: Insufficient documentation

## 2021-07-01 DIAGNOSIS — C7A012 Malignant carcinoid tumor of the ileum: Secondary | ICD-10-CM

## 2021-07-01 DIAGNOSIS — I10 Essential (primary) hypertension: Secondary | ICD-10-CM | POA: Insufficient documentation

## 2021-07-01 DIAGNOSIS — Z79899 Other long term (current) drug therapy: Secondary | ICD-10-CM | POA: Insufficient documentation

## 2021-07-01 DIAGNOSIS — E119 Type 2 diabetes mellitus without complications: Secondary | ICD-10-CM | POA: Insufficient documentation

## 2021-07-01 DIAGNOSIS — C787 Secondary malignant neoplasm of liver and intrahepatic bile duct: Secondary | ICD-10-CM

## 2021-07-01 DIAGNOSIS — Z85038 Personal history of other malignant neoplasm of large intestine: Secondary | ICD-10-CM | POA: Insufficient documentation

## 2021-07-01 DIAGNOSIS — F17210 Nicotine dependence, cigarettes, uncomplicated: Secondary | ICD-10-CM | POA: Insufficient documentation

## 2021-07-01 DIAGNOSIS — Z20822 Contact with and (suspected) exposure to covid-19: Secondary | ICD-10-CM | POA: Insufficient documentation

## 2021-07-01 DIAGNOSIS — K219 Gastro-esophageal reflux disease without esophagitis: Secondary | ICD-10-CM | POA: Insufficient documentation

## 2021-07-01 LAB — URINALYSIS, ROUTINE W REFLEX MICROSCOPIC
Bacteria, UA: NONE SEEN
Bilirubin Urine: NEGATIVE
Glucose, UA: NEGATIVE mg/dL
Ketones, ur: NEGATIVE mg/dL
Leukocytes,Ua: NEGATIVE
Nitrite: NEGATIVE
Protein, ur: NEGATIVE mg/dL
Specific Gravity, Urine: 1.014 (ref 1.005–1.030)
pH: 7 (ref 5.0–8.0)

## 2021-07-01 LAB — CBC WITH DIFFERENTIAL/PLATELET
Abs Immature Granulocytes: 0.79 10*3/uL — ABNORMAL HIGH (ref 0.00–0.07)
Basophils Absolute: 0.1 10*3/uL (ref 0.0–0.1)
Basophils Relative: 1 %
Eosinophils Absolute: 0.2 10*3/uL (ref 0.0–0.5)
Eosinophils Relative: 1 %
HCT: 36 % (ref 36.0–46.0)
Hemoglobin: 12.3 g/dL (ref 12.0–15.0)
Immature Granulocytes: 6 %
Lymphocytes Relative: 17 %
Lymphs Abs: 2.2 10*3/uL (ref 0.7–4.0)
MCH: 31.5 pg (ref 26.0–34.0)
MCHC: 34.2 g/dL (ref 30.0–36.0)
MCV: 92.3 fL (ref 80.0–100.0)
Monocytes Absolute: 1.2 10*3/uL — ABNORMAL HIGH (ref 0.1–1.0)
Monocytes Relative: 9 %
Neutro Abs: 8.4 10*3/uL — ABNORMAL HIGH (ref 1.7–7.7)
Neutrophils Relative %: 66 %
Platelets: 109 10*3/uL — ABNORMAL LOW (ref 150–400)
RBC: 3.9 MIL/uL (ref 3.87–5.11)
RDW: 17.1 % — ABNORMAL HIGH (ref 11.5–15.5)
WBC: 12.8 10*3/uL — ABNORMAL HIGH (ref 4.0–10.5)
nRBC: 1.6 % — ABNORMAL HIGH (ref 0.0–0.2)

## 2021-07-01 LAB — COMPREHENSIVE METABOLIC PANEL
ALT: 177 U/L — ABNORMAL HIGH (ref 0–44)
AST: 174 U/L — ABNORMAL HIGH (ref 15–41)
Albumin: 3.7 g/dL (ref 3.5–5.0)
Alkaline Phosphatase: 420 U/L — ABNORMAL HIGH (ref 38–126)
Anion gap: 7 (ref 5–15)
BUN: 15 mg/dL (ref 8–23)
CO2: 26 mmol/L (ref 22–32)
Calcium: 8.8 mg/dL — ABNORMAL LOW (ref 8.9–10.3)
Chloride: 98 mmol/L (ref 98–111)
Creatinine, Ser: 0.88 mg/dL (ref 0.44–1.00)
GFR, Estimated: >60 mL/min (ref >60)
Glucose, Bld: 166 mg/dL — ABNORMAL HIGH (ref 70–99)
Potassium: 3.8 mmol/L (ref 3.5–5.1)
Sodium: 131 mmol/L — ABNORMAL LOW (ref 135–145)
Total Bilirubin: 3 mg/dL — ABNORMAL HIGH (ref 0.3–1.2)
Total Protein: 7.2 g/dL (ref 6.5–8.1)

## 2021-07-01 LAB — RESP PANEL BY RT-PCR (FLU A&B, COVID) ARPGX2
Influenza A by PCR: NEGATIVE
Influenza B by PCR: NEGATIVE
SARS Coronavirus 2 by RT PCR: NEGATIVE

## 2021-07-01 LAB — LIPASE, BLOOD: Lipase: 104 U/L — ABNORMAL HIGH (ref 11–51)

## 2021-07-01 MED ORDER — SODIUM CHLORIDE (PF) 0.9 % IJ SOLN
INTRAMUSCULAR | Status: AC
  Filled 2021-07-01: qty 50

## 2021-07-01 MED ORDER — ONDANSETRON HCL 4 MG/2ML IJ SOLN
4.0000 mg | Freq: Once | INTRAMUSCULAR | Status: AC
  Administered 2021-07-01: 16:00:00 4 mg via INTRAVENOUS
  Filled 2021-07-01: qty 2

## 2021-07-01 MED ORDER — LACTATED RINGERS IV BOLUS
1000.0000 mL | Freq: Once | INTRAVENOUS | Status: AC
  Administered 2021-07-01: 17:00:00 1000 mL via INTRAVENOUS

## 2021-07-01 MED ORDER — HYDROCODONE-ACETAMINOPHEN 5-325 MG PO TABS: 1.0000 | ORAL_TABLET | Freq: Four times a day (QID) | ORAL | 0 refills | Status: DC | PRN

## 2021-07-01 MED ORDER — ONDANSETRON HCL 4 MG PO TABS: 4.0000 mg | ORAL_TABLET | Freq: Three times a day (TID) | ORAL | 0 refills | Status: AC | PRN

## 2021-07-01 MED ORDER — OXYCODONE HCL 5 MG PO TABS: 5.0000 mg | ORAL_TABLET | ORAL | 0 refills | Status: DC | PRN

## 2021-07-01 MED ORDER — FENTANYL CITRATE PF 50 MCG/ML IJ SOSY
50.0000 ug | PREFILLED_SYRINGE | Freq: Once | INTRAMUSCULAR | Status: AC
  Administered 2021-07-01: 16:00:00 50 ug via INTRAVENOUS
  Filled 2021-07-01: qty 1

## 2021-07-01 MED ORDER — FENTANYL CITRATE PF 50 MCG/ML IJ SOSY
50.0000 ug | PREFILLED_SYRINGE | Freq: Once | INTRAMUSCULAR | Status: AC
  Administered 2021-07-01: 18:00:00 50 ug via INTRAVENOUS
  Filled 2021-07-01: qty 1

## 2021-07-01 MED ORDER — IOHEXOL 350 MG/ML SOLN
80.0000 mL | Freq: Once | INTRAVENOUS | Status: AC | PRN
  Administered 2021-07-01: 15:00:00 60 mL via INTRAVENOUS

## 2021-07-01 NOTE — Progress Notes (Signed)
Meredith Goodman   DOB:1955/08/26   GE#:[id number]   YYT#:[id number]  Oncology follow up   Subjective: 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  She is on Sandostatin injection.  She presented with worsening abdominal pain for a week to ED, and a CT scan showed numerous liver lesions.  I saw her in the ED.    Objective:  Vitals:   07/01/21 1900 07/01/21 1957  BP: (!) 168/92 (!) 168/92  Pulse: 83 80  Resp: 18 17  Temp:  98.2 F (36.8 C)  SpO2: 91% 93%    Body mass index is 18.84 kg/m. No intake or output data in the 24 hours ending 07/01/21 2232   Sclerae unicteric  Oropharynx clear  No peripheral adenopathy  Lungs clear -- no rales or rhonchi  Heart regular rate and rhythm  Abdomen soft, distended, significant hepatomegaly with some tenderness   MSK no focal spinal tenderness, no peripheral edema  Neuro nonfocal    CBG (last 3)  No results for input(s): GLUCAP in the last 72 hours.   Labs:  Urine Studies No results for input(s): UHGB, CRYS in the last 72 hours.  Invalid input(s): UACOL, UAPR, USPG, UPH, UTP, UGL, UKET, UBIL, UNIT, UROB, ULEU, UEPI, UWBC, URBC, UBAC, CAST, UCOM, BILUA  Basic Metabolic Panel: Recent Labs  Lab 07/01/21 1313  NA 131*  K 3.8  CL 98  CO2 26  GLUCOSE 166*  BUN 15  CREATININE 0.88  CALCIUM 8.8*   GFR Estimated Creatinine Clearance: 47.6 mL/min (by C-G formula based on SCr of 0.88 mg/dL). Liver Function Tests: Recent Labs  Lab 07/01/21 1313  AST 174*  ALT 177*  ALKPHOS 420*  BILITOT 3.0*  PROT 7.2  ALBUMIN 3.7   Recent Labs  Lab 07/01/21 1313  LIPASE 104*   No results for input(s): AMMONIA in the last 168 hours. Coagulation profile No results for input(s): INR, PROTIME in the last 168 hours.  CBC: Recent Labs  Lab 07/01/21 1313  WBC 12.8*  NEUTROABS 8.4*  HGB 12.3  HCT 36.0  MCV 92.3  PLT 109*   Cardiac Enzymes: No results for input(s): CKTOTAL, CKMB,  CKMBINDEX, TROPONINI in the last 168 hours. BNP: Invalid input(s): POCBNP CBG: No results for input(s): GLUCAP in the last 168 hours. D-Dimer No results for input(s): DDIMER in the last 72 hours. Hgb A1c No results for input(s): HGBA1C in the last 72 hours. Lipid Profile No results for input(s): CHOL, HDL, LDLCALC, TRIG, CHOLHDL, LDLDIRECT in the last 72 hours. Thyroid function studies No results for input(s): TSH, T4TOTAL, T3FREE, THYROIDAB in the last 72 hours.  Invalid input(s): FREET3 Anemia work up No results for input(s): VITAMINB12, FOLATE, FERRITIN, TIBC, IRON, RETICCTPCT in the last 72 hours. Microbiology Recent Results (from the past 240 hour(s))  Resp Panel by RT-PCR (Flu A&B, Covid) Nasopharyngeal Swab     Status: None   Collection Time: 07/01/21  1:13 PM   Specimen: Nasopharyngeal Swab; Nasopharyngeal(NP) swabs in vial transport medium  Result Value Ref Range Status   SARS Coronavirus 2 by RT PCR NEGATIVE NEGATIVE Final    Comment: (NOTE) SARS-CoV-2 target nucleic acids are NOT DETECTED.  The SARS-CoV-2 RNA is generally detectable in upper respiratory specimens during the acute phase of infection. The lowest concentration of SARS-CoV-2 viral copies this assay can detect is 138 copies/mL. A negative result does not preclude SARS-Cov-2 infection and should not be used as the sole basis for treatment or  other patient management decisions. A negative result may occur with  improper specimen collection/handling, submission of specimen other than nasopharyngeal swab, presence of viral mutation(s) within the areas targeted by this assay, and inadequate number of viral copies(<138 copies/mL). A negative result must be combined with clinical observations, patient history, and epidemiological information. The expected result is Negative.  Fact Sheet for Patients:  EntrepreneurPulse.com.au  Fact Sheet for Healthcare Providers:   IncredibleEmployment.be  This test is no t yet approved or cleared by the [place] FDA and  has been authorized for detection and/or diagnosis of SARS-CoV-2 by FDA under an Emergency Use Authorization (EUA). This EUA will remain  in effect (meaning this test can be used) for the duration of the COVID-19 declaration under Section 564(b)(1) of the Act, 21 U.S.C.section 360bbb-3(b)(1), unless the authorization is terminated  or revoked sooner.       Influenza A by PCR NEGATIVE NEGATIVE Final   Influenza B by PCR NEGATIVE NEGATIVE Final    Comment: (NOTE) The Xpert Xpress SARS-CoV-2/FLU/RSV plus assay is intended as an aid in the diagnosis of influenza from Nasopharyngeal swab specimens and should not be used as a sole basis for treatment. Nasal washings and aspirates are unacceptable for Xpert Xpress SARS-CoV-2/FLU/RSV testing.  Fact Sheet for Patients: EntrepreneurPulse.com.au  Fact Sheet for Healthcare Providers: IncredibleEmployment.be  This test is not yet approved or cleared by the [place] FDA and has been authorized for detection and/or diagnosis of SARS-CoV-2 by FDA under an Emergency Use Authorization (EUA). This EUA will remain in effect (meaning this test can be used) for the duration of the COVID-19 declaration under Section 564(b)(1) of the Act, 21 U.S.C. section 360bbb-3(b)(1), unless the authorization is terminated or revoked.  Performed at [facility], [place] [address], [place], [place] [address]       Studies:  CT Abdomen Pelvis W Contrast  Result Date: 07/01/2021 CLINICAL DATA:  Abdominal pain EXAM: CT ABDOMEN AND PELVIS WITH CONTRAST TECHNIQUE: Multidetector CT imaging of the abdomen and pelvis was performed using the standard protocol following bolus administration of intravenous contrast. CONTRAST:  17mL OMNIPAQUE IOHEXOL 350 MG/ML SOLN COMPARISON:  02/28/2021 PET-CT  FINDINGS: Lower chest: Lung bases are free of acute infiltrate or sizable effusion. There is a 3-4 mm nodule identified in the medial aspect of the right lung base best seen on image number 24 of series 4. This was not present on the recent PET-CT from 02/28/2021. Hepatobiliary: Liver is well visualized with a few small cysts identified within the left lobe of the liver and inferiorly in the right lobe of the liver stable in appearance from the prior CT. There are however new peripherally enhancing lesions scattered throughout the liver dominant lesion in the left lobe measures 2.6 cm best seen on image number 18 of series 2. Dominant lesion on the right measures approximately 2 cm best seen on image number 32 of series 2. 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 Pancreas: Unremarkable. No pancreatic ductal dilatation or surrounding inflammatory changes. Spleen: Normal in size without focal abnormality. Adrenals/Urinary Tract: Adrenal glands are unremarkable. Kidneys demonstrate a normal enhancement pattern bilaterally. No renal calculi or obstructive changes are seen. Normal excretion of contrast is noted bilaterally. The bladder is well distended. Stomach/Bowel: Colon shows no obstructive or inflammatory changes. There are findings of prior resection of the right colon and proximal transverse colon seen.  No small bowel obstructive changes are noted. The stomach is within normal limits. Vascular/Lymphatic: Diffuse vascular calcifications are seen. Lymphadenopathy is noted in the porta hepatis which was not well appreciated on recent PET-CT measuring up to 17 mm in short axis. No other definitive lymphadenopathy is seen. Reproductive: Uterus and bilateral adnexa are unremarkable. Other: No abdominal wall hernia or abnormality. No abdominopelvic ascites. Musculoskeletal: No acute or  significant osseous findings. IMPRESSION: Multiple too numerous to count peripherally enhancing lesions within the liver consistent with metastatic disease. Associated porta hepatis lymph nodes are noted as well. 4 mm nodule in the right lung base not seen on the prior PET-CT examination and given the findings in the liver is somewhat suspicious for metastatic disease. Changes of prior right colectomy. Electronically Signed   By: Inez Catalina M.D.   On: 07/01/2021 15:42    Assessment: 65 y.o. female with   Severe abdominal pain, likely secondary to liver metastasis Metastatic neuroendocrine tumor too long and surgical note, on Sandostatin injection Abnormal liver functions, secondary to liver metastasis Weight loss and malnutrition    Plan:  -I have reviewed her labs and CT scan which were obtained in the ED.  Unfortunately the CT scan showed numerous new liver lesions, concerning for metastatic disease.  This is new from her last scan, I am quite surprised that to see the rapid progression, which is uncommon in neuroendocrine tumor. -will repeat dotatate PET scan to see if her liver lesions are metastatic neuroendocrine tumor.  I also discussed the option of liver biopsy. -He will be given IV fluids and pain management, likely go home with oral pain medication.  I will see him back next week after PET scan.  Truitt Merle  07/01/2021    Truitt Merle, MD 07/01/2021  10:32 PM

## 2021-07-01 NOTE — ED Triage Notes (Signed)
Patient c/o abdominal swelling and back pain x 2 weeks. Last BM yesterday. Feels like her food is getting stuck in her upper chest. Did not taken BP medicine today. Denies any urinary symptoms.

## 2021-07-01 NOTE — ED Provider Notes (Signed)
Emergency Medicine Provider Triage Evaluation Note  Meredith Goodman , a 65 y.o. female  was evaluated in triage.  Pt complains of 65 year old female who presents with 2 weeks of progressively worsening abdominal distention, 7 pound unexplained weight loss, nausea without vomiting, she is making bowel movements.  She has a history of ileal cancer.  She denies fevers, chills, chest pain.  Review of Systems  Positive: Abd distension Negative: fever  Physical Exam  BP (!) 170/114 (BP Location: Left Arm)    Pulse 91    Temp 97.9 F (36.6 C) (Oral)    Resp 18    Ht 5\' 2"  (1.575 m)    Wt 46.7 kg    SpO2 98%    BMI 18.84 kg/m  Gen:   Awake, no distress   Resp:  Normal effort  MSK:   Moves extremities without difficulty  Other:  Tight abdominal distention and tenderness  Medical Decision Making  Medically screening exam initiated at 12:30 PM.  Appropriate orders placed.  Meredith Goodman was informed that the remainder of the evaluation will be completed by another provider, this initial triage assessment does not replace that evaluation, and the importance of remaining in the ED until their evaluation is complete.  Patient denies a history of alcohol abuse.  She appears to have tight ascites.  I have ordered work-up for the patient.   Margarita Mail, PA-C 07/01/21 1234    Lacretia Leigh, MD 07/02/21 [phone]
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
## 2021-07-03 ENCOUNTER — Inpatient Hospital Stay (HOSPITAL_BASED_OUTPATIENT_CLINIC_OR_DEPARTMENT_OTHER): Payer: Medicaid Other | Admitting: Hematology

## 2021-07-03 ENCOUNTER — Inpatient Hospital Stay: Payer: Medicaid Other

## 2021-07-03 ENCOUNTER — Other Ambulatory Visit: Payer: Self-pay

## 2021-07-03 ENCOUNTER — Encounter: Payer: Self-pay | Admitting: Nutrition

## 2021-07-03 ENCOUNTER — Encounter: Payer: Self-pay | Admitting: Hematology

## 2021-07-03 ENCOUNTER — Encounter (HOSPITAL_COMMUNITY): Payer: Self-pay | Admitting: Radiology

## 2021-07-03 VITALS — BP 194/98 | HR 83 | Temp 97.9°F | Resp 18 | Ht 62.0 in | Wt 103.1 lb

## 2021-07-03 DIAGNOSIS — C7A012 Malignant carcinoid tumor of the ileum: Secondary | ICD-10-CM

## 2021-07-03 LAB — CBC WITH DIFFERENTIAL (CANCER CENTER ONLY)
Abs Immature Granulocytes: 0.8 10*3/uL — ABNORMAL HIGH (ref 0.00–0.07)
Basophils Absolute: 0.1 10*3/uL (ref 0.0–0.1)
Basophils Relative: 1 %
Eosinophils Absolute: 0.1 10*3/uL (ref 0.0–0.5)
Eosinophils Relative: 1 %
HCT: 34 % — ABNORMAL LOW (ref 36.0–46.0)
Hemoglobin: 11.8 g/dL — ABNORMAL LOW (ref 12.0–15.0)
Immature Granulocytes: 5 %
Lymphocytes Relative: 13 %
Lymphs Abs: 2 10*3/uL (ref 0.7–4.0)
MCH: 31.4 pg (ref 26.0–34.0)
MCHC: 34.7 g/dL (ref 30.0–36.0)
MCV: 90.4 fL (ref 80.0–100.0)
Monocytes Absolute: 1.5 10*3/uL — ABNORMAL HIGH (ref 0.1–1.0)
Monocytes Relative: 9 %
Neutro Abs: 11.4 10*3/uL — ABNORMAL HIGH (ref 1.7–7.7)
Neutrophils Relative %: 71 %
Platelet Count: 108 10*3/uL — ABNORMAL LOW (ref 150–400)
RBC: 3.76 MIL/uL — ABNORMAL LOW (ref 3.87–5.11)
RDW: 17 % — ABNORMAL HIGH (ref 11.5–15.5)
WBC Count: 15.9 10*3/uL — ABNORMAL HIGH (ref 4.0–10.5)
nRBC: 1.5 % — ABNORMAL HIGH (ref 0.0–0.2)

## 2021-07-03 LAB — CMP (CANCER CENTER ONLY)
ALT: 155 U/L — ABNORMAL HIGH (ref 0–44)
AST: 119 U/L — ABNORMAL HIGH (ref 15–41)
Albumin: 3.8 g/dL (ref 3.5–5.0)
Alkaline Phosphatase: 451 U/L — ABNORMAL HIGH (ref 38–126)
Anion gap: 8 (ref 5–15)
BUN: 17 mg/dL (ref 8–23)
CO2: 25 mmol/L (ref 22–32)
Calcium: 9.1 mg/dL (ref 8.9–10.3)
Chloride: 99 mmol/L (ref 98–111)
Creatinine: 0.75 mg/dL (ref 0.44–1.00)
GFR, Estimated: >60 mL/min (ref >60)
Glucose, Bld: 105 mg/dL — ABNORMAL HIGH (ref 70–99)
Potassium: 3.7 mmol/L (ref 3.5–5.1)
Sodium: 132 mmol/L — ABNORMAL LOW (ref 135–145)
Total Bilirubin: 3.6 mg/dL (ref 0.3–1.2)
Total Protein: 6.6 g/dL (ref 6.5–8.1)

## 2021-07-03 MED ORDER — MORPHINE SULFATE ER 15 MG PO TBCR: 15.0000 mg | EXTENDED_RELEASE_TABLET | Freq: Two times a day (BID) | ORAL | 0 refills | Status: DC

## 2021-07-03 MED ORDER — OXYCODONE HCL 10 MG PO TABS: 10.0000 mg | ORAL_TABLET | Freq: Four times a day (QID) | ORAL | 0 refills | Status: DC | PRN
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
## 2021-07-03 NOTE — Progress Notes (Signed)
Provided one complimentary case of ensure plus. 

## 2021-07-03 NOTE — Progress Notes (Unsigned)
Pt cannot get PET Dotatate Scan until 28 days post last Sandostatin injection which was given 06/12/2021 per Roderic Ovens in Nuclear Medicine.  Pt can be scheduled for PET after 07/11/2022.  Roderic Ovens will call Dr. Ernestina Penna office when pt is scheduled.

## 2021-07-03 NOTE — Progress Notes (Signed)
Patient Name  Meredith Goodman, Meredith Goodman Legal Sex  Female DOB  09/01/1955 SSN  [id number] Address  [place] [address] Phone  [phone] [facility])  [phone] (Mobile) *Preferred*    RE: US Liver Biopsy Received: Today Criselda Peaches, MD  Garth Bigness D Approved, US guided core biopsy liver lesion.  Almost entire liver replaced with tumor.  Hx ileal carcinoid.   HKM        Previous Messages   ----- Message -----  From: Garth Bigness D  Sent: 07/03/2021   4:41 PM EST  To: Ir Procedure Requests  Subject: US Liver Biopsy                                 Procedure:  US Liver biopsy   Reason:  Malignant carcinoid tumor of ileum, new liver lesions, confirm malignancy   History:  CT, NM in computer   Provider:  Truitt Merle   Provider Contact:  [phone]

## 2021-07-04 ENCOUNTER — Other Ambulatory Visit: Payer: Self-pay

## 2021-07-10 ENCOUNTER — Other Ambulatory Visit: Payer: Self-pay

## 2021-07-10 ENCOUNTER — Inpatient Hospital Stay: Payer: Medicaid Other

## 2021-07-10 ENCOUNTER — Ambulatory Visit: Payer: Self-pay

## 2021-07-10 ENCOUNTER — Ambulatory Visit: Payer: Self-pay | Admitting: Hematology

## 2021-07-10 DIAGNOSIS — C7A012 Malignant carcinoid tumor of the ileum: Secondary | ICD-10-CM

## 2021-07-10 LAB — CMP (CANCER CENTER ONLY)
ALT: 78 U/L — ABNORMAL HIGH (ref 0–44)
AST: 72 U/L — ABNORMAL HIGH (ref 15–41)
Albumin: 3.3 g/dL — ABNORMAL LOW (ref 3.5–5.0)
Alkaline Phosphatase: 343 U/L — ABNORMAL HIGH (ref 38–126)
Anion gap: 7 (ref 5–15)
BUN: 27 mg/dL — ABNORMAL HIGH (ref 8–23)
CO2: 25 mmol/L (ref 22–32)
Calcium: 8.8 mg/dL — ABNORMAL LOW (ref 8.9–10.3)
Chloride: 106 mmol/L (ref 98–111)
Creatinine: 0.98 mg/dL (ref 0.44–1.00)
GFR, Estimated: >60 mL/min (ref >60)
Glucose, Bld: 101 mg/dL — ABNORMAL HIGH (ref 70–99)
Potassium: 3.3 mmol/L — ABNORMAL LOW (ref 3.5–5.1)
Sodium: 138 mmol/L (ref 135–145)
Total Bilirubin: 5.5 mg/dL (ref 0.3–1.2)
Total Protein: 6 g/dL — ABNORMAL LOW (ref 6.5–8.1)

## 2021-07-10 LAB — CBC WITH DIFFERENTIAL (CANCER CENTER ONLY)
Abs Immature Granulocytes: 0.69 10*3/uL — ABNORMAL HIGH (ref 0.00–0.07)
Basophils Absolute: 0.1 10*3/uL (ref 0.0–0.1)
Basophils Relative: 1 %
Eosinophils Absolute: 0.1 10*3/uL (ref 0.0–0.5)
Eosinophils Relative: 1 %
HCT: 31 % — ABNORMAL LOW (ref 36.0–46.0)
Hemoglobin: 10.7 g/dL — ABNORMAL LOW (ref 12.0–15.0)
Immature Granulocytes: 8 %
Lymphocytes Relative: 21 %
Lymphs Abs: 1.8 10*3/uL (ref 0.7–4.0)
MCH: 31.6 pg (ref 26.0–34.0)
MCHC: 34.5 g/dL (ref 30.0–36.0)
MCV: 91.4 fL (ref 80.0–100.0)
Monocytes Absolute: 1.3 10*3/uL — ABNORMAL HIGH (ref 0.1–1.0)
Monocytes Relative: 14 %
Neutro Abs: 5 10*3/uL (ref 1.7–7.7)
Neutrophils Relative %: 55 %
Platelet Count: 104 10*3/uL — ABNORMAL LOW (ref 150–400)
RBC: 3.39 MIL/uL — ABNORMAL LOW (ref 3.87–5.11)
RDW: 18.2 % — ABNORMAL HIGH (ref 11.5–15.5)
WBC Count: 8.9 10*3/uL (ref 4.0–10.5)
nRBC: 2.8 % — ABNORMAL HIGH (ref 0.0–0.2)

## 2021-07-11 ENCOUNTER — Ambulatory Visit (HOSPITAL_COMMUNITY)
Admission: RE | Admit: 2021-07-11 | Discharge: 2021-07-11 | Disposition: A | Discharge status: Home / Self Care | Payer: Medicaid Other | Source: Ambulatory Visit | Attending: Hematology | Admitting: Hematology

## 2021-07-11 ENCOUNTER — Other Ambulatory Visit: Payer: Self-pay | Admitting: Student

## 2021-07-11 DIAGNOSIS — C787 Secondary malignant neoplasm of liver and intrahepatic bile duct: Secondary | ICD-10-CM | POA: Diagnosis not present

## 2021-07-11 DIAGNOSIS — R109 Unspecified abdominal pain: Secondary | ICD-10-CM | POA: Diagnosis present

## 2021-07-11 DIAGNOSIS — Z8589 Personal history of malignant neoplasm of other organs and systems: Secondary | ICD-10-CM | POA: Diagnosis not present

## 2021-07-11 DIAGNOSIS — C7A012 Malignant carcinoid tumor of the ileum: Secondary | ICD-10-CM | POA: Insufficient documentation

## 2021-07-11 DIAGNOSIS — R0609 Other forms of dyspnea: Secondary | ICD-10-CM | POA: Diagnosis present

## 2021-07-11 LAB — PROTIME-INR
INR: 1.4 — ABNORMAL HIGH (ref 0.8–1.2)
Prothrombin Time: 17 seconds — ABNORMAL HIGH (ref 11.4–15.2)

## 2021-07-11 LAB — CBC
HCT: 32.5 % — ABNORMAL LOW (ref 36.0–46.0)
Hemoglobin: 11.1 g/dL — ABNORMAL LOW (ref 12.0–15.0)
MCH: 31.4 pg (ref 26.0–34.0)
MCHC: 34.2 g/dL (ref 30.0–36.0)
MCV: 91.8 fL (ref 80.0–100.0)
Platelets: 108 10*3/uL — ABNORMAL LOW (ref 150–400)
RBC: 3.54 MIL/uL — ABNORMAL LOW (ref 3.87–5.11)
RDW: 18.1 % — ABNORMAL HIGH (ref 11.5–15.5)
WBC: 12.4 10*3/uL — ABNORMAL HIGH (ref 4.0–10.5)
nRBC: 2.5 % — ABNORMAL HIGH (ref 0.0–0.2)

## 2021-07-11 MED ORDER — LIDOCAINE HCL (PF) 1 % IJ SOLN
INTRAMUSCULAR | Status: AC
  Filled 2021-07-11: qty 30

## 2021-07-11 MED ORDER — MIDAZOLAM HCL 2 MG/2ML IJ SOLN
INTRAMUSCULAR | Status: AC
  Filled 2021-07-11: qty 2

## 2021-07-11 MED ORDER — MIDAZOLAM HCL 2 MG/2ML IJ SOLN
INTRAMUSCULAR | Status: AC | PRN
  Administered 2021-07-11: 1 mg via INTRAVENOUS

## 2021-07-11 MED ORDER — GELATIN ABSORBABLE 12-7 MM EX MISC
CUTANEOUS | Status: AC
  Filled 2021-07-11: qty 1

## 2021-07-11 MED ORDER — FENTANYL CITRATE (PF) 100 MCG/2ML IJ SOLN
INTRAMUSCULAR | Status: AC | PRN
Start: 2021-07-11 — End: 2021-07-11
  Administered 2021-07-11 (×2): 25 ug via INTRAVENOUS

## 2021-07-11 MED ORDER — FENTANYL CITRATE (PF) 100 MCG/2ML IJ SOLN
INTRAMUSCULAR | Status: AC
  Filled 2021-07-11: qty 2

## 2021-07-11 MED ORDER — SODIUM CHLORIDE 0.9 % IV SOLN: INTRAVENOUS | Status: DC

## 2021-07-11 NOTE — Procedures (Signed)
Interventional Radiology Procedure Note  Procedure: US guided liver bx  Complications: None  Estimated Blood Loss: None  Recommendations: - Bedrest x 2 hrs - DC home   Signed,  Criselda Peaches, MD
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
## 2021-07-16 ENCOUNTER — Other Ambulatory Visit: Payer: Self-pay | Admitting: Hematology

## 2021-07-16 ENCOUNTER — Encounter: Payer: Self-pay | Admitting: *Deleted

## 2021-07-16 DIAGNOSIS — C801 Malignant (primary) neoplasm, unspecified: Secondary | ICD-10-CM | POA: Insufficient documentation

## 2021-07-16 LAB — SURGICAL PATHOLOGY

## 2021-07-16 NOTE — Progress Notes (Signed)
Ok to start premedications without lab results per Dr Burr Medico.

## 2021-07-17 ENCOUNTER — Other Ambulatory Visit: Payer: Self-pay

## 2021-07-17 ENCOUNTER — Other Ambulatory Visit: Payer: Self-pay | Admitting: Hematology

## 2021-07-17 ENCOUNTER — Inpatient Hospital Stay: Payer: Commercial Managed Care - HMO

## 2021-07-17 ENCOUNTER — Encounter: Payer: Self-pay | Admitting: Hematology

## 2021-07-17 ENCOUNTER — Inpatient Hospital Stay: Payer: Commercial Managed Care - HMO | Attending: Hematology

## 2021-07-17 ENCOUNTER — Inpatient Hospital Stay (HOSPITAL_BASED_OUTPATIENT_CLINIC_OR_DEPARTMENT_OTHER): Payer: Commercial Managed Care - HMO | Admitting: Hematology

## 2021-07-17 VITALS — BP 125/81 | HR 79 | Temp 98.4°F | Resp 20 | Ht 62.0 in | Wt 104.0 lb

## 2021-07-17 DIAGNOSIS — E559 Vitamin D deficiency, unspecified: Secondary | ICD-10-CM | POA: Insufficient documentation

## 2021-07-17 DIAGNOSIS — Z79899 Other long term (current) drug therapy: Secondary | ICD-10-CM | POA: Insufficient documentation

## 2021-07-17 DIAGNOSIS — Z7984 Long term (current) use of oral hypoglycemic drugs: Secondary | ICD-10-CM | POA: Diagnosis not present

## 2021-07-17 DIAGNOSIS — Z5111 Encounter for antineoplastic chemotherapy: Secondary | ICD-10-CM | POA: Insufficient documentation

## 2021-07-17 DIAGNOSIS — E7800 Pure hypercholesterolemia, unspecified: Secondary | ICD-10-CM | POA: Insufficient documentation

## 2021-07-17 DIAGNOSIS — I700 Atherosclerosis of aorta: Secondary | ICD-10-CM | POA: Insufficient documentation

## 2021-07-17 DIAGNOSIS — K5730 Diverticulosis of large intestine without perforation or abscess without bleeding: Secondary | ICD-10-CM | POA: Diagnosis not present

## 2021-07-17 DIAGNOSIS — I7121 Aneurysm of the ascending aorta, without rupture: Secondary | ICD-10-CM | POA: Insufficient documentation

## 2021-07-17 DIAGNOSIS — I10 Essential (primary) hypertension: Secondary | ICD-10-CM | POA: Insufficient documentation

## 2021-07-17 DIAGNOSIS — C801 Malignant (primary) neoplasm, unspecified: Secondary | ICD-10-CM

## 2021-07-17 DIAGNOSIS — E119 Type 2 diabetes mellitus without complications: Secondary | ICD-10-CM | POA: Insufficient documentation

## 2021-07-17 DIAGNOSIS — K621 Rectal polyp: Secondary | ICD-10-CM | POA: Insufficient documentation

## 2021-07-17 DIAGNOSIS — D250 Submucous leiomyoma of uterus: Secondary | ICD-10-CM | POA: Insufficient documentation

## 2021-07-17 DIAGNOSIS — R112 Nausea with vomiting, unspecified: Secondary | ICD-10-CM | POA: Diagnosis not present

## 2021-07-17 DIAGNOSIS — C7A012 Malignant carcinoid tumor of the ileum: Secondary | ICD-10-CM | POA: Diagnosis present

## 2021-07-17 DIAGNOSIS — K219 Gastro-esophageal reflux disease without esophagitis: Secondary | ICD-10-CM | POA: Diagnosis not present

## 2021-07-17 DIAGNOSIS — C787 Secondary malignant neoplasm of liver and intrahepatic bile duct: Secondary | ICD-10-CM | POA: Diagnosis not present

## 2021-07-17 LAB — CBC WITH DIFFERENTIAL (CANCER CENTER ONLY)
Abs Immature Granulocytes: 0.94 10*3/uL — ABNORMAL HIGH (ref 0.00–0.07)
Basophils Absolute: 0.1 10*3/uL (ref 0.0–0.1)
Basophils Relative: 0 %
Eosinophils Absolute: 0 10*3/uL (ref 0.0–0.5)
Eosinophils Relative: 0 %
HCT: 27.9 % — ABNORMAL LOW (ref 36.0–46.0)
Hemoglobin: 9.8 g/dL — ABNORMAL LOW (ref 12.0–15.0)
Immature Granulocytes: 5 %
Lymphocytes Relative: 12 %
Lymphs Abs: 2.2 10*3/uL (ref 0.7–4.0)
MCH: 31.3 pg (ref 26.0–34.0)
MCHC: 35.1 g/dL (ref 30.0–36.0)
MCV: 89.1 fL (ref 80.0–100.0)
Monocytes Absolute: 1.2 10*3/uL — ABNORMAL HIGH (ref 0.1–1.0)
Monocytes Relative: 7 %
Neutro Abs: 13.8 10*3/uL — ABNORMAL HIGH (ref 1.7–7.7)
Neutrophils Relative %: 76 %
Platelet Count: 98 10*3/uL — ABNORMAL LOW (ref 150–400)
RBC: 3.13 MIL/uL — ABNORMAL LOW (ref 3.87–5.11)
RDW: 18.7 % — ABNORMAL HIGH (ref 11.5–15.5)
WBC Count: 18.2 10*3/uL — ABNORMAL HIGH (ref 4.0–10.5)
nRBC: 2.6 % — ABNORMAL HIGH (ref 0.0–0.2)

## 2021-07-17 LAB — CMP (CANCER CENTER ONLY)
ALT: 92 U/L — ABNORMAL HIGH (ref 0–44)
AST: 112 U/L — ABNORMAL HIGH (ref 15–41)
Albumin: 3.1 g/dL — ABNORMAL LOW (ref 3.5–5.0)
Alkaline Phosphatase: 448 U/L — ABNORMAL HIGH (ref 38–126)
Anion gap: 11 (ref 5–15)
BUN: 42 mg/dL — ABNORMAL HIGH (ref 8–23)
CO2: 23 mmol/L (ref 22–32)
Calcium: 9 mg/dL (ref 8.9–10.3)
Chloride: 107 mmol/L (ref 98–111)
Creatinine: 1.57 mg/dL — ABNORMAL HIGH (ref 0.44–1.00)
GFR, Estimated: 37 mL/min — ABNORMAL LOW (ref >60)
Glucose, Bld: 151 mg/dL — ABNORMAL HIGH (ref 70–99)
Potassium: 3.1 mmol/L — ABNORMAL LOW (ref 3.5–5.1)
Sodium: 141 mmol/L (ref 135–145)
Total Bilirubin: 11.4 mg/dL (ref 0.3–1.2)
Total Protein: 5.7 g/dL — ABNORMAL LOW (ref 6.5–8.1)

## 2021-07-17 MED ORDER — SODIUM CHLORIDE 0.9 % IV SOLN
150.0000 mg | Freq: Once | INTRAVENOUS | Status: AC
  Administered 2021-07-17: 150 mg via INTRAVENOUS
  Filled 2021-07-17: qty 150

## 2021-07-17 MED ORDER — SODIUM CHLORIDE 0.9 % IV SOLN: Freq: Once | INTRAVENOUS | Status: AC

## 2021-07-17 MED ORDER — PROCHLORPERAZINE MALEATE 10 MG PO TABS
10.0000 mg | ORAL_TABLET | Freq: Four times a day (QID) | ORAL | 5 refills | Status: AC | PRN
Start: 2021-07-17 — End: ?

## 2021-07-17 MED ORDER — OCTREOTIDE ACETATE 30 MG IM KIT: 30.0000 mg | PACK | Freq: Once | INTRAMUSCULAR | Status: DC

## 2021-07-17 MED ORDER — SODIUM CHLORIDE 0.9 % IV SOLN
260.0000 mg | Freq: Once | INTRAVENOUS | Status: AC
  Administered 2021-07-17: 260 mg via INTRAVENOUS
  Filled 2021-07-17: qty 26

## 2021-07-17 MED ORDER — PALONOSETRON HCL INJECTION 0.25 MG/5ML
0.2500 mg | Freq: Once | INTRAVENOUS | Status: AC
  Administered 2021-07-17: 0.25 mg via INTRAVENOUS
  Filled 2021-07-17: qty 5

## 2021-07-17 MED ORDER — SODIUM CHLORIDE 0.9 % IV SOLN
50.0000 mg/m2 | Freq: Once | INTRAVENOUS | Status: AC
  Administered 2021-07-17: 70 mg via INTRAVENOUS
  Filled 2021-07-17: qty 3.5

## 2021-07-17 MED ORDER — SODIUM CHLORIDE 0.9 % IV SOLN
10.0000 mg | Freq: Once | INTRAVENOUS | Status: AC
  Administered 2021-07-17: 10 mg via INTRAVENOUS
  Filled 2021-07-17: qty 10

## 2021-07-17 NOTE — Progress Notes (Signed)
Per Dr. Burr Medico, ok for treatment today with current/today's lab values and no Sandostain treatment.

## 2021-07-17 NOTE — Progress Notes (Signed)
..  Patient Assist/Replace for the following has been terminated. Medication: Sandostatin [place] (octreotide acetate) Reason for Termination: Patient has insurance coverage effective 07/13/2021, [place] Last DOS: 06/12/2021.  Marland KitchenJuan Quam, CPhT IV Drug Replacement Specialist [place] Phone: [phone]

## 2021-07-17 NOTE — Patient Instructions (Addendum)
[facility]  Discharge Instructions: Thank you for choosing [facility] to provide your oncology and hematology care.   If you have a lab appointment with the [facility], please go directly to the [facility] and check in at the registration area.   Wear comfortable clothing and clothing appropriate for easy access to any Portacath or PICC line.   We strive to give you quality time with your provider. You may need to reschedule your appointment if you arrive late (15 or more minutes).  Arriving late affects you and other patients whose appointments are after yours.  Also, if you miss three or more appointments without notifying the office, you may be dismissed from the clinic at the providers discretion.      For prescription refill requests, have your pharmacy contact our office and allow 72 hours for refills to be completed.    Today you received the following chemotherapy and/or immunotherapy agents: Atezolizumab (Tecentriq), Carboplatin, and Etoposide.   To help prevent nausea and vomiting after your treatment, we encourage you to take your nausea medication as directed.  BELOW ARE SYMPTOMS THAT SHOULD BE REPORTED IMMEDIATELY: *FEVER GREATER THAN 100.4 F (38 C) OR HIGHER *CHILLS OR SWEATING *NAUSEA AND VOMITING THAT IS NOT CONTROLLED WITH YOUR NAUSEA MEDICATION *UNUSUAL SHORTNESS OF BREATH *UNUSUAL BRUISING OR BLEEDING *URINARY PROBLEMS (pain or burning when urinating, or frequent urination) *BOWEL PROBLEMS (unusual diarrhea, constipation, pain near the anus) TENDERNESS IN MOUTH AND THROAT WITH OR WITHOUT PRESENCE OF ULCERS (sore throat, sores in mouth, or a toothache) UNUSUAL RASH, SWELLING OR PAIN  UNUSUAL VAGINAL DISCHARGE OR ITCHING   Items with * indicate a potential emergency and should be followed up as soon as possible or go to the Emergency Department if any problems should occur.  Please show the CHEMOTHERAPY ALERT CARD or  IMMUNOTHERAPY ALERT CARD at check-in to the Emergency Department and triage nurse.  Should you have questions after your visit or need to cancel or reschedule your appointment, please contact [facility]  Dept: [phone]  and follow the prompts.  Office hours are 8:00 a.m. to 4:30 p.m. Monday - Friday. Please note that voicemails left after 4:00 p.m. may not be returned until the following business day.  We are closed weekends and major holidays. You have access to a nurse at all times for urgent questions. Please call the main number to the clinic Dept: [phone] and follow the prompts.   For any non-urgent questions, you may also contact your provider using MyChart. We now offer e-Visits for anyone 45 and older to request care online for non-urgent symptoms. For details visit [email or web address].   Also download the MyChart app! Go to the app store, search "MyChart", open the app, select [facility], and log in with your MyChart username and password.  Due to Covid, a mask is required upon entering the hospital/clinic. If you do not have a mask, one will be given to you upon arrival. For doctor visits, patients may have 1 support person aged 79 or older with them. For treatment visits, patients cannot have anyone with them due to current Covid guidelines and our immunocompromised population.   Atezolizumab injection What is this medication? ATEZOLIZUMAB (a te zoe LIZ ue mab) is a monoclonal antibody. It is used to treat bladder cancer (urothelial cancer), liver cancer, lung cancer, and melanoma. This medicine may be used for other purposes; ask your health care provider or pharmacist if  you have questions. COMMON BRAND NAME(S): Tecentriq What should I tell my care team before I take this medication? They need to know if you have any of these conditions: autoimmune diseases like Crohn's disease, ulcerative colitis, or lupus have had or planning to have an  allogeneic stem cell transplant (uses someone else's stem cells) history of organ transplant history of radiation to the chest nervous system problems like myasthenia gravis or Guillain-Barre syndrome an unusual or allergic reaction to atezolizumab, other medicines, foods, dyes, or preservatives pregnant or trying to get pregnant breast-feeding How should I use this medication? This medicine is for infusion into a vein. It is given by a health care professional in a hospital or clinic setting. A special MedGuide will be given to you before each treatment. Be sure to read this information carefully each time. Talk to your pediatrician regarding the use of this medicine in children. Special care may be needed. Overdosage: If you think you have taken too much of this medicine contact a poison control center or emergency room at once. NOTE: This medicine is only for you. Do not share this medicine with others. What if I miss a dose? It is important not to miss your dose. Call your doctor or health care professional if you are unable to keep an appointment. What may interact with this medication? Interactions have not been studied. This list may not describe all possible interactions. Give your health care provider a list of all the medicines, herbs, non-prescription drugs, or dietary supplements you use. Also tell them if you smoke, drink alcohol, or use illegal drugs. Some items may interact with your medicine. What should I watch for while using this medication? Your condition will be monitored carefully while you are receiving this medicine. You may need blood work done while you are taking this medicine. Do not become pregnant while taking this medicine or for at least 5 months after stopping it. Women should inform their doctor if they wish to become pregnant or think they might be pregnant. There is a potential for serious side effects to an unborn child. Talk to your health care professional  or pharmacist for more information. Do not breast-feed an infant while taking this medicine or for at least 5 months after the last dose. What side effects may I notice from receiving this medication? Side effects that you should report to your doctor or health care professional as soon as possible: allergic reactions like skin rash, itching or hives, swelling of the face, lips, or tongue black, tarry stools bloody or watery diarrhea breathing problems changes in vision chest pain or chest tightness chills facial flushing fever headache signs and symptoms of high blood sugar such as dizziness; dry mouth; dry skin; fruity breath; nausea; stomach pain; increased hunger or thirst; increased urination signs and symptoms of liver injury like dark yellow or brown urine; general ill feeling or flu-like symptoms; light-colored stools; loss of appetite; nausea; right upper belly pain; unusually weak or tired; yellowing of the eyes or skin stomach pain trouble passing urine or change in the amount of urine Side effects that usually do not require medical attention (report to your doctor or health care professional if they continue or are bothersome): bone pain cough diarrhea joint pain muscle pain muscle weakness swelling of arms or legs tiredness weight loss This list may not describe all possible side effects. Call your doctor for medical advice about side effects. You may report side effects to FDA at 1-800-FDA-1088. Where  should I keep my medication? This drug is given in a hospital or clinic and will not be stored at home. NOTE: This sheet is a summary. It may not cover all possible information. If you have questions about this medicine, talk to your doctor, pharmacist, or health care provider.  2022 Elsevier/Gold Standard (2021-03-18 00:00:00)  Carboplatin injection What is this medication? CARBOPLATIN (KAR boe pla tin) is a chemotherapy drug. It targets fast dividing cells, like  cancer cells, and causes these cells to die. This medicine is used to treat ovarian cancer and many other cancers. This medicine may be used for other purposes; ask your health care provider or pharmacist if you have questions. COMMON BRAND NAME(S): Paraplatin What should I tell my care team before I take this medication? They need to know if you have any of these conditions: blood disorders hearing problems kidney disease recent or ongoing radiation therapy an unusual or allergic reaction to carboplatin, cisplatin, other chemotherapy, other medicines, foods, dyes, or preservatives pregnant or trying to get pregnant breast-feeding How should I use this medication? This drug is usually given as an infusion into a vein. It is administered in a hospital or clinic by a specially trained health care professional. Talk to your pediatrician regarding the use of this medicine in children. Special care may be needed. Overdosage: If you think you have taken too much of this medicine contact a poison control center or emergency room at once. NOTE: This medicine is only for you. Do not share this medicine with others. What if I miss a dose? It is important not to miss a dose. Call your doctor or health care professional if you are unable to keep an appointment. What may interact with this medication? medicines for seizures medicines to increase blood counts like filgrastim, pegfilgrastim, sargramostim some antibiotics like amikacin, gentamicin, neomycin, streptomycin, tobramycin vaccines Talk to your doctor or health care professional before taking any of these medicines: acetaminophen aspirin ibuprofen ketoprofen naproxen This list may not describe all possible interactions. Give your health care provider a list of all the medicines, herbs, non-prescription drugs, or dietary supplements you use. Also tell them if you smoke, drink alcohol, or use illegal drugs. Some items may interact with your  medicine. What should I watch for while using this medication? Your condition will be monitored carefully while you are receiving this medicine. You will need important blood work done while you are taking this medicine. This drug may make you feel generally unwell. This is not uncommon, as chemotherapy can affect healthy cells as well as cancer cells. Report any side effects. Continue your course of treatment even though you feel ill unless your doctor tells you to stop. In some cases, you may be given additional medicines to help with side effects. Follow all directions for their use. Call your doctor or health care professional for advice if you get a fever, chills or sore throat, or other symptoms of a cold or flu. Do not treat yourself. This drug decreases your body's ability to fight infections. Try to avoid being around people who are sick. This medicine may increase your risk to bruise or bleed. Call your doctor or health care professional if you notice any unusual bleeding. Be careful brushing and flossing your teeth or using a toothpick because you may get an infection or bleed more easily. If you have any dental work done, tell your dentist you are receiving this medicine. Avoid taking products that contain aspirin, acetaminophen, ibuprofen, naproxen,  or ketoprofen unless instructed by your doctor. These medicines may hide a fever. Do not become pregnant while taking this medicine. Women should inform their doctor if they wish to become pregnant or think they might be pregnant. There is a potential for serious side effects to an unborn child. Talk to your health care professional or pharmacist for more information. Do not breast-feed an infant while taking this medicine. What side effects may I notice from receiving this medication? Side effects that you should report to your doctor or health care professional as soon as possible: allergic reactions like skin rash, itching or hives, swelling  of the face, lips, or tongue signs of infection - fever or chills, cough, sore throat, pain or difficulty passing urine signs of decreased platelets or bleeding - bruising, pinpoint red spots on the skin, black, tarry stools, nosebleeds signs of decreased red blood cells - unusually weak or tired, fainting spells, lightheadedness breathing problems changes in hearing changes in vision chest pain high blood pressure low blood counts - This drug may decrease the number of white blood cells, red blood cells and platelets. You may be at increased risk for infections and bleeding. nausea and vomiting pain, swelling, redness or irritation at the injection site pain, tingling, numbness in the hands or feet problems with balance, talking, walking trouble passing urine or change in the amount of urine Side effects that usually do not require medical attention (report to your doctor or health care professional if they continue or are bothersome): hair loss loss of appetite metallic taste in the mouth or changes in taste This list may not describe all possible side effects. Call your doctor for medical advice about side effects. You may report side effects to FDA at 1-800-FDA-1088. Where should I keep my medication? This drug is given in a hospital or clinic and will not be stored at home. NOTE: This sheet is a summary. It may not cover all possible information. If you have questions about this medicine, talk to your doctor, pharmacist, or health care provider.  2022 Elsevier/Gold Standard (2007-12-07 00:00:00)  Etoposide, VP-16 injection What is this medication? ETOPOSIDE, VP-16 (e toe POE side) is a chemotherapy drug. It is used to treat testicular cancer, lung cancer, and other cancers. This medicine may be used for other purposes; ask your health care provider or pharmacist if you have questions. COMMON BRAND NAME(S): Etopophos, Toposar, VePesid What should I tell my care team before I take  this medication? They need to know if you have any of these conditions: infection kidney disease liver disease low blood counts, like low white cell, platelet, or red cell counts an unusual or allergic reaction to etoposide, other medicines, foods, dyes, or preservatives pregnant or trying to get pregnant breast-feeding How should I use this medication? This medicine is for infusion into a vein. It is administered in a hospital or clinic by a specially trained health care professional. Talk to your pediatrician regarding the use of this medicine in children. Special care may be needed. Overdosage: If you think you have taken too much of this medicine contact a poison control center or emergency room at once. NOTE: This medicine is only for you. Do not share this medicine with others. What if I miss a dose? It is important not to miss your dose. Call your doctor or health care professional if you are unable to keep an appointment. What may interact with this medication? This medicine may interact with the following medications: warfarin  This list may not describe all possible interactions. Give your health care provider a list of all the medicines, herbs, non-prescription drugs, or dietary supplements you use. Also tell them if you smoke, drink alcohol, or use illegal drugs. Some items may interact with your medicine. What should I watch for while using this medication? Visit your doctor for checks on your progress. This drug may make you feel generally unwell. This is not uncommon, as chemotherapy can affect healthy cells as well as cancer cells. Report any side effects. Continue your course of treatment even though you feel ill unless your doctor tells you to stop. In some cases, you may be given additional medicines to help with side effects. Follow all directions for their use. Call your doctor or health care professional for advice if you get a fever, chills or sore throat, or other  symptoms of a cold or flu. Do not treat yourself. This drug decreases your body's ability to fight infections. Try to avoid being around people who are sick. This medicine may increase your risk to bruise or bleed. Call your doctor or health care professional if you notice any unusual bleeding. Talk to your doctor about your risk of cancer. You may be more at risk for certain types of cancers if you take this medicine. Do not become pregnant while taking this medicine or for at least 6 months after stopping it. Women should inform their doctor if they wish to become pregnant or think they might be pregnant. Women of child-bearing potential will need to have a negative pregnancy test before starting this medicine. There is a potential for serious side effects to an unborn child. Talk to your health care professional or pharmacist for more information. Do not breast-feed an infant while taking this medicine. Men must use a latex condom during sexual contact with a woman while taking this medicine and for at least 4 months after stopping it. A latex condom is needed even if you have had a vasectomy. Contact your doctor right away if your partner becomes pregnant. Do not donate sperm while taking this medicine and for at least 4 months after you stop taking this medicine. Men should inform their doctors if they wish to father a child. This medicine may lower sperm counts. What side effects may I notice from receiving this medication? Side effects that you should report to your doctor or health care professional as soon as possible: allergic reactions like skin rash, itching or hives, swelling of the face, lips, or tongue low blood counts - this medicine may decrease the number of white blood cells, red blood cells, and platelets. You may be at increased risk for infections and bleeding nausea, vomiting redness, blistering, peeling or loosening of the skin, including inside the mouth signs and symptoms of  infection like fever; chills; cough; sore throat; pain or trouble passing urine signs and symptoms of low red blood cells or anemia such as unusually weak or tired; feeling faint or lightheaded; falls; breathing problems unusual bruising or bleeding Side effects that usually do not require medical attention (report to your doctor or health care professional if they continue or are bothersome): changes in taste diarrhea hair loss loss of appetite mouth sores This list may not describe all possible side effects. Call your doctor for medical advice about side effects. You may report side effects to FDA at 1-800-FDA-1088. Where should I keep my medication? This drug is given in a hospital or clinic and will not be  stored at home. NOTE: This sheet is a summary. It may not cover all possible information. If you have questions about this medicine, talk to your doctor, pharmacist, or health care provider.  2022 Elsevier/Gold Standard (2021-03-18 00:00:00)

## 2021-07-17 NOTE — Progress Notes (Signed)
MD cancelled Sandostatin treatment.  Sandostatin order discontinued.  Raul Del [place], [place], BCPS, BCOP 07/17/2021 1:13 PM

## 2021-07-17 NOTE — Progress Notes (Signed)
Ok to treat with Tbili 11.4. MD decreasing Etoposide dose 50% due to elevated Tbili.  Raul Del [place], [place], BCPS, BCOP 07/17/2021 9:17 AM

## 2021-07-17 NOTE — Progress Notes (Signed)
Changed Udenyca to Ziextenzo per insurance preferred biosimilar per PA team.  Acquanetta Belling, RPH, BCPS, BCOP 07/17/2021 12:14 PM
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
## 2021-07-18 ENCOUNTER — Inpatient Hospital Stay: Payer: Commercial Managed Care - HMO

## 2021-07-18 ENCOUNTER — Telehealth: Payer: Self-pay | Admitting: General Practice

## 2021-07-18 ENCOUNTER — Telehealth: Payer: Self-pay | Admitting: Hematology

## 2021-07-18 ENCOUNTER — Other Ambulatory Visit: Payer: Self-pay

## 2021-07-18 VITALS — BP 119/77 | HR 92 | Temp 98.4°F | Resp 20

## 2021-07-18 DIAGNOSIS — C7A012 Malignant carcinoid tumor of the ileum: Secondary | ICD-10-CM | POA: Diagnosis not present

## 2021-07-18 DIAGNOSIS — C801 Malignant (primary) neoplasm, unspecified: Secondary | ICD-10-CM

## 2021-07-18 MED ORDER — SODIUM CHLORIDE 0.9 % IV SOLN: Freq: Once | INTRAVENOUS | Status: AC

## 2021-07-18 MED ORDER — SODIUM CHLORIDE 0.9 % IV SOLN
50.0000 mg/m2 | Freq: Once | INTRAVENOUS | Status: AC
  Administered 2021-07-19: 70 mg via INTRAVENOUS
  Filled 2021-07-18 (×2): qty 3.5

## 2021-07-18 MED ORDER — SODIUM CHLORIDE 0.9 % IV SOLN
50.0000 mg/m2 | Freq: Once | INTRAVENOUS | Status: AC
  Administered 2021-07-18: 70 mg via INTRAVENOUS
  Filled 2021-07-18: qty 3.5

## 2021-07-18 MED ORDER — SODIUM CHLORIDE 0.9 % IV SOLN
1200.0000 mg | Freq: Once | INTRAVENOUS | Status: AC
  Administered 2021-07-18: 1200 mg via INTRAVENOUS
  Filled 2021-07-18: qty 20

## 2021-07-18 MED ORDER — SODIUM CHLORIDE 0.9 % IV SOLN
10.0000 mg | Freq: Once | INTRAVENOUS | Status: AC
  Administered 2021-07-18: 10 mg via INTRAVENOUS
  Filled 2021-07-18: qty 10

## 2021-07-18 MED FILL — Dexamethasone Sodium Phosphate Inj 100 MG/10ML: INTRAMUSCULAR | Qty: 1 | Status: AC

## 2021-07-18 NOTE — Telephone Encounter (Signed)
[place] CSW Progress Notes  Call from daughter, Dewitt Rota, wants to "check on status of disability application that you guys are working on."  Clarified that disability assistance is provided through [facility] - referral was made several months ago, [employer] has been unablle to reach patient or daughter.  In 03/2021, CSW provided daughter w [facility] contact information and also asked [place] to follow up w family.  Per daughter, they have not connected w agency. Encouraged daughter to call [facility] and/or initiate application online.  Edwyna Shell, LCSW Clinical Social Worker Phone:  [phone]

## 2021-07-18 NOTE — Patient Instructions (Addendum)
[facility]  Discharge Instructions: Thank you for choosing [facility] to provide your oncology and hematology care.   If you have a lab appointment with the [facility], please go directly to the [facility] and check in at the registration area.   Wear comfortable clothing and clothing appropriate for easy access to any Portacath or PICC line.   We strive to give you quality time with your provider. You may need to reschedule your appointment if you arrive late (15 or more minutes).  Arriving late affects you and other patients whose appointments are after yours.  Also, if you miss three or more appointments without notifying the office, you may be dismissed from the clinic at the providers discretion.      For prescription refill requests, have your pharmacy contact our office and allow 72 hours for refills to be completed.    Today you received the following chemotherapy and/or immunotherapy agent: Etoposide     To help prevent nausea and vomiting after your treatment, we encourage you to take your nausea medication as directed.  BELOW ARE SYMPTOMS THAT SHOULD BE REPORTED IMMEDIATELY: *FEVER GREATER THAN 100.4 F (38 C) OR HIGHER *CHILLS OR SWEATING *NAUSEA AND VOMITING THAT IS NOT CONTROLLED WITH YOUR NAUSEA MEDICATION *UNUSUAL SHORTNESS OF BREATH *UNUSUAL BRUISING OR BLEEDING *URINARY PROBLEMS (pain or burning when urinating, or frequent urination) *BOWEL PROBLEMS (unusual diarrhea, constipation, pain near the anus) TENDERNESS IN MOUTH AND THROAT WITH OR WITHOUT PRESENCE OF ULCERS (sore throat, sores in mouth, or a toothache) UNUSUAL RASH, SWELLING OR PAIN  UNUSUAL VAGINAL DISCHARGE OR ITCHING   Items with * indicate a potential emergency and should be followed up as soon as possible or go to the Emergency Department if any problems should occur.  Please show the CHEMOTHERAPY ALERT CARD or IMMUNOTHERAPY ALERT CARD at check-in to  the Emergency Department and triage nurse.  Should you have questions after your visit or need to cancel or reschedule your appointment, please contact [facility]  Dept: [phone]  and follow the prompts.  Office hours are 8:00 a.m. to 4:30 p.m. Monday - Friday. Please note that voicemails left after 4:00 p.m. may not be returned until the following business day.  We are closed weekends and major holidays. You have access to a nurse at all times for urgent questions. Please call the main number to the clinic Dept: [phone] and follow the prompts.   For any non-urgent questions, you may also contact your provider using MyChart. We now offer e-Visits for anyone 64 and older to request care online for non-urgent symptoms. For details visit [email or web address].   Also download the MyChart app! Go to the app store, search "MyChart", open the app, select [facility], and log in with your MyChart username and password.  Due to Covid, a mask is required upon entering the hospital/clinic. If you do not have a mask, one will be given to you upon arrival. For doctor visits, patients may have 1 support person aged 35 or older with them. For treatment visits, patients cannot have anyone with them due to current Covid guidelines and our immunocompromised population.    Atezolizumab injection What is this medication? ATEZOLIZUMAB (a te zoe LIZ ue mab) is a monoclonal antibody. It is used to treat bladder cancer (urothelial cancer), liver cancer, lung cancer, and melanoma. This medicine may be used for other purposes; ask your health care provider or pharmacist if you  have questions. COMMON BRAND NAME(S): Tecentriq What should I tell my care team before I take this medication? They need to know if you have any of these conditions: autoimmune diseases like Crohn's disease, ulcerative colitis, or lupus have had or planning to have an allogeneic stem cell transplant (uses  someone else's stem cells) history of organ transplant history of radiation to the chest nervous system problems like myasthenia gravis or Guillain-Barre syndrome an unusual or allergic reaction to atezolizumab, other medicines, foods, dyes, or preservatives pregnant or trying to get pregnant breast-feeding How should I use this medication? This medicine is for infusion into a vein. It is given by a health care professional in a hospital or clinic setting. A special MedGuide will be given to you before each treatment. Be sure to read this information carefully each time. Talk to your pediatrician regarding the use of this medicine in children. Special care may be needed. Overdosage: If you think you have taken too much of this medicine contact a poison control center or emergency room at once. NOTE: This medicine is only for you. Do not share this medicine with others. What if I miss a dose? It is important not to miss your dose. Call your doctor or health care professional if you are unable to keep an appointment. What may interact with this medication? Interactions have not been studied. This list may not describe all possible interactions. Give your health care provider a list of all the medicines, herbs, non-prescription drugs, or dietary supplements you use. Also tell them if you smoke, drink alcohol, or use illegal drugs. Some items may interact with your medicine. What should I watch for while using this medication? Your condition will be monitored carefully while you are receiving this medicine. You may need blood work done while you are taking this medicine. Do not become pregnant while taking this medicine or for at least 5 months after stopping it. Women should inform their doctor if they wish to become pregnant or think they might be pregnant. There is a potential for serious side effects to an unborn child. Talk to your health care professional or pharmacist for more information. Do  not breast-feed an infant while taking this medicine or for at least 5 months after the last dose. What side effects may I notice from receiving this medication? Side effects that you should report to your doctor or health care professional as soon as possible: allergic reactions like skin rash, itching or hives, swelling of the face, lips, or tongue black, tarry stools bloody or watery diarrhea breathing problems changes in vision chest pain or chest tightness chills facial flushing fever headache signs and symptoms of high blood sugar such as dizziness; dry mouth; dry skin; fruity breath; nausea; stomach pain; increased hunger or thirst; increased urination signs and symptoms of liver injury like dark yellow or brown urine; general ill feeling or flu-like symptoms; light-colored stools; loss of appetite; nausea; right upper belly pain; unusually weak or tired; yellowing of the eyes or skin stomach pain trouble passing urine or change in the amount of urine Side effects that usually do not require medical attention (report to your doctor or health care professional if they continue or are bothersome): bone pain cough diarrhea joint pain muscle pain muscle weakness swelling of arms or legs tiredness weight loss This list may not describe all possible side effects. Call your doctor for medical advice about side effects. You may report side effects to FDA at 1-800-FDA-1088. Where should  I keep my medication? This drug is given in a hospital or clinic and will not be stored at home. NOTE: This sheet is a summary. It may not cover all possible information. If you have questions about this medicine, talk to your doctor, pharmacist, or health care provider.  2022 [employer] Standard (2021-03-18 00:00:00)

## 2021-07-18 NOTE — Progress Notes (Signed)
Spoke with daughter, [name] and informed her of chemotherapy appt. 07/19/21 at 0900. Requested she get an updated schedule as appt. dates and times have changed.

## 2021-07-18 NOTE — Telephone Encounter (Signed)
Scheduled follow-up appointment per 1/5 los. Patient is aware.

## 2021-07-19 ENCOUNTER — Inpatient Hospital Stay: Payer: Commercial Managed Care - HMO

## 2021-07-19 ENCOUNTER — Other Ambulatory Visit: Payer: Self-pay

## 2021-07-19 VITALS — BP 132/80 | HR 90 | Temp 97.2°F | Resp 16

## 2021-07-19 DIAGNOSIS — C801 Malignant (primary) neoplasm, unspecified: Secondary | ICD-10-CM

## 2021-07-19 DIAGNOSIS — C7A012 Malignant carcinoid tumor of the ileum: Secondary | ICD-10-CM | POA: Diagnosis not present

## 2021-07-19 MED ORDER — SODIUM CHLORIDE 0.9 % IV SOLN
10.0000 mg | Freq: Once | INTRAVENOUS | Status: AC
  Administered 2021-07-19: 10 mg via INTRAVENOUS
  Filled 2021-07-19: qty 10

## 2021-07-19 MED ORDER — SODIUM CHLORIDE 0.9 % IV SOLN: Freq: Once | INTRAVENOUS | Status: AC

## 2021-07-19 NOTE — Patient Instructions (Signed)
Etoposide, VP-16 injection What is this medication? ETOPOSIDE, VP-16 (e toe POE side) is a chemotherapy drug. It is used to treat testicular cancer, lung cancer, and other cancers. This medicine may be used for other purposes; ask your health care provider or pharmacist if you have questions. COMMON BRAND NAME(S): Etopophos, Toposar, VePesid What should I tell my care team before I take this medication? They need to know if you have any of these conditions: infection kidney disease liver disease low blood counts, like low white cell, platelet, or red cell counts an unusual or allergic reaction to etoposide, other medicines, foods, dyes, or preservatives pregnant or trying to get pregnant breast-feeding How should I use this medication? This medicine is for infusion into a vein. It is administered in a hospital or clinic by a specially trained health care professional. Talk to your pediatrician regarding the use of this medicine in children. Special care may be needed. Overdosage: If you think you have taken too much of this medicine contact a poison control center or emergency room at once. NOTE: This medicine is only for you. Do not share this medicine with others. What if I miss a dose? It is important not to miss your dose. Call your doctor or health care professional if you are unable to keep an appointment. What may interact with this medication? This medicine may interact with the following medications: warfarin This list may not describe all possible interactions. Give your health care provider a list of all the medicines, herbs, non-prescription drugs, or dietary supplements you use. Also tell them if you smoke, drink alcohol, or use illegal drugs. Some items may interact with your medicine. What should I watch for while using this medication? Visit your doctor for checks on your progress. This drug may make you feel generally unwell. This is not uncommon, as chemotherapy can affect  healthy cells as well as cancer cells. Report any side effects. Continue your course of treatment even though you feel ill unless your doctor tells you to stop. In some cases, you may be given additional medicines to help with side effects. Follow all directions for their use. Call your doctor or health care professional for advice if you get a fever, chills or sore throat, or other symptoms of a cold or flu. Do not treat yourself. This drug decreases your body's ability to fight infections. Try to avoid being around people who are sick. This medicine may increase your risk to bruise or bleed. Call your doctor or health care professional if you notice any unusual bleeding. Talk to your doctor about your risk of cancer. You may be more at risk for certain types of cancers if you take this medicine. Do not become pregnant while taking this medicine or for at least 6 months after stopping it. Women should inform their doctor if they wish to become pregnant or think they might be pregnant. Women of child-bearing potential will need to have a negative pregnancy test before starting this medicine. There is a potential for serious side effects to an unborn child. Talk to your health care professional or pharmacist for more information. Do not breast-feed an infant while taking this medicine. Men must use a latex condom during sexual contact with a woman while taking this medicine and for at least 4 months after stopping it. A latex condom is needed even if you have had a vasectomy. Contact your doctor right away if your partner becomes pregnant. Do not donate sperm while taking this  medicine and for at least 4 months after you stop taking this medicine. Men should inform their doctors if they wish to father a child. This medicine may lower sperm counts. What side effects may I notice from receiving this medication? Side effects that you should report to your doctor or health care professional as soon as  possible: allergic reactions like skin rash, itching or hives, swelling of the face, lips, or tongue low blood counts - this medicine may decrease the number of white blood cells, red blood cells, and platelets. You may be at increased risk for infections and bleeding nausea, vomiting redness, blistering, peeling or loosening of the skin, including inside the mouth signs and symptoms of infection like fever; chills; cough; sore throat; pain or trouble passing urine signs and symptoms of low red blood cells or anemia such as unusually weak or tired; feeling faint or lightheaded; falls; breathing problems unusual bruising or bleeding Side effects that usually do not require medical attention (report to your doctor or health care professional if they continue or are bothersome): changes in taste diarrhea hair loss loss of appetite mouth sores This list may not describe all possible side effects. Call your doctor for medical advice about side effects. You may report side effects to FDA at 1-800-FDA-1088. Where should I keep my medication? This drug is given in a hospital or clinic and will not be stored at home. NOTE: This sheet is a summary. It may not cover all possible information. If you have questions about this medicine, talk to your doctor, pharmacist, or health care provider.  2022 [employer]/Gold Standard (2021-03-18 00:00:00)
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
## 2021-07-21 ENCOUNTER — Inpatient Hospital Stay: Payer: Commercial Managed Care - HMO

## 2021-07-21 ENCOUNTER — Other Ambulatory Visit: Payer: Self-pay

## 2021-07-21 ENCOUNTER — Inpatient Hospital Stay: Payer: Commercial Managed Care - HMO | Admitting: Nurse Practitioner

## 2021-07-21 ENCOUNTER — Ambulatory Visit (HOSPITAL_COMMUNITY): Admission: RE | Admit: 2021-07-21 | Payer: Managed Care, Other (non HMO) | Source: Ambulatory Visit

## 2021-07-21 ENCOUNTER — Encounter: Payer: Self-pay | Admitting: Nurse Practitioner

## 2021-07-21 ENCOUNTER — Telehealth: Payer: Self-pay | Admitting: *Deleted

## 2021-07-21 VITALS — BP 117/66 | HR 109 | Temp 99.3°F | Resp 17 | Wt 107.1 lb

## 2021-07-21 DIAGNOSIS — C3411 Malignant neoplasm of upper lobe, right bronchus or lung: Secondary | ICD-10-CM | POA: Diagnosis present

## 2021-07-21 DIAGNOSIS — E7800 Pure hypercholesterolemia, unspecified: Secondary | ICD-10-CM | POA: Diagnosis present

## 2021-07-21 DIAGNOSIS — I10 Essential (primary) hypertension: Secondary | ICD-10-CM | POA: Diagnosis present

## 2021-07-21 DIAGNOSIS — Z515 Encounter for palliative care: Secondary | ICD-10-CM

## 2021-07-21 DIAGNOSIS — Z66 Do not resuscitate: Secondary | ICD-10-CM | POA: Diagnosis not present

## 2021-07-21 DIAGNOSIS — J189 Pneumonia, unspecified organism: Secondary | ICD-10-CM | POA: Diagnosis not present

## 2021-07-21 DIAGNOSIS — D6959 Other secondary thrombocytopenia: Secondary | ICD-10-CM | POA: Diagnosis present

## 2021-07-21 DIAGNOSIS — Z20822 Contact with and (suspected) exposure to covid-19: Secondary | ICD-10-CM | POA: Diagnosis present

## 2021-07-21 DIAGNOSIS — E119 Type 2 diabetes mellitus without complications: Secondary | ICD-10-CM | POA: Diagnosis present

## 2021-07-21 DIAGNOSIS — E43 Unspecified severe protein-calorie malnutrition: Secondary | ICD-10-CM | POA: Diagnosis present

## 2021-07-21 DIAGNOSIS — D61810 Antineoplastic chemotherapy induced pancytopenia: Secondary | ICD-10-CM | POA: Diagnosis present

## 2021-07-21 DIAGNOSIS — R54 Age-related physical debility: Secondary | ICD-10-CM | POA: Diagnosis present

## 2021-07-21 DIAGNOSIS — I959 Hypotension, unspecified: Secondary | ICD-10-CM | POA: Diagnosis not present

## 2021-07-21 DIAGNOSIS — Z79899 Other long term (current) drug therapy: Secondary | ICD-10-CM

## 2021-07-21 DIAGNOSIS — R627 Adult failure to thrive: Secondary | ICD-10-CM | POA: Diagnosis present

## 2021-07-21 DIAGNOSIS — Z781 Physical restraint status: Secondary | ICD-10-CM

## 2021-07-21 DIAGNOSIS — N179 Acute kidney failure, unspecified: Secondary | ICD-10-CM | POA: Diagnosis not present

## 2021-07-21 DIAGNOSIS — F1410 Cocaine abuse, uncomplicated: Secondary | ICD-10-CM | POA: Diagnosis present

## 2021-07-21 DIAGNOSIS — F1120 Opioid dependence, uncomplicated: Secondary | ICD-10-CM | POA: Diagnosis present

## 2021-07-21 DIAGNOSIS — K7040 Alcoholic hepatic failure without coma: Secondary | ICD-10-CM | POA: Diagnosis present

## 2021-07-21 DIAGNOSIS — E876 Hypokalemia: Secondary | ICD-10-CM | POA: Diagnosis not present

## 2021-07-21 DIAGNOSIS — E869 Volume depletion, unspecified: Secondary | ICD-10-CM | POA: Diagnosis not present

## 2021-07-21 DIAGNOSIS — Z7984 Long term (current) use of oral hypoglycemic drugs: Secondary | ICD-10-CM

## 2021-07-21 DIAGNOSIS — M1990 Unspecified osteoarthritis, unspecified site: Secondary | ICD-10-CM | POA: Diagnosis present

## 2021-07-21 DIAGNOSIS — R64 Cachexia: Secondary | ICD-10-CM | POA: Diagnosis present

## 2021-07-21 DIAGNOSIS — K7682 Hepatic encephalopathy: Secondary | ICD-10-CM | POA: Diagnosis present

## 2021-07-21 DIAGNOSIS — Z83438 Family history of other disorder of lipoprotein metabolism and other lipidemia: Secondary | ICD-10-CM

## 2021-07-21 DIAGNOSIS — C801 Malignant (primary) neoplasm, unspecified: Secondary | ICD-10-CM | POA: Diagnosis not present

## 2021-07-21 DIAGNOSIS — K7031 Alcoholic cirrhosis of liver with ascites: Secondary | ICD-10-CM | POA: Diagnosis present

## 2021-07-21 DIAGNOSIS — T451X5A Adverse effect of antineoplastic and immunosuppressive drugs, initial encounter: Secondary | ICD-10-CM | POA: Diagnosis present

## 2021-07-21 DIAGNOSIS — E870 Hyperosmolality and hypernatremia: Secondary | ICD-10-CM | POA: Diagnosis not present

## 2021-07-21 DIAGNOSIS — G893 Neoplasm related pain (acute) (chronic): Secondary | ICD-10-CM | POA: Diagnosis present

## 2021-07-21 DIAGNOSIS — G9341 Metabolic encephalopathy: Secondary | ICD-10-CM | POA: Diagnosis present

## 2021-07-21 DIAGNOSIS — F17210 Nicotine dependence, cigarettes, uncomplicated: Secondary | ICD-10-CM | POA: Diagnosis present

## 2021-07-21 DIAGNOSIS — Z801 Family history of malignant neoplasm of trachea, bronchus and lung: Secondary | ICD-10-CM

## 2021-07-21 DIAGNOSIS — C787 Secondary malignant neoplasm of liver and intrahepatic bile duct: Secondary | ICD-10-CM | POA: Diagnosis present

## 2021-07-21 DIAGNOSIS — Z841 Family history of disorders of kidney and ureter: Secondary | ICD-10-CM

## 2021-07-21 DIAGNOSIS — Z888 Allergy status to other drugs, medicaments and biological substances status: Secondary | ICD-10-CM

## 2021-07-21 DIAGNOSIS — J9621 Acute and chronic respiratory failure with hypoxia: Secondary | ICD-10-CM | POA: Diagnosis present

## 2021-07-21 DIAGNOSIS — D62 Acute posthemorrhagic anemia: Secondary | ICD-10-CM | POA: Diagnosis present

## 2021-07-21 DIAGNOSIS — Z681 Body mass index (BMI) 19 or less, adult: Secondary | ICD-10-CM

## 2021-07-21 DIAGNOSIS — E7220 Disorder of urea cycle metabolism, unspecified: Secondary | ICD-10-CM | POA: Diagnosis not present

## 2021-07-21 DIAGNOSIS — G894 Chronic pain syndrome: Secondary | ICD-10-CM | POA: Diagnosis present

## 2021-07-21 DIAGNOSIS — C7A012 Malignant carcinoid tumor of the ileum: Secondary | ICD-10-CM

## 2021-07-21 DIAGNOSIS — F439 Reaction to severe stress, unspecified: Secondary | ICD-10-CM | POA: Diagnosis present

## 2021-07-21 DIAGNOSIS — D689 Coagulation defect, unspecified: Secondary | ICD-10-CM | POA: Diagnosis present

## 2021-07-21 DIAGNOSIS — E872 Acidosis, unspecified: Secondary | ICD-10-CM | POA: Diagnosis not present

## 2021-07-21 DIAGNOSIS — E559 Vitamin D deficiency, unspecified: Secondary | ICD-10-CM | POA: Diagnosis present

## 2021-07-21 DIAGNOSIS — K219 Gastro-esophageal reflux disease without esophagitis: Secondary | ICD-10-CM | POA: Diagnosis present

## 2021-07-21 DIAGNOSIS — Z85038 Personal history of other malignant neoplasm of large intestine: Secondary | ICD-10-CM

## 2021-07-21 DIAGNOSIS — Z8249 Family history of ischemic heart disease and other diseases of the circulatory system: Secondary | ICD-10-CM

## 2021-07-21 DIAGNOSIS — R0602 Shortness of breath: Secondary | ICD-10-CM | POA: Diagnosis not present

## 2021-07-21 LAB — CBC WITH DIFFERENTIAL (CANCER CENTER ONLY)
Abs Immature Granulocytes: 0 10*3/uL (ref 0.00–0.07)
Band Neutrophils: 7 %
Basophils Absolute: 0 10*3/uL (ref 0.0–0.1)
Basophils Relative: 0 %
Eosinophils Absolute: 0.1 10*3/uL (ref 0.0–0.5)
Eosinophils Relative: 1 %
HCT: 25.6 % — ABNORMAL LOW (ref 36.0–46.0)
Hemoglobin: 9.3 g/dL — ABNORMAL LOW (ref 12.0–15.0)
Lymphocytes Relative: 12 %
Lymphs Abs: 0.8 10*3/uL (ref 0.7–4.0)
MCH: 31.1 pg (ref 26.0–34.0)
MCHC: 36.3 g/dL — ABNORMAL HIGH (ref 30.0–36.0)
MCV: 85.6 fL (ref 80.0–100.0)
Monocytes Absolute: 0 10*3/uL — ABNORMAL LOW (ref 0.1–1.0)
Monocytes Relative: 0 %
Neutro Abs: 5.5 10*3/uL (ref 1.7–7.7)
Neutrophils Relative %: 80 %
Platelet Count: 77 10*3/uL — ABNORMAL LOW (ref 150–400)
RBC: 2.99 MIL/uL — ABNORMAL LOW (ref 3.87–5.11)
RDW: 19.4 % — ABNORMAL HIGH (ref 11.5–15.5)
Smear Review: NORMAL
WBC Count: 6.3 10*3/uL (ref 4.0–10.5)
nRBC: 0.3 % — ABNORMAL HIGH (ref 0.0–0.2)

## 2021-07-21 LAB — CMP (CANCER CENTER ONLY)
ALT: 88 U/L — ABNORMAL HIGH (ref 0–44)
AST: 165 U/L — ABNORMAL HIGH (ref 15–41)
Albumin: 2.8 g/dL — ABNORMAL LOW (ref 3.5–5.0)
Alkaline Phosphatase: 550 U/L — ABNORMAL HIGH (ref 38–126)
Anion gap: 10 (ref 5–15)
BUN: 60 mg/dL — ABNORMAL HIGH (ref 8–23)
CO2: 19 mmol/L — ABNORMAL LOW (ref 22–32)
Calcium: 7.9 mg/dL — ABNORMAL LOW (ref 8.9–10.3)
Chloride: 112 mmol/L — ABNORMAL HIGH (ref 98–111)
Creatinine: 1.4 mg/dL — ABNORMAL HIGH (ref 0.44–1.00)
GFR, Estimated: 42 mL/min — ABNORMAL LOW (ref >60)
Glucose, Bld: 91 mg/dL (ref 70–99)
Potassium: 4.7 mmol/L (ref 3.5–5.1)
Sodium: 141 mmol/L (ref 135–145)
Total Bilirubin: 14.3 mg/dL (ref 0.3–1.2)
Total Protein: 5.3 g/dL — ABNORMAL LOW (ref 6.5–8.1)

## 2021-07-21 MED ORDER — PEGFILGRASTIM-BMEZ 6 MG/0.6ML ~~LOC~~ SOSY
6.0000 mg | PREFILLED_SYRINGE | Freq: Once | SUBCUTANEOUS | Status: AC
  Administered 2021-07-21: 6 mg via SUBCUTANEOUS
  Filled 2021-07-21: qty 0.6

## 2021-07-21 MED ORDER — OXYCODONE HCL 10 MG PO TABS: 10.0000 mg | ORAL_TABLET | Freq: Four times a day (QID) | ORAL | 0 refills | Status: AC | PRN

## 2021-07-21 NOTE — Telephone Encounter (Signed)
CRITICAL VALUE STICKER  CRITICAL VALUE: Total bili 14.3  RECEIVER (on-site recipient of call): [place] NOTIFIED: 10:39 am  MESSENGER (representative from lab): [place] [facility]  MD NOTIFIED: Dr. Burr Medico  TIME OF NOTIFICATION:10:40 am  RESPONSE:

## 2021-07-21 NOTE — Progress Notes (Signed)
Per Cira Rue NP, pt to receive only GCSF today.

## 2021-07-21 NOTE — Progress Notes (Signed)
Pt's Tbili 14.3 today - Liver Mets pt.  Dr Burr Medico notified

## 2021-07-24 ENCOUNTER — Inpatient Hospital Stay (HOSPITAL_COMMUNITY)
Admission: EM | Admit: 2021-07-24 | Discharge: 2021-08-02 | DRG: 193 | Disposition: A | Discharge status: Home / Self Care | Payer: Commercial Managed Care - HMO | Attending: Internal Medicine | Admitting: Internal Medicine

## 2021-07-24 ENCOUNTER — Ambulatory Visit (HOSPITAL_COMMUNITY): Payer: Self-pay

## 2021-07-24 ENCOUNTER — Encounter (HOSPITAL_COMMUNITY): Payer: Self-pay | Admitting: Emergency Medicine

## 2021-07-24 ENCOUNTER — Emergency Department (HOSPITAL_COMMUNITY): Payer: Commercial Managed Care - HMO

## 2021-07-24 ENCOUNTER — Other Ambulatory Visit: Payer: Self-pay

## 2021-07-24 DIAGNOSIS — R188 Other ascites: Secondary | ICD-10-CM

## 2021-07-24 DIAGNOSIS — I10 Essential (primary) hypertension: Secondary | ICD-10-CM | POA: Diagnosis present

## 2021-07-24 DIAGNOSIS — Z66 Do not resuscitate: Secondary | ICD-10-CM | POA: Diagnosis not present

## 2021-07-24 DIAGNOSIS — D61810 Antineoplastic chemotherapy induced pancytopenia: Secondary | ICD-10-CM | POA: Diagnosis present

## 2021-07-24 DIAGNOSIS — F1410 Cocaine abuse, uncomplicated: Secondary | ICD-10-CM | POA: Diagnosis present

## 2021-07-24 DIAGNOSIS — C3490 Malignant neoplasm of unspecified part of unspecified bronchus or lung: Secondary | ICD-10-CM | POA: Diagnosis not present

## 2021-07-24 DIAGNOSIS — R64 Cachexia: Secondary | ICD-10-CM | POA: Diagnosis present

## 2021-07-24 DIAGNOSIS — C7A012 Malignant carcinoid tumor of the ileum: Secondary | ICD-10-CM | POA: Diagnosis present

## 2021-07-24 DIAGNOSIS — E43 Unspecified severe protein-calorie malnutrition: Secondary | ICD-10-CM | POA: Diagnosis present

## 2021-07-24 DIAGNOSIS — E7220 Disorder of urea cycle metabolism, unspecified: Secondary | ICD-10-CM | POA: Diagnosis not present

## 2021-07-24 DIAGNOSIS — Z20822 Contact with and (suspected) exposure to covid-19: Secondary | ICD-10-CM | POA: Diagnosis present

## 2021-07-24 DIAGNOSIS — I959 Hypotension, unspecified: Secondary | ICD-10-CM | POA: Diagnosis not present

## 2021-07-24 DIAGNOSIS — J9621 Acute and chronic respiratory failure with hypoxia: Secondary | ICD-10-CM | POA: Diagnosis present

## 2021-07-24 DIAGNOSIS — R7401 Elevation of levels of liver transaminase levels: Secondary | ICD-10-CM

## 2021-07-24 DIAGNOSIS — R0602 Shortness of breath: Secondary | ICD-10-CM | POA: Diagnosis present

## 2021-07-24 DIAGNOSIS — F1010 Alcohol abuse, uncomplicated: Secondary | ICD-10-CM | POA: Diagnosis not present

## 2021-07-24 DIAGNOSIS — Z4659 Encounter for fitting and adjustment of other gastrointestinal appliance and device: Secondary | ICD-10-CM

## 2021-07-24 DIAGNOSIS — K7201 Acute and subacute hepatic failure with coma: Secondary | ICD-10-CM | POA: Diagnosis not present

## 2021-07-24 DIAGNOSIS — Z515 Encounter for palliative care: Secondary | ICD-10-CM | POA: Diagnosis not present

## 2021-07-24 DIAGNOSIS — J189 Pneumonia, unspecified organism: Secondary | ICD-10-CM | POA: Diagnosis present

## 2021-07-24 DIAGNOSIS — Z0189 Encounter for other specified special examinations: Secondary | ICD-10-CM

## 2021-07-24 DIAGNOSIS — J9601 Acute respiratory failure with hypoxia: Secondary | ICD-10-CM | POA: Insufficient documentation

## 2021-07-24 DIAGNOSIS — Z7984 Long term (current) use of oral hypoglycemic drugs: Secondary | ICD-10-CM | POA: Diagnosis not present

## 2021-07-24 DIAGNOSIS — F1120 Opioid dependence, uncomplicated: Secondary | ICD-10-CM | POA: Diagnosis present

## 2021-07-24 DIAGNOSIS — R180 Malignant ascites: Secondary | ICD-10-CM | POA: Diagnosis not present

## 2021-07-24 DIAGNOSIS — E870 Hyperosmolality and hypernatremia: Secondary | ICD-10-CM | POA: Diagnosis not present

## 2021-07-24 DIAGNOSIS — R82998 Other abnormal findings in urine: Secondary | ICD-10-CM | POA: Diagnosis not present

## 2021-07-24 DIAGNOSIS — C801 Malignant (primary) neoplasm, unspecified: Secondary | ICD-10-CM | POA: Diagnosis not present

## 2021-07-24 DIAGNOSIS — T83098A Other mechanical complication of other indwelling urethral catheter, initial encounter: Secondary | ICD-10-CM | POA: Diagnosis present

## 2021-07-24 DIAGNOSIS — F1910 Other psychoactive substance abuse, uncomplicated: Secondary | ICD-10-CM | POA: Diagnosis not present

## 2021-07-24 DIAGNOSIS — C787 Secondary malignant neoplasm of liver and intrahepatic bile duct: Secondary | ICD-10-CM | POA: Diagnosis present

## 2021-07-24 DIAGNOSIS — Z79899 Other long term (current) drug therapy: Secondary | ICD-10-CM | POA: Diagnosis not present

## 2021-07-24 DIAGNOSIS — Z85038 Personal history of other malignant neoplasm of large intestine: Secondary | ICD-10-CM | POA: Diagnosis not present

## 2021-07-24 DIAGNOSIS — Z7189 Other specified counseling: Secondary | ICD-10-CM | POA: Diagnosis not present

## 2021-07-24 DIAGNOSIS — C3411 Malignant neoplasm of upper lobe, right bronchus or lung: Secondary | ICD-10-CM | POA: Diagnosis present

## 2021-07-24 DIAGNOSIS — E872 Acidosis, unspecified: Secondary | ICD-10-CM | POA: Diagnosis not present

## 2021-07-24 DIAGNOSIS — K7682 Hepatic encephalopathy: Secondary | ICD-10-CM | POA: Diagnosis present

## 2021-07-24 DIAGNOSIS — G9340 Encephalopathy, unspecified: Secondary | ICD-10-CM | POA: Diagnosis not present

## 2021-07-24 DIAGNOSIS — Z681 Body mass index (BMI) 19 or less, adult: Secondary | ICD-10-CM | POA: Diagnosis not present

## 2021-07-24 DIAGNOSIS — L299 Pruritus, unspecified: Secondary | ICD-10-CM | POA: Diagnosis not present

## 2021-07-24 DIAGNOSIS — F17210 Nicotine dependence, cigarettes, uncomplicated: Secondary | ICD-10-CM | POA: Diagnosis not present

## 2021-07-24 DIAGNOSIS — E119 Type 2 diabetes mellitus without complications: Secondary | ICD-10-CM | POA: Diagnosis present

## 2021-07-24 DIAGNOSIS — G9341 Metabolic encephalopathy: Secondary | ICD-10-CM | POA: Diagnosis present

## 2021-07-24 DIAGNOSIS — D689 Coagulation defect, unspecified: Secondary | ICD-10-CM | POA: Diagnosis present

## 2021-07-24 DIAGNOSIS — K7040 Alcoholic hepatic failure without coma: Secondary | ICD-10-CM | POA: Diagnosis present

## 2021-07-24 DIAGNOSIS — Z85118 Personal history of other malignant neoplasm of bronchus and lung: Secondary | ICD-10-CM | POA: Diagnosis not present

## 2021-07-24 DIAGNOSIS — R531 Weakness: Secondary | ICD-10-CM | POA: Diagnosis not present

## 2021-07-24 DIAGNOSIS — N179 Acute kidney failure, unspecified: Secondary | ICD-10-CM | POA: Diagnosis not present

## 2021-07-24 LAB — COMPREHENSIVE METABOLIC PANEL
ALT: 83 U/L — ABNORMAL HIGH (ref 0–44)
ALT: 62 U/L — ABNORMAL HIGH (ref 0–44)
AST: 147 U/L — ABNORMAL HIGH (ref 15–41)
AST: 110 U/L — ABNORMAL HIGH (ref 15–41)
Albumin: 2.3 g/dL — ABNORMAL LOW (ref 3.5–5.0)
Albumin: 2.5 g/dL — ABNORMAL LOW (ref 3.5–5.0)
Alkaline Phosphatase: 630 U/L — ABNORMAL HIGH (ref 38–126)
Alkaline Phosphatase: 458 U/L — ABNORMAL HIGH (ref 38–126)
Anion gap: 6 (ref 5–15)
Anion gap: 10 (ref 5–15)
BUN: 38 mg/dL — ABNORMAL HIGH (ref 8–23)
BUN: 35 mg/dL — ABNORMAL HIGH (ref 8–23)
CO2: 18 mmol/L — ABNORMAL LOW (ref 22–32)
CO2: 17 mmol/L — ABNORMAL LOW (ref 22–32)
Calcium: 7.9 mg/dL — ABNORMAL LOW (ref 8.9–10.3)
Calcium: 7.9 mg/dL — ABNORMAL LOW (ref 8.9–10.3)
Chloride: 115 mmol/L — ABNORMAL HIGH (ref 98–111)
Chloride: 113 mmol/L — ABNORMAL HIGH (ref 98–111)
Creatinine, Ser: 0.93 mg/dL (ref 0.44–1.00)
Creatinine, Ser: 0.82 mg/dL (ref 0.44–1.00)
GFR, Estimated: >60 mL/min (ref >60)
GFR, Estimated: >60 mL/min (ref >60)
Glucose, Bld: 92 mg/dL (ref 70–99)
Glucose, Bld: 84 mg/dL (ref 70–99)
Potassium: 4.2 mmol/L (ref 3.5–5.1)
Potassium: 4.2 mmol/L (ref 3.5–5.1)
Sodium: 139 mmol/L (ref 135–145)
Sodium: 140 mmol/L (ref 135–145)
Total Bilirubin: 16.1 mg/dL — ABNORMAL HIGH (ref 0.3–1.2)
Total Bilirubin: 12.5 mg/dL — ABNORMAL HIGH (ref 0.3–1.2)
Total Protein: 5.9 g/dL — ABNORMAL LOW (ref 6.5–8.1)
Total Protein: 5.4 g/dL — ABNORMAL LOW (ref 6.5–8.1)

## 2021-07-24 LAB — AMMONIA: Ammonia: 57 umol/L — ABNORMAL HIGH (ref 9–35)

## 2021-07-24 LAB — CBC WITH DIFFERENTIAL/PLATELET
Abs Immature Granulocytes: 0.01 10*3/uL (ref 0.00–0.07)
Abs Immature Granulocytes: 0.01 10*3/uL (ref 0.00–0.07)
Basophils Absolute: 0 10*3/uL (ref 0.0–0.1)
Basophils Absolute: 0 10*3/uL (ref 0.0–0.1)
Basophils Relative: 1 %
Basophils Relative: 1 %
Eosinophils Absolute: 0 10*3/uL (ref 0.0–0.5)
Eosinophils Absolute: 0 10*3/uL (ref 0.0–0.5)
Eosinophils Relative: 1 %
Eosinophils Relative: 1 %
HCT: 24.7 % — ABNORMAL LOW (ref 36.0–46.0)
HCT: 29.3 % — ABNORMAL LOW (ref 36.0–46.0)
Hemoglobin: 8.7 g/dL — ABNORMAL LOW (ref 12.0–15.0)
Hemoglobin: 10.5 g/dL — ABNORMAL LOW (ref 12.0–15.0)
Immature Granulocytes: 1 %
Immature Granulocytes: 1 %
Lymphocytes Relative: 49 %
Lymphocytes Relative: 59 %
Lymphs Abs: 0.7 10*3/uL (ref 0.7–4.0)
Lymphs Abs: 0.7 10*3/uL (ref 0.7–4.0)
MCH: 30.5 pg (ref 26.0–34.0)
MCH: 30.8 pg (ref 26.0–34.0)
MCHC: 35.2 g/dL (ref 30.0–36.0)
MCHC: 35.8 g/dL (ref 30.0–36.0)
MCV: 86.7 fL (ref 80.0–100.0)
MCV: 85.9 fL (ref 80.0–100.0)
Monocytes Absolute: 0.1 10*3/uL (ref 0.1–1.0)
Monocytes Absolute: 0.1 10*3/uL (ref 0.1–1.0)
Monocytes Relative: 5 %
Monocytes Relative: 6 %
Neutro Abs: 0.6 10*3/uL — ABNORMAL LOW (ref 1.7–7.7)
Neutro Abs: 0.4 10*3/uL — CL (ref 1.7–7.7)
Neutrophils Relative %: 43 %
Neutrophils Relative %: 32 %
Platelets: 32 10*3/uL — ABNORMAL LOW (ref 150–400)
Platelets: 25 10*3/uL — CL (ref 150–400)
RBC: 2.85 MIL/uL — ABNORMAL LOW (ref 3.87–5.11)
RBC: 3.41 MIL/uL — ABNORMAL LOW (ref 3.87–5.11)
RDW: 20.6 % — ABNORMAL HIGH (ref 11.5–15.5)
RDW: 20.3 % — ABNORMAL HIGH (ref 11.5–15.5)
WBC: 1.5 10*3/uL — ABNORMAL LOW (ref 4.0–10.5)
WBC: 1.3 10*3/uL — CL (ref 4.0–10.5)
nRBC: 0 % (ref 0.0–0.2)
nRBC: 0 % (ref 0.0–0.2)

## 2021-07-24 LAB — LACTIC ACID, PLASMA: Lactic Acid, Venous: 1.5 mmol/L (ref 0.5–1.9)

## 2021-07-24 LAB — RESP PANEL BY RT-PCR (FLU A&B, COVID) ARPGX2
Influenza A by PCR: NEGATIVE
Influenza B by PCR: NEGATIVE
SARS Coronavirus 2 by RT PCR: NEGATIVE

## 2021-07-24 LAB — TROPONIN I (HIGH SENSITIVITY)
Troponin I (High Sensitivity): 49 ng/L — ABNORMAL HIGH (ref <18)
Troponin I (High Sensitivity): 50 ng/L — ABNORMAL HIGH (ref <18)

## 2021-07-24 LAB — PROTIME-INR
INR: 2.1 — ABNORMAL HIGH (ref 0.8–1.2)
Prothrombin Time: 23.4 seconds — ABNORMAL HIGH (ref 11.4–15.2)

## 2021-07-24 LAB — BLOOD GAS, VENOUS
Acid-base deficit: 6.5 mmol/L — ABNORMAL HIGH (ref 0.0–2.0)
Bicarbonate: 18.3 mmol/L — ABNORMAL LOW (ref 20.0–28.0)
O2 Saturation: 51.1 %
Patient temperature: 98.6
pCO2, Ven: 36 mmHg — ABNORMAL LOW (ref 44.0–60.0)
pH, Ven: 7.327 (ref 7.250–7.430)
pO2, Ven: 33.4 mmHg (ref 32.0–45.0)

## 2021-07-24 LAB — MRSA NEXT GEN BY PCR, NASAL: MRSA by PCR Next Gen: NOT DETECTED

## 2021-07-24 LAB — APTT: aPTT: 41 seconds — ABNORMAL HIGH (ref 24–36)

## 2021-07-24 MED ORDER — LACTATED RINGERS IV BOLUS (SEPSIS)
500.0000 mL | Freq: Once | INTRAVENOUS | Status: AC
  Administered 2021-07-24: 500 mL via INTRAVENOUS

## 2021-07-24 MED ORDER — IOHEXOL 350 MG/ML SOLN
80.0000 mL | Freq: Once | INTRAVENOUS | Status: AC | PRN
  Administered 2021-07-24: 80 mL via INTRAVENOUS

## 2021-07-24 MED ORDER — ORAL CARE MOUTH RINSE
15.0000 mL | Freq: Two times a day (BID) | OROMUCOSAL | Status: DC
  Administered 2021-07-24 – 2021-07-27 (×4): 15 mL via OROMUCOSAL

## 2021-07-24 MED ORDER — VANCOMYCIN HCL IN DEXTROSE 1-5 GM/200ML-% IV SOLN
1000.0000 mg | Freq: Once | INTRAVENOUS | Status: AC
Start: 2021-07-24 — End: 2021-07-24
  Administered 2021-07-24: 1000 mg via INTRAVENOUS
  Filled 2021-07-24: qty 200

## 2021-07-24 MED ORDER — CHLORHEXIDINE GLUCONATE CLOTH 2 % EX PADS
6.0000 | MEDICATED_PAD | Freq: Every day | CUTANEOUS | Status: DC
  Administered 2021-07-24 – 2021-07-30 (×7): 6 via TOPICAL

## 2021-07-24 MED ORDER — LACTULOSE ENEMA
300.0000 mL | Freq: Once | ORAL | Status: AC
  Administered 2021-07-24: 300 mL via RECTAL
  Filled 2021-07-24: qty 300

## 2021-07-24 MED ORDER — VANCOMYCIN HCL IN DEXTROSE 1-5 GM/200ML-% IV SOLN
1000.0000 mg | INTRAVENOUS | Status: DC
  Filled 2021-07-24: qty 200

## 2021-07-24 MED ORDER — SODIUM CHLORIDE 0.9 % IV SOLN
2.0000 g | Freq: Once | INTRAVENOUS | Status: AC
  Administered 2021-07-24: 2 g via INTRAVENOUS
  Filled 2021-07-24: qty 2

## 2021-07-24 MED ORDER — SODIUM CHLORIDE 0.9 % IV SOLN
2.0000 g | Freq: Two times a day (BID) | INTRAVENOUS | Status: DC
  Administered 2021-07-25: 2 g via INTRAVENOUS
  Filled 2021-07-24: qty 2

## 2021-07-24 MED ORDER — ALBUMIN HUMAN 25 % IV SOLN
12.5000 g | Freq: Four times a day (QID) | INTRAVENOUS | Status: AC
  Administered 2021-07-24 – 2021-07-25 (×4): 12.5 g via INTRAVENOUS
  Filled 2021-07-24 (×4): qty 50

## 2021-07-24 NOTE — ED Notes (Signed)
Provider notified that there was difficulty obtaining iv and blood draws, pt's veins are small and fragile.  Concern for blowing iv, reducing rate of fluid admin.
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
## 2021-07-24 NOTE — ED Notes (Signed)
Charge rn evaluated iv, will draw and flush without pain. Starting fluids

## 2021-07-24 NOTE — ED Triage Notes (Signed)
Pt here from home with c/o sob , receiving chemo for liver ca , started feeling sob a couple days ago slight tachycardiac with ems , sats 90 % on room air p4 % on 4 liters

## 2021-07-24 NOTE — ED Notes (Signed)
Pt in bed with eyes closed, pt emitting a snoring like noise, family at bedside, pt's necklace was on bedside table, necklace given to daughter at bedside.

## 2021-07-24 NOTE — ED Provider Triage Note (Signed)
Emergency Medicine Provider Triage Evaluation Note  Meredith Goodman , a 66 y.o. female  was evaluated in triage.  Pt complains of shortness of breath with exertion onset 2 days ago.  Patient has a history of liver cancer is being treated with chemo. She does not wear oxygen at baseline.  Patient on 4 L oxygen via nasal cannula upon arrival to the ED.  Patient had her last chemo treatment last Friday.  Has associated diffuse abdominal pain. She has not tried any medications for her symptoms.  Denies history of CHF, asthma, COPD.  Denies fever, chills, chest pain, abdominal pain, nausea, vomiting.  Denies wearing oxygen at home.  Review of Systems  Positive: As per HPI above Negative: Chest pain, fever, chills  Physical Exam  BP 124/79    Pulse (!) 137    Temp 98.2 F (36.8 C) (Oral)    Resp (!) 23    SpO2 94%  Gen:   Awake, no distress   Resp:  Normal effort  MSK:   Moves extremities without difficulty Other:  Abdominal distension noted. No abdominal TTP.   Medical Decision Making  Medically screening exam initiated at 12:49 PM.  Appropriate orders placed.  Meredith Goodman was informed that the remainder of the evaluation will be completed by another provider, this initial triage assessment does not replace that evaluation, and the importance of remaining in the ED until their evaluation is complete.  12:49 PM - Discussed with RN that patient is in need of a room immediately. RN aware and working on room placement.    Jayvan Mcshan A, PA-C 07/24/21 1510

## 2021-07-24 NOTE — ED Notes (Signed)
Pt reports some pain when flushing iv in L forearm, charge RN to bedside to eval.

## 2021-07-24 NOTE — ED Notes (Signed)
Iv placement unsuccessful times two, second rn at bedside to attempt.

## 2021-07-24 NOTE — Sepsis Progress Note (Signed)
Notified provider of need to order initial lactic acid and repeat lactic acid in 2hrs.

## 2021-07-24 NOTE — ED Provider Notes (Signed)
Care transferred from Theodis Blaze, PA-C at time of sign out. See their note for full assessment.   "HPI from previous provider: [name] is a 66 y.o. female with past medical history of malignant carcinoid of the ileum, small cell lung cancer, recent discovery of liver metastases currently on chemotherapy, hypertension who presents to the emergency department with shortness of breath.  Per triage note patient has had shortness of breath with exertion for 2 days. EMS was called and noted that the patient was hypoxic to 90% on room air.  Patient does not wear oxygen at baseline.  Patient was placed on 4 L nasal cannula on arrival to the ED.  On my exam the patient is drowsy but oriented. She states that she has had shortness of breath this morning.  She states that this woke her up out of her sleep and she felt significantly short of breath.  She does endorse sensation of palpitations as well as intermittent chest pain.  She states that she does not wear oxygen at home.  She states that her last chemotherapy was on Friday here at [facility].  She did not note any adverse effects that chemotherapy session.  She does have lower extremity edema that she states is more chronic in nature.  She has Band-Aids on her right upper quadrant but she is unsure what this is from.  Likely from liver biopsy she had on 07/11/2021.  Pathology revealed metastatic small cell carcinoma.  Appears that she is on etoposide, atezolizumab."   Physical Exam  BP 116/68    Pulse (!) 120    Temp 98.2 F (36.8 C) (Oral)    Resp 16    SpO2 100%   Physical Exam Vitals and nursing note reviewed.  Constitutional:      General: She is in acute distress.     Appearance: She is ill-appearing and toxic-appearing. She is not diaphoretic.  HENT:     Head: Normocephalic and atraumatic.     Mouth/Throat:     Pharynx: No oropharyngeal exudate.  Eyes:     General: Scleral icterus present.     Conjunctiva/sclera: Conjunctivae normal.   Cardiovascular:     Rate and Rhythm: Regular rhythm. Tachycardia present.     Pulses: Normal pulses.     Heart sounds: Normal heart sounds.  Pulmonary:     Effort: Pulmonary effort is normal. Tachypnea present. No respiratory distress.     Breath sounds: Rhonchi present. No wheezing.  Abdominal:     General: Bowel sounds are normal.     Palpations: Abdomen is soft. There is hepatomegaly. There is no mass.     Tenderness: There is abdominal tenderness. There is no guarding or rebound.  Musculoskeletal:        General: Normal range of motion.     Cervical back: Normal range of motion and neck supple.  Skin:    General: Skin is warm and dry.  Psychiatric:        Mood and Affect: Mood normal.        Speech: Speech normal.        Behavior: Behavior is slowed. Behavior is cooperative.     Procedures  Procedures  ED Course / MDM   Clinical Course as of 07/25/21 0039  Thu Jul 24, 2021  1531 Plan from previous PA-C at time of sign out: Admit to hospital due to new oxygen requirement and history of malignancy. Treated with chemo, last Friday.  [SB]  1722 Consult with  Hospitalist, Dr. Nevada Crane who will admit the patient. [SB]  1727 Discussed with patient family at bedside of CT PE study findings and admission to hospital. Patient asleep upon evaluation. Patient family agreeable at this time. [SB]    Clinical Course User Index [SB] Jazelle Achey A, PA-C   Labs and Imaging:  Results for orders placed or performed during the hospital encounter of 07/24/21  Resp Panel by RT-PCR (Flu A&B, Covid) Nasopharyngeal Swab   Specimen: Nasopharyngeal Swab; Nasopharyngeal(NP) swabs in vial transport medium  Result Value Ref Range   SARS Coronavirus 2 by RT PCR NEGATIVE NEGATIVE   Influenza A by PCR NEGATIVE NEGATIVE   Influenza B by PCR NEGATIVE NEGATIVE  Comprehensive metabolic panel  Result Value Ref Range   Sodium 139 135 - 145 mmol/L   Potassium 4.2 3.5 - 5.1 mmol/L   Chloride 115 (H) 98 -  111 mmol/L   CO2 18 (L) 22 - 32 mmol/L   Glucose, Bld 92 70 - 99 mg/dL   BUN 38 (H) 8 - 23 mg/dL   Creatinine, Ser 0.93 0.44 - 1.00 mg/dL   Calcium 7.9 (L) 8.9 - 10.3 mg/dL   Total Protein 5.9 (L) 6.5 - 8.1 g/dL   Albumin 2.3 (L) 3.5 - 5.0 g/dL   AST 147 (H) 15 - 41 U/L   ALT 83 (H) 0 - 44 U/L   Alkaline Phosphatase 630 (H) 38 - 126 U/L   Total Bilirubin 16.1 (H) 0.3 - 1.2 mg/dL   GFR, Estimated >60 >60 mL/min   Anion gap 6 5 - 15  CBC with Differential  Result Value Ref Range   WBC 1.5 (L) 4.0 - 10.5 K/uL   RBC 2.85 (L) 3.87 - 5.11 MIL/uL   Hemoglobin 8.7 (L) 12.0 - 15.0 g/dL   HCT 24.7 (L) 36.0 - 46.0 %   MCV 86.7 80.0 - 100.0 fL   MCH 30.5 26.0 - 34.0 pg   MCHC 35.2 30.0 - 36.0 g/dL   RDW 20.6 (H) 11.5 - 15.5 %   Platelets 32 (L) 150 - 400 K/uL   nRBC 0.0 0.0 - 0.2 %   Neutrophils Relative % PENDING %   Neutro Abs PENDING 1.7 - 7.7 K/uL   Band Neutrophils PENDING %   Lymphocytes Relative PENDING %   Lymphs Abs PENDING 0.7 - 4.0 K/uL   Monocytes Relative PENDING %   Monocytes Absolute PENDING 0.1 - 1.0 K/uL   Eosinophils Relative PENDING %   Eosinophils Absolute PENDING 0.0 - 0.5 K/uL   Basophils Relative PENDING %   Basophils Absolute PENDING 0.0 - 0.1 K/uL   WBC Morphology PENDING    RBC Morphology PENDING    Smear Review PENDING    Other PENDING %   nRBC PENDING 0 /100 WBC   Metamyelocytes Relative PENDING %   Myelocytes PENDING %   Promyelocytes Relative PENDING %   Blasts PENDING %   Immature Granulocytes PENDING %   Abs Immature Granulocytes PENDING 0.00 - 0.07 K/uL  Ammonia  Result Value Ref Range   Ammonia 57 (H) 9 - 35 umol/L  Protime-INR  Result Value Ref Range   Prothrombin Time 23.4 (H) 11.4 - 15.2 seconds   INR 2.1 (H) 0.8 - 1.2  APTT  Result Value Ref Range   aPTT 41 (H) 24 - 36 seconds  Troponin I (High Sensitivity)  Result Value Ref Range   Troponin I (High Sensitivity) 49 (H) <18 ng/L   CT Angio Chest PE W  and/or Wo Contrast  Result  Date: 07/24/2021 CLINICAL DATA:  Shortness of breath, receiving chemotherapy for liver carcinoma. EXAM: CT ANGIOGRAPHY CHEST WITH CONTRAST TECHNIQUE: Multidetector CT imaging of the chest was performed using the standard protocol during bolus administration of intravenous contrast. Multiplanar CT image reconstructions and MIPs were obtained to evaluate the vascular anatomy. RADIATION DOSE REDUCTION: This exam was performed according to the departmental dose-optimization program which includes automated exposure control, adjustment of the mA and/or kV according to patient size and/or use of iterative reconstruction technique. CONTRAST:  7m OMNIPAQUE IOHEXOL 350 MG/ML SOLN COMPARISON:  None. FINDINGS: Cardiovascular: Satisfactory opacification of the pulmonary arteries to the segmental level. No evidence of pulmonary embolism. Normal heart size. Coronary artery atherosclerotic calcifications. No pericardial effusion. Mediastinum/Nodes: Right hilar region masslike opacity which may represent lymph nodes or mass. Lungs/Pleura: Emphysematous changes of bilateral lung apices. Large right upper lobe consolidation which extends into the right hilar region and is difficult to differentiate between the lung consolidation/right hilar mass/lymph nodes. There are ground-glass opacities and fibrotic changes in the right upper lobe. Small right pleural effusion. Right lower lobe atelectasis with adjacent ground-glass opacities concerning for infectious/inflammatory process. Upper Abdomen: No acute abnormality. Enlarged liver. Evaluation of hepatic parenchyma is limited on this pulmonary angiogram. Musculoskeletal: Heterogeneous marrow density of the spine. No acute osseous abnormality. Review of the MIP images confirms the above findings. IMPRESSION: 1.  No evidence of pulmonary embolism. 2. Large right upper lobe consolidation which extends into the right hilar region. It may represent acute pneumonia with hilar  lymphadenopathy, however a lung mass with metastatic lymph nodes can not be excluded. Short-term follow-up examination is recommended. 3. Small right pleural effusion with right basilar atelectasis with adjacent ground-glass opacities concerning for infectious/inflammatory process. 4.  Emphysematous changes of bilateral lungs. 5. Heterogeneous bone density of the thoracic spine without evidence of acute osseous abnormality. Electronically Signed   By: IKeane PoliceD.O.   On: 07/24/2021 16:23   CT Abdomen Pelvis W Contrast  Result Date: 07/01/2021 CLINICAL DATA:  Abdominal pain EXAM: CT ABDOMEN AND PELVIS WITH CONTRAST TECHNIQUE: Multidetector CT imaging of the abdomen and pelvis was performed using the standard protocol following bolus administration of intravenous contrast. CONTRAST:  668mOMNIPAQUE IOHEXOL 350 MG/ML SOLN COMPARISON:  02/28/2021 PET-CT FINDINGS: Lower chest: Lung bases are free of acute infiltrate or sizable effusion. There is a 3-4 mm nodule identified in the medial aspect of the right lung base best seen on image number 24 of series 4. This was not present on the recent PET-CT from 02/28/2021. Hepatobiliary: Liver is well visualized with a few small cysts identified within the left lobe of the liver and inferiorly in the right lobe of the liver stable in appearance from the prior CT. There are however new peripherally enhancing lesions scattered throughout the liver dominant lesion in the left lobe measures 2.6 cm best seen on image number 18 of series 2. Dominant lesion on the right measures approximately 2 cm best seen on image number 32 of series 2. 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 Pancreas: Unremarkable. No pancreatic ductal dilatation or surrounding inflammatory changes. Spleen: Normal in size without focal abnormality. Adrenals/Urinary Tract:  Adrenal glands are unremarkable. Kidneys demonstrate a normal enhancement pattern bilaterally. No renal calculi or obstructive changes are seen. Normal excretion of contrast is noted bilaterally. The bladder is well distended. Stomach/Bowel: Colon  shows no obstructive or inflammatory changes. There are findings of prior resection of the right colon and proximal transverse colon seen. No small bowel obstructive changes are noted. The stomach is within normal limits. Vascular/Lymphatic: Diffuse vascular calcifications are seen. Lymphadenopathy is noted in the porta hepatis which was not well appreciated on recent PET-CT measuring up to 17 mm in short axis. No other definitive lymphadenopathy is seen. Reproductive: Uterus and bilateral adnexa are unremarkable. Other: No abdominal wall hernia or abnormality. No abdominopelvic ascites. Musculoskeletal: No acute or significant osseous findings. IMPRESSION: Multiple too numerous to count peripherally enhancing lesions within the liver consistent with metastatic disease. Associated porta hepatis lymph nodes are noted as well. 4 mm nodule in the right lung base not seen on the prior PET-CT examination and given the findings in the liver is somewhat suspicious for metastatic disease. Changes of prior right colectomy. Electronically Signed   By: Inez Catalina M.D.   On: 07/01/2021 15:42   DG Chest Port 1 View  Result Date: 07/24/2021 CLINICAL DATA:  History of metastatic neuroendocrine tumor EXAM: PORTABLE CHEST 1 VIEW COMPARISON:  PET-CT 02/28/2021 FINDINGS: Heart size is normal. Abnormal soft tissue prominence of the right hilum and right paratracheal stripe compatible with known metastatic disease. New airspace consolidation within the inferior aspect of the right upper lobe extending to the hilum which may represent atelectasis or infiltrate. Trace right pleural effusion. No pneumothorax. IMPRESSION: New airspace consolidation within the inferior aspect of the right  upper lobe extending to the hilum which may represent atelectasis or infiltrate. Electronically Signed   By: Davina Poke D.O.   On: 07/24/2021 14:54   [place] CORE BIOPSY (LIVER)  Result Date: 07/11/2021 Criselda Peaches, MD     07/11/2021  2:52 PM Interventional Radiology Procedure Note Procedure: US guided liver bx Complications: None Estimated Blood Loss: None Recommendations: - Bedrest x 2 hrs - DC home Signed, Criselda Peaches, MD      Medical Decision Making  Plan per previous PA-C, Theodis Blaze: CT PE study follow up and admit to hospital due to new oxygen requirement.   Patient is a 66 year old female with a past medical history of small cell lung cancer, malignant carcinoid of the ileum, and recent discovery of liver metastasis who presents to the ED complaining of acute shortness of breath onset 2 days ago.  Patient is receiving chemotherapy at [place] [facility] with her last session being on 07/18/2021.  Vital signs, patient afebrile, not hypoxic, not tachypneic.  Tachycardia noted. On exam, patient asleep, but arousable. Tachycardia and tachypnea noted. Scleral icterus noted. Hepatomegaly and diffuse abdominal TTP noted. Differential diagnosis includes PE, PNA, COVID/Flu, malignancy.  Labs:  I ordered, and personally interpreted labs.  The pertinent results include:  Troponin elevated at 49 and 50.  CMP with known elevated liver enzymes.  AST 147, ALT 83, alk phos 630, total bili 16.1.  Total bili is elevated from 14 however no acute change with other findings. COVID and flu negative. Coags with INR 2.1, PT 23.4, PTT 41 Ammonia elevated to 57. Blood cultures and urine cultures ordered and results pending.  Imaging: I ordered imaging studies including chest x-ray ordered and resulted prior to hand off from previous PA-C. Chest x-ray without any concerns for new pneumonia.  CT PE study ordered at time of sign out by previous PA-C. I independently visualized and  interpreted imaging which showed CT PE study notable for no acute PE however imaging findings noted below:  1.  No evidence of pulmonary embolism.  2. Large right upper lobe consolidation which extends into the right  hilar region. It may represent acute pneumonia with hilar  lymphadenopathy, however a lung mass with metastatic lymph nodes can  not be excluded. Short-term follow-up examination is recommended.  3. Small right pleural effusion with right basilar atelectasis with  adjacent ground-glass opacities concerning for  infectious/inflammatory process.  4.  Emphysematous changes of bilateral lungs.  5. Heterogeneous bone density of the thoracic spine without evidence  of acute osseous abnormality.   I agree with the radiologist interpretation  Medications:  Medications ordered by previous provider including cefepime and vancomycin for antibiotic prophylaxis due to possible sepsis. I have reviewed the patients home medicines and have made adjustments as needed  Consultations: I requested consultation with the hospitalist, Dr. Nevada Crane,  and discussed lab and imaging findings as well as pertinent plan - they recommend: Admission to the hospital.   Disposition: Patient presentation suspicious for sepsis with an unknown etiology at this time. Pt with tachycardia and new onset shortness of breath with new oxygen requirement. Also concerns for PNA versus lung mass as noted per radiology report on CT PE study. Case discussed with Attending who agrees with admission at this time. As per previous PA-C recommendations, after consideration of diagnostic results, I agree with admission at this time due to new oxygen requirement of 4L and patient history of malignancy being currently treated with chemo. Discussed with patient family members at bedside who are agreeable with admission to the hospital at this time.   This chart was dictated using voice recognition software, Dragon. Despite the best  efforts of this provider to proofread and correct errors, errors may still occur which can change documentation meaning.   Mattson Dayal A, PA-C 07/25/21 0047    Daleen Bo, MD 07/25/21 [phone]

## 2021-07-24 NOTE — Progress Notes (Signed)
Pharmacy Antibiotic Note  Meredith Goodman is a 66 y.o. female admitted on 07/24/2021 with sepsis.  Pharmacy has been consulted for Vancomycin dosing.  Plan: Vancomycin 750mg  IV q24h to target AUC 400-550 Check Vancomycin levels at steady state Increase Cefepime 2gm IV q12h for CrCl 30-60 Monitor renal function and cx data      Temp (24hrs), Avg:98.4 F (36.9 C), Min:98.2 F (36.8 C), Max:98.5 F (36.9 C)  Recent Labs  Lab 07/21/21 1001 07/24/21 1412 07/24/21 1623  WBC 6.3 1.5*  --   CREATININE 1.40* 0.93  --   LATICACIDVEN  --   --  1.5    Estimated Creatinine Clearance: 46.9 mL/min (by C-G formula based on SCr of 0.93 mg/dL).    Allergies  Allergen Reactions   Benadryl [Diphenhydramine] Other (See Comments)    Large dose causes SOB    Antimicrobials this admission: 1/12 Cefepime >>  1/12 Vancomycin >>   Dose adjustments this admission:  Microbiology results: 1/12 BCx:   1/12 MRSA PCR:   Thank you for allowing pharmacy to be a part of this patients care.  Netta Cedars PharmD 07/24/2021 7:27 PM

## 2021-07-24 NOTE — ED Provider Notes (Signed)
[facility] DEPT Provider Note   CSN: [id number] Arrival date & time: 07/24/21  1155     History  Chief Complaint  Patient presents with   Shortness of Breath    Meredith Goodman is a 66 y.o. female.  With past medical history of malignant carcinoid of the ileum, small cell lung cancer, recent discovery of liver metastases currently on chemotherapy, hypertension who presents to the emergency department with shortness of breath.  Per triage note patient has had shortness of breath with exertion for 2 days. EMS was called and noted that the patient was hypoxic to 90% on room air.  Patient does not wear oxygen at baseline.  Patient was placed on 4 L nasal cannula on arrival to the ED. On my exam the patient is drowsy but oriented.  She states that she has had shortness of breath this morning.  She states that this woke her up out of her sleep and she felt significantly short of breath.  She does endorse sensation of palpitations as well as intermittent chest pain.  She states that she does not wear oxygen at home.  She states that her last chemotherapy was on Friday here at [facility].  She did not note any adverse effects that chemotherapy session.  She does have lower extremity edema that she states is more chronic in nature.  She has Band-Aids on her right upper quadrant but she is unsure what this is from.  Likely from liver biopsy she had on 07/11/2021.  Pathology revealed metastatic small cell carcinoma.  Appears that she is on etoposide, atezolizumab.  HPI     Home Medications Prior to Admission medications   Medication Sig Start Date End Date Taking? Authorizing Provider  amLODipine (NORVASC) 5 MG tablet TAKE 1 TABLET(5 MG) BY MOUTH DAILY 04/24/21   Truitt Merle, MD  atenolol (TENORMIN) 50 MG tablet Take 50 mg by mouth daily. 02/11/21   [provider]  Blood Glucose Monitoring Suppl (FREESTYLE LITE) w/Device KIT Use up to 4 times daily as directed  03/06/21   Truitt Merle, MD  Calcium Carbonate-Vit D-Min (CALCIUM 1200 PO) Take 1,000 mg by mouth daily.    [provider]  colestipol (COLESTID) 1 g tablet Take 1 tablet (1 g total) by mouth 2 (two) times daily. 11/29/19   Nandigam, Venia Minks, MD  glucose blood (FREESTYLE LITE) test strip USE AS DIRECTED TO CHECK BLOOD GLUCOSE LEVELS 4 TIMES DAILY. 03/06/21   Truitt Merle, MD  Lancets (FREESTYLE) lancets USE AS DIRECTED TO CHECK BLOOD GLUCOSE LEVELS 4 TIMES DAILY. 03/06/21   Truitt Merle, MD  metFORMIN (GLUCOPHAGE) 500 MG tablet Take 500 mg by mouth at bedtime. 04/14/19   [provider]  morphine (MS CONTIN) 15 MG 12 hr tablet Take 1 tablet (15 mg total) by mouth every 12 (twelve) hours. 07/03/21   Truitt Merle, MD  Multiple Vitamins-Minerals (MULTIVITAMIN ADULT) CHEW Chew 1 tablet by mouth daily.     [provider]  ondansetron (ZOFRAN) 4 MG tablet Take 1 tablet (4 mg total) by mouth every 8 (eight) hours as needed for nausea or vomiting. 07/01/21   Truddie Hidden, MD  Oxycodone HCl 10 MG TABS Take 1 tablet (10 mg total) by mouth every 6 (six) hours as needed. 07/21/21   Alla Feeling, NP  potassium chloride SA (KLOR-CON) 20 MEQ tablet Take 1 tablet (20 mEq total) by mouth 2 (two) times daily. 07/16/20   Alla Feeling, NP  prochlorperazine (COMPAZINE) 10 MG tablet Take 1 tablet (10 mg total) by mouth every 6 (six) hours as needed for nausea or vomiting. 07/17/21   Truitt Merle, MD  simvastatin (ZOCOR) 20 MG tablet SMARTSIG:1 Tablet(s) By Mouth Every Evening 01/03/21   [provider]  tiZANidine (ZANAFLEX) 2 MG tablet Take 1 tablet (2 mg total) by mouth every 8 (eight) hours as needed for muscle spasms. 06/09/21   Hazel Sams, PA-C      Allergies    Benadryl [diphenhydramine]    Review of Systems   Review of Systems  Constitutional:  Negative for fever.  Respiratory:  Positive for shortness of breath.   Cardiovascular:  Positive for chest pain and leg swelling.  Negative for palpitations.  Gastrointestinal:  Positive for abdominal pain.  Musculoskeletal:  Positive for myalgias.  Psychiatric/Behavioral:  Positive for decreased concentration.   All other systems reviewed and are negative.  Physical Exam Updated Vital Signs BP (!) 163/79 (BP Location: Left Arm)    Pulse (!) 139    Temp 98.2 F (36.8 C) (Oral)    Resp 18    SpO2 98%  Physical Exam Vitals and nursing note reviewed.  Constitutional:      General: She is in acute distress.     Appearance: Normal appearance. She is ill-appearing and toxic-appearing.  HENT:     Head: Normocephalic and atraumatic.  Eyes:     General: Scleral icterus present.     Extraocular Movements: Extraocular movements intact.     Pupils: Pupils are equal, round, and reactive to light.  Cardiovascular:     Rate and Rhythm: Regular rhythm. Tachycardia present.     Pulses: Normal pulses.     Heart sounds: No murmur heard. Pulmonary:     Effort: Tachypnea present. No respiratory distress.     Breath sounds: Examination of the right-upper field reveals rhonchi. Examination of the right-middle field reveals rhonchi. Rhonchi present.  Chest:     Chest wall: No tenderness.  Abdominal:     General: Bowel sounds are normal.     Palpations: Abdomen is soft. There is hepatomegaly.     Tenderness: There is abdominal tenderness.  Musculoskeletal:        General: Tenderness present.     Cervical back: Normal range of motion.     Right lower leg: Tenderness present. Edema present.     Left lower leg: Tenderness present. Edema present.  Skin:    Capillary Refill: Capillary refill takes less than 2 seconds.     Coloration: Skin is jaundiced and mottled.     Comments: Mottling to bilateral knees  Neurological:     General: No focal deficit present.     Mental Status: She is oriented to person, place, and time. She is lethargic.     GCS: GCS eye subscore is 3. GCS verbal subscore is 5. GCS motor subscore is 6.      Motor: Weakness present.  Psychiatric:        Mood and Affect: Mood normal.        Speech: Speech normal.        Behavior: Behavior is slowed. Behavior is cooperative.    ED Results / Procedures / Treatments   Labs (all labs ordered are listed, but only abnormal results are displayed) Labs Reviewed  COMPREHENSIVE METABOLIC PANEL - Abnormal; Notable for the following components:      Result Value   Chloride 115 (*)    CO2 18 (*)  BUN 38 (*)    Calcium 7.9 (*)    Total Protein 5.9 (*)    Albumin 2.3 (*)    AST 147 (*)    ALT 83 (*)    Alkaline Phosphatase 630 (*)    Total Bilirubin 16.1 (*)    All other components within normal limits  AMMONIA - Abnormal; Notable for the following components:   Ammonia 57 (*)    All other components within normal limits  PROTIME-INR - Abnormal; Notable for the following components:   Prothrombin Time 23.4 (*)    INR 2.1 (*)    All other components within normal limits  APTT - Abnormal; Notable for the following components:   aPTT 41 (*)    All other components within normal limits  TROPONIN I (HIGH SENSITIVITY) - Abnormal; Notable for the following components:   Troponin I (High Sensitivity) 49 (*)    All other components within normal limits  RESP PANEL BY RT-PCR (FLU A&B, COVID) ARPGX2  CULTURE, BLOOD (ROUTINE X 2)  CULTURE, BLOOD (ROUTINE X 2)  URINE CULTURE  CBC WITH DIFFERENTIAL/PLATELET  URINALYSIS, ROUTINE W REFLEX MICROSCOPIC  TROPONIN I (HIGH SENSITIVITY)    EKG EKG Interpretation  Date/Time:  Thursday July 24 2021 13:57:21 EST Ventricular Rate:  123 PR Interval:  121 QRS Duration: 72 QT Interval:  286 QTC Calculation: 409 R Axis:   61 Text Interpretation: Sinus tachycardia Multiple ventricular premature complexes Probable left atrial enlargement Probable left ventricular hypertrophy Anterior Q waves, possibly due to LVH Confirmed by Dene Gentry [phone]) on 07/24/2021 2:25:27 PM  Radiology DG Chest Port 1  View  Result Date: 07/24/2021 CLINICAL DATA:  History of metastatic neuroendocrine tumor EXAM: PORTABLE CHEST 1 VIEW COMPARISON:  PET-CT 02/28/2021 FINDINGS: Heart size is normal. Abnormal soft tissue prominence of the right hilum and right paratracheal stripe compatible with known metastatic disease. New airspace consolidation within the inferior aspect of the right upper lobe extending to the hilum which may represent atelectasis or infiltrate. Trace right pleural effusion. No pneumothorax. IMPRESSION: New airspace consolidation within the inferior aspect of the right upper lobe extending to the hilum which may represent atelectasis or infiltrate. Electronically Signed   By: Davina Poke D.O.   On: 07/24/2021 14:54    Procedures .Critical Care Performed by: Mickie Hillier, PA-C Authorized by: Mickie Hillier, PA-C   Critical care provider statement:    Critical care time (minutes):  40   Critical care time was exclusive of:  Separately billable procedures and treating other patients   Critical care was necessary to treat or prevent imminent or life-threatening deterioration of the following conditions:  Sepsis, respiratory failure and hepatic failure   Critical care was time spent personally by me on the following activities:  Development of treatment plan with patient or surrogate, discussions with consultants, discussions with primary provider, evaluation of patient's response to treatment, examination of patient, interpretation of cardiac output measurements, obtaining history from patient or surrogate, review of old charts, re-evaluation of patient's condition, pulse oximetry, ordering and review of radiographic studies, ordering and review of laboratory studies and ordering and performing treatments and interventions   I assumed direction of critical care for this patient from another provider in my specialty: no     Medications Ordered in ED Medications  vancomycin (VANCOCIN) IVPB 1000  mg/200 mL premix (1,000 mg Intravenous New Bag/Given 07/24/21 1502)  lactated ringers bolus 500 mL (500 mLs Intravenous New Bag/Given 07/24/21 1503)  iohexol (OMNIPAQUE) 350  MG/ML injection 80 mL (has no administration in time range)  ceFEPIme (MAXIPIME) 2 g in sodium chloride 0.9 % 100 mL IVPB (0 g Intravenous Stopped 07/24/21 1459)    ED Course/ Medical Decision Making/ A&P Clinical Course as of 07/24/21 1533  Thu Jul 24, 2021  1531 Admit to hospital d/t new oxygen requirement and h/o malignancy. Treated with chemo, last Friday.  [SB]    Clinical Course User Index [SB] Blue, Soijett A, PA-C                           Medical Decision Making 66 year old female presents to the emergency department with shortness of breath and new oxygen requirement.  Patient is ill-appearing, tachycardic, hypoxic.  Sepsis protocol initiated. Chest x-ray does appear to have new pneumonia. Obtaining CTA PE study given current malignancy, hypoxia and tachycardia.  Pending at time of handoff. Labs -CMP with known elevated liver enzymes.  AST 147, ALT 83, alk phos 630, total bili 16.1.  All similar to previous except total bili which is elevated from 14. -COVID and flu negative -Troponin 49, repeat pending -Coags: INR 2.1, PT 23.4, PTT 41 -Ammonia is 57 Blood cultures pending, urine culture pending  LR, Vanco and cefepime initiated on arrival.  Patient will require admission.  Pending CTA PE study prior to calling hospitalist.  Patient care being handed off to [facility], PA-C at this time.  Will call for admission after PE study.  Final Clinical Impression(s) / ED Diagnoses Final diagnoses:  Shortness of breath    Rx / DC Orders ED Discharge Orders     None         Mickie Hillier, PA-C 07/24/21 1536    Valarie Merino, MD 07/27/21 1434
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
## 2021-07-24 NOTE — Sepsis Progress Note (Signed)
Code Sepsis protocol being monitored by eLink. 

## 2021-07-25 ENCOUNTER — Other Ambulatory Visit: Payer: Self-pay

## 2021-07-25 ENCOUNTER — Inpatient Hospital Stay (HOSPITAL_COMMUNITY): Payer: Commercial Managed Care - HMO

## 2021-07-25 DIAGNOSIS — C787 Secondary malignant neoplasm of liver and intrahepatic bile duct: Secondary | ICD-10-CM | POA: Diagnosis not present

## 2021-07-25 DIAGNOSIS — K7201 Acute and subacute hepatic failure with coma: Secondary | ICD-10-CM | POA: Diagnosis not present

## 2021-07-25 DIAGNOSIS — E43 Unspecified severe protein-calorie malnutrition: Secondary | ICD-10-CM | POA: Diagnosis present

## 2021-07-25 DIAGNOSIS — J9601 Acute respiratory failure with hypoxia: Secondary | ICD-10-CM

## 2021-07-25 LAB — COMPREHENSIVE METABOLIC PANEL
ALT: 60 U/L — ABNORMAL HIGH (ref 0–44)
AST: 103 U/L — ABNORMAL HIGH (ref 15–41)
Albumin: 2 g/dL — ABNORMAL LOW (ref 3.5–5.0)
Alkaline Phosphatase: 451 U/L — ABNORMAL HIGH (ref 38–126)
Anion gap: 9 (ref 5–15)
BUN: 36 mg/dL — ABNORMAL HIGH (ref 8–23)
CO2: 16 mmol/L — ABNORMAL LOW (ref 22–32)
Calcium: 7.6 mg/dL — ABNORMAL LOW (ref 8.9–10.3)
Chloride: 113 mmol/L — ABNORMAL HIGH (ref 98–111)
Creatinine, Ser: 0.78 mg/dL (ref 0.44–1.00)
GFR, Estimated: >60 mL/min (ref >60)
Glucose, Bld: 79 mg/dL (ref 70–99)
Potassium: 4 mmol/L (ref 3.5–5.1)
Sodium: 138 mmol/L (ref 135–145)
Total Bilirubin: 13.3 mg/dL — ABNORMAL HIGH (ref 0.3–1.2)
Total Protein: 4.8 g/dL — ABNORMAL LOW (ref 6.5–8.1)

## 2021-07-25 LAB — CBC WITH DIFFERENTIAL/PLATELET
Abs Immature Granulocytes: 0.01 10*3/uL (ref 0.00–0.07)
Basophils Absolute: 0 10*3/uL (ref 0.0–0.1)
Basophils Relative: 1 %
Eosinophils Absolute: 0 10*3/uL (ref 0.0–0.5)
Eosinophils Relative: 1 %
HCT: 20.2 % — ABNORMAL LOW (ref 36.0–46.0)
Hemoglobin: 7.1 g/dL — ABNORMAL LOW (ref 12.0–15.0)
Immature Granulocytes: 1 %
Lymphocytes Relative: 58 %
Lymphs Abs: 0.8 10*3/uL (ref 0.7–4.0)
MCH: 30.9 pg (ref 26.0–34.0)
MCHC: 35.1 g/dL (ref 30.0–36.0)
MCV: 87.8 fL (ref 80.0–100.0)
Monocytes Absolute: 0.1 10*3/uL (ref 0.1–1.0)
Monocytes Relative: 10 %
Neutro Abs: 0.4 10*3/uL — CL (ref 1.7–7.7)
Neutrophils Relative %: 29 %
Platelets: 23 10*3/uL — CL (ref 150–400)
RBC: 2.3 MIL/uL — ABNORMAL LOW (ref 3.87–5.11)
RDW: 21.1 % — ABNORMAL HIGH (ref 11.5–15.5)
WBC: 1.3 10*3/uL — CL (ref 4.0–10.5)
nRBC: 0 % (ref 0.0–0.2)

## 2021-07-25 LAB — RAPID URINE DRUG SCREEN, HOSP PERFORMED
Amphetamines: NOT DETECTED
Barbiturates: NOT DETECTED
Benzodiazepines: NOT DETECTED
Cocaine: POSITIVE — AB
Opiates: POSITIVE — AB
Tetrahydrocannabinol: NOT DETECTED

## 2021-07-25 LAB — CBC
HCT: 18.5 % — ABNORMAL LOW (ref 36.0–46.0)
Hemoglobin: 6.4 g/dL — CL (ref 12.0–15.0)
MCH: 30.8 pg (ref 26.0–34.0)
MCHC: 34.6 g/dL (ref 30.0–36.0)
MCV: 88.9 fL (ref 80.0–100.0)
Platelets: 22 10*3/uL — CL (ref 150–400)
RBC: 2.08 MIL/uL — ABNORMAL LOW (ref 3.87–5.11)
RDW: 20.8 % — ABNORMAL HIGH (ref 11.5–15.5)
WBC: 1.1 10*3/uL — CL (ref 4.0–10.5)
nRBC: 0 % (ref 0.0–0.2)

## 2021-07-25 LAB — PHOSPHORUS: Phosphorus: 4.2 mg/dL (ref 2.5–4.6)

## 2021-07-25 LAB — HIV ANTIBODY (ROUTINE TESTING W REFLEX): HIV Screen 4th Generation wRfx: NONREACTIVE

## 2021-07-25 LAB — MAGNESIUM: Magnesium: 2 mg/dL (ref 1.7–2.4)

## 2021-07-25 LAB — AMMONIA: Ammonia: 94 umol/L — ABNORMAL HIGH (ref 9–35)

## 2021-07-25 LAB — HEMOGLOBIN AND HEMATOCRIT, BLOOD
HCT: 25.8 % — ABNORMAL LOW (ref 36.0–46.0)
Hemoglobin: 9.3 g/dL — ABNORMAL LOW (ref 12.0–15.0)

## 2021-07-25 LAB — PROTIME-INR
INR: 2.4 — ABNORMAL HIGH (ref 0.8–1.2)
Prothrombin Time: 26 seconds — ABNORMAL HIGH (ref 11.4–15.2)

## 2021-07-25 LAB — PREPARE RBC (CROSSMATCH)

## 2021-07-25 LAB — PROCALCITONIN: Procalcitonin: 2.55 ng/mL

## 2021-07-25 MED ORDER — FOLIC ACID 1 MG PO TABS
1.0000 mg | ORAL_TABLET | Freq: Every day | ORAL | Status: DC
  Administered 2021-07-26 – 2021-07-27 (×2): 1 mg via ORAL
  Filled 2021-07-25 (×3): qty 1

## 2021-07-25 MED ORDER — VITAMIN K1 10 MG/ML IJ SOLN
5.0000 mg | Freq: Once | INTRAVENOUS | Status: AC
  Administered 2021-07-25: 5 mg via INTRAVENOUS
  Filled 2021-07-25: qty 0.5

## 2021-07-25 MED ORDER — SODIUM CHLORIDE 0.9% IV SOLUTION: Freq: Once | INTRAVENOUS | Status: AC

## 2021-07-25 MED ORDER — ATENOLOL 25 MG PO TABS
12.5000 mg | ORAL_TABLET | Freq: Every day | ORAL | Status: DC
  Administered 2021-07-25: 12.5 mg via ORAL
  Filled 2021-07-25: qty 1

## 2021-07-25 MED ORDER — ADULT MULTIVITAMIN W/MINERALS CH
1.0000 | ORAL_TABLET | Freq: Every day | ORAL | Status: DC
  Administered 2021-07-26 – 2021-07-27 (×2): 1 via ORAL
  Filled 2021-07-25 (×3): qty 1

## 2021-07-25 MED ORDER — FUROSEMIDE 10 MG/ML IJ SOLN
20.0000 mg | Freq: Every day | INTRAMUSCULAR | Status: DC
  Administered 2021-07-25 – 2021-07-26 (×2): 20 mg via INTRAVENOUS
  Filled 2021-07-25 (×2): qty 2

## 2021-07-25 MED ORDER — LACTULOSE 10 GM/15ML PO SOLN
20.0000 g | Freq: Two times a day (BID) | ORAL | Status: DC
  Administered 2021-07-25 – 2021-07-26 (×2): 20 g via ORAL
  Filled 2021-07-25 (×3): qty 30

## 2021-07-25 MED ORDER — OXYCODONE HCL 5 MG PO TABS
5.0000 mg | ORAL_TABLET | Freq: Four times a day (QID) | ORAL | Status: DC | PRN
  Administered 2021-07-25 – 2021-07-28 (×3): 5 mg via ORAL
  Filled 2021-07-25 (×3): qty 1

## 2021-07-25 MED ORDER — PIPERACILLIN-TAZOBACTAM 3.375 G IVPB
3.3750 g | Freq: Three times a day (TID) | INTRAVENOUS | Status: DC
  Administered 2021-07-25 – 2021-07-30 (×15): 3.375 g via INTRAVENOUS
  Filled 2021-07-25 (×15): qty 50

## 2021-07-25 MED ORDER — MIDODRINE HCL 5 MG PO TABS: 5.0000 mg | ORAL_TABLET | Freq: Three times a day (TID) | ORAL | Status: DC

## 2021-07-25 MED ORDER — THIAMINE HCL 100 MG PO TABS
100.0000 mg | ORAL_TABLET | Freq: Every day | ORAL | Status: DC
  Administered 2021-07-26 – 2021-07-27 (×2): 100 mg via ORAL
  Filled 2021-07-25 (×3): qty 1

## 2021-07-25 MED ORDER — LORAZEPAM 1 MG PO TABS: 1.0000 mg | ORAL_TABLET | ORAL | Status: DC | PRN

## 2021-07-25 MED ORDER — LORAZEPAM 2 MG/ML IJ SOLN
1.0000 mg | INTRAMUSCULAR | Status: DC | PRN
  Administered 2021-07-25 – 2021-07-26 (×3): 2 mg via INTRAVENOUS
  Filled 2021-07-25 (×3): qty 1

## 2021-07-25 MED ORDER — ENSURE ENLIVE PO LIQD
237.0000 mL | Freq: Three times a day (TID) | ORAL | Status: DC
  Administered 2021-07-25 – 2021-07-26 (×2): 237 mL via ORAL

## 2021-07-25 MED ORDER — THIAMINE HCL 100 MG/ML IJ SOLN: 100.0000 mg | Freq: Every day | INTRAMUSCULAR | Status: DC

## 2021-07-25 MED ORDER — MIDODRINE HCL 5 MG PO TABS: 5.0000 mg | ORAL_TABLET | Freq: Every day | ORAL | Status: DC | PRN

## 2021-07-25 MED ORDER — ATENOLOL 25 MG PO TABS
12.5000 mg | ORAL_TABLET | Freq: Two times a day (BID) | ORAL | Status: DC
  Administered 2021-07-25 – 2021-07-26 (×2): 12.5 mg via ORAL
  Filled 2021-07-25 (×2): qty 1

## 2021-07-25 MED ORDER — METOPROLOL TARTRATE 5 MG/5ML IV SOLN
2.5000 mg | Freq: Four times a day (QID) | INTRAVENOUS | Status: DC | PRN
  Administered 2021-07-25 – 2021-07-29 (×4): 2.5 mg via INTRAVENOUS
  Filled 2021-07-25 (×4): qty 5

## 2021-07-25 NOTE — Progress Notes (Addendum)
HEMATOLOGY-ONCOLOGY PROGRESS NOTE  ASSESSMENT AND PLAN: 1.  Metastatic small cell cancer 2.  Pancytopenia 3.  Sepsis with intra-abdominal source suspected 4.  Decompensated cirrhosis with ascites 5.  Metabolic encephalopathy,?  Hepatic encephalopathy 6.  Coagulopathy 7.  Acute respiratory failure with hypoxia  -The patient has been receiving palliative chemotherapy and to started first-line therapy with carboplatin/etoposide/atezolizumab.  We have previously discussed the aggressive nature of her cancer as well as the fact that it is incurable.  Chemotherapy has been dose reduced due to her liver function.  We have discussed goals of care with her before and the overall poor prognosis.  We have recommended DNR/DNI and she want to think about it.  Would recommend palliative care consult for ongoing discussion regarding goals of care. -CBC has been reviewed.  She has already received G-CSF.  Will hold on Granix at this time.  Recommend PRBC transfusion to keep hemoglobin above 7.5 recommend platelet transfusion if platelet count is less than 20,000 or active bleeding. -Continue antibiotics and follow-up on cultures. -GI has seen the patient and their note has been reviewed.  Okay to proceed with diagnostic and therapeutic paracentesis as ordered by GI.  No plans for endoscopy given thrombocytopenia. -Her INR is 2.4 this morning.  Received a dose of vitamin K 5 mg IV.  Monitor PT/INR daily.  Mikey Bussing, DNP, AGPCNP-BC, AOCNP  SUBJECTIVE: Meredith Goodman is followed by our office for metastatic small cell cancer.  She is receiving palliative therapy with carboplatin/etoposide, and atezolizumab.  She received her first cycle starting on 07/17/2021.  She received G-CSF on 07/21/2021.  Presented to the emergency department with shortness of breath.  CTA chest negative for PE but did show a large right upper lobe consolidation which extends into the right hilar region which could represent acute pneumonia with  hilar lymphadenopathy but lung mass with metastatic lymph nodes cannot be excluded.  Initial lab work showed WBC of 1.5, hemoglobin 8.7, platelet count 32,000, BUN 38, calcium 7.9, albumin 2.3, AST 147, ALT 83, alk phos 630, T bili 16.1.  Urine drug screen positive for cocaine and opiates (has been prescribed MS Contin and oxycodone by our office).  She is receiving broad-spectrum antibiotics.  This morning, WBC was 1.1, hemoglobin 6.4, platelets 22,000.  1 unit PRBCs and 1 unit of platelets have been ordered.  This morning, the patient is somewhat somnolent but can open her eyes and answer some brief questions for you.  Her daughter is at the bedside.  She states that the patient was having some abdominal discomfort but no nausea or vomiting.  She has been having some shortness of breath as well.  No bleeding reported.  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 Silverio Decamp impression - Preparation of the colon was fair. - Rule out malignancy, tumor at the ileocecal valve. Biopsied. - Rule out malignancy, tumor in the terminal ileum. Biopsied. - Severe diverticulosis in the sigmoid colon, in the descending colon, in the transverse colon, in the ascending colon and in the cecum. There was narrowing of the colon in association with the diverticular opening. There was evidence of an impacted diverticulum. There  was no evidence of diverticular bleeding. - Non-bleeding internal hemorrhoids. - Two 3 to 4 mm polyps in the rectum and in the transverse colon, removed with a cold snare. Resected and retrieved.   08/18/2017 Initial Biopsy   Diagnosis 1. Terminal ileum,  biopsy, polyp - ILEAL MUCOSA WITH MINIMAL TO MILD NONSPECIFIC ARCHITECTURAL CHANGES. SEE NOTE. - NEGATIVE FOR ACUTE INFLAMMATION, DYSPLASIA OR GRANULOMAS. 2. Ileocecal valve, biopsy, mass - LOW GRADE (GRADE 1) NEUROENDOCRINE (CARCINOID) TUMOR. - KI-67 IMMUNOHISTOCHEMICAL STAIN SHOWS A PROLIFERATION INDEX OF LESS THAN 1%, CONSISTENT WITH THE ABOVE DIAGNOSIS. SEE NOTE. 3. Colon, biopsy, transverse and rectal, polyp (2) - TUBULAR ADENOMA WITHOUT HIGH GRADE DYSPLASIA OR MALIGNANCY. - HYPERPLASTIC POLYP.   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 Enhancing 2.5 cm cecal mass at the superior margin of the ileocecal valve, compatible with known neuroendocrine tumor in this location. No bowel obstruction. 2. Clustered borderline prominent right lower quadrant mesenteric nodes, cannot exclude locoregional nodal metastases. 3. No additional potential sites of metastatic disease in the abdomen or pelvis. 4. Subcentimeter nonaggressive pancreatic body cystic lesions. Follow-up MRI abdomen without and with IV contrast is recommended in 1 year. This recommendation follows ACR consensus guidelines: Management of Incidental Pancreatic Cysts: A White Paper of the ACR Incidental Findings Committee. [place] 3374;45:146-047. 5. Submucosal small right fundal uterine fibroid.   08/26/2017 Imaging   MR ABD MRCP IMPRESSION: 1. Enhancing 2.5 cm cecal mass at the superior margin of the ileocecal valve, compatible with known neuroendocrine tumor in this location. No bowel obstruction. 2. Clustered borderline prominent right lower quadrant mesenteric nodes, cannot exclude locoregional nodal metastases. 3. No additional potential sites of metastatic disease in the abdomen or pelvis. 4. Subcentimeter nonaggressive pancreatic body cystic lesions. Follow-up MRI abdomen without and with IV contrast is recommended in 1 year. This  recommendation follows ACR consensus guidelines: Management of Incidental Pancreatic Cysts: A White Paper of the ACR Incidental Findings Committee. [place] 9987;21:587-276. 5. Submucosal small right fundal uterine fibroid.   08/30/2017 Tumor Marker   Chromogranin A: 150 (08/30/2017 pre-op) Chromogranin A: 127 (10/11/2017 post op)   09/24/2017 Surgery   [facility]lect[facility] at [facility]   09/24/2017 Pathology Results   A: Terminal ileum, cecum, appendix and right colon, right hemicolectomy - Well differentiated neuroendocrine tumor, histologic grade G1, involving ileocecal valve and ileum, multifocal, see synoptic report for additional information. 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 - Benign appendix with multiple appendiceal diverticuli. - Right colon diverticulosis. - Negative for dysplasia or carcinoma. TUMOR    Tumor Site:    Ileum: Ileocecal valve and terminal ileum     Histologic Type and Grade:    G1: Well-differentiated neuroendocrine tumor     Mitotic Rate:    < 2 mitoses / 2 mm2     Ki-67  Labeling Index:    < 3%   Tumor Size:    Greatest dimension in Centimeters (cm): 2.1 Centimeters (cm)  Tumor Focality:    Multifocal (number of tumors): 2   Tumor Extent:        Tumor Extension:    Tumor invades the submucosa   Accessory Findings:        Lymphovascular Invasion:    Present     Perineural Invasion:    Not identified     Large Mesenteric Masses (> 2 cm):  Not identified  MARGINS  Margins:    All margins are uninvolved by tumor     Margins Examined:    Proximal     Margins Examined:    Distal     Margins Examined:    Radial or mesenteric     Distance of Tumor from Closest Margin:    8.2 Centimeters (cm)    Closest Margin:    Radial or mesenteric  LYMPH NODES  Number of Lymph Nodes Involved:    5   Number of Lymph Nodes Examined:    27  PATHOLOGIC STAGE CLASSIFICATION (pTNM, AJCC 8th Edition)  Primary Tumor  (pT):    pT2   Regional Lymph Nodes (pN):    pN1  ADDITIONAL FINDINGS  Additional Pathologic Findings:    Mesenteric tumor deposit(s) <= 2 cm    09/24/2017 Cancer Staging   Staging form: Jejunum and Ileum - Neuroendocine Tumors, AJCC 8th Edition - Pathologic stage from 09/24/2017: Stage III (pT2, pN1, cM0) - Signed by Truitt Merle, MD on 01/23/2020    09/15/2018 Imaging   CT AP w contrast IMPRESSION: 1. Status post right partial colectomy without mechanical bowel obstruction nor inflammation. No evidence of local recurrence. 2. Moderate stool retention within the colon with left-sided scattered colonic diverticulosis but without acute diverticulitis. 3. Small hypodensities likely representing cysts of the liver, the largest measuring 13 mm in the right hepatic lobe. 4. Degenerative disc disease of the lumbar spine as above grade 1 anterolisthesis of L3 on L4. No acute nor suspicious osseous abnormalities.   09/14/2019 Imaging   CT AP IMPRESSION: 1. Redemonstrated postoperative findings of right hemicolectomy. No evidence of recurrent or metastatic disease in the abdomen or pelvis. 2. Multiple small low-attenuation lesions of the liver, the largest of which are clearly fluid attenuation cysts or hemangiomata and characterized as such by prior MRI, others too small to characterize. Attention on follow-up. 3. Coronary artery disease.  Aortic Atherosclerosis (ICD10-I70.0).   01/25/2020 Procedure   Colonoscopy by Dr Silverio Decamp  IMPRESSION - Patent end-to-side ileo-colonic anastomosis, characterized by healthy appearing mucosa. - The examined portion of the ileum was normal. - Diverticulosis in the sigmoid colon. - Non-bleeding internal hemorrhoids. - The examination was otherwise normal on direct and retroflexion views. - No specimens collected.   09/16/2020 Imaging   CT CAP  IMPRESSION: 1. Solid lobular 8 mm right upper lobe pulmonary nodule and 3 mm right lower lobe pulmonary nodules.  Findings which are nonspecific, attention on short-term interval follow-up CT in 3 months is recommended. 2. Similar postsurgical findings of right hemicolectomy and reanastomosis. 3. No significant change in bilobar hypodense hepatic lesions, largest of which are clearly fluid attenuating cysts or hemangiomas and were characterized as such by prior MRI the abdomen, other smaller ones are too small to accurately characterize but also favored to represent benign cysts. 4. 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 Recommend annual imaging followup by CTA or MRA. This recommendation follows 2010 ACCF/AHA/AATS/ACR/ASA/SCA/SCAI/SIR/STS/SVM Guidelines for the Diagnosis and Management of Patients with Thoracic Aortic Disease. Circulation. 2010; 121: C376-E831. Aortic aneurysm NOS (ICD10-I71.9) 5. Interval resolution of the right middle lobe opacity. 6. Colonic diverticulosis without findings of acute diverticulitis. 7. Emphysema and aortic atherosclerosis.   Aortic Atherosclerosis (ICD10-I70.0) and Emphysema (ICD10-J43.9).   12/19/2020 Imaging   CT Chest  IMPRESSION: 1. Enlarging elongated nodule in the RIGHT upper lobe is concerning for bronchial neoplasm. In Patient with history of carcinoid of the  ileum, consider neuroendocrine tumor metastasis. Recommend DOTATATE PET scan versus tissue sampling 2. Interval enlargement RIGHT paratracheal adenopathy.     02/28/2021 PET scan   IMPRESSION: 1. Intensely radiotracer avid RIGHT upper lobe pulmonary nodule consistent with metastatic well differentiated neuroendocrine tumor. 2. Intense radiotracer avid metastatic well differentiated neuroendocrine tumor adenopathy to the RIGHT paratracheal nodal station, RIGHT hilar nodal station, and RIGHT supraclavicular nodal station. 3. Post RIGHT hemicolectomy without evidence of local recurrence. 4. No evidence of metastatic disease in liver or mesentery.    07/01/2021 Imaging   EXAM: CT ABDOMEN AND PELVIS WITH CONTRAST  IMPRESSION: Multiple too numerous to count peripherally enhancing lesions within the liver consistent with metastatic disease. Associated porta hepatis lymph nodes are noted as well.   4 mm nodule in the right lung base not seen on the prior PET-CT examination and given the findings in the liver is somewhat suspicious for metastatic disease.   Changes of prior right colectomy.   07/11/2021 Pathology Results   FINAL MICROSCOPIC DIAGNOSIS:   A. LIVER, NEEDLE CORE BIOPSY:  - Metastatic small cell carcinoma, see comment  COMMENT:  Immunohistochemical stains show that the tumor cells are positive for TTF-1, synaptophysin and CD56; while they are negative for CDX2, consistent with above interpretation.  Immunostain for Ki-67 shows a proliferative index of about 40-45%.  Findings are compatible with clinical suspicion of small cell transformation of patient's known neuroendocrine tumor.    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[place]), stage IV  07/11/2021 Pathology Results   FINAL MICROSCOPIC DIAGNOSIS:   A. LIVER, NEEDLE CORE BIOPSY:  - Metastatic small cell carcinoma, see comment  COMMENT:  Immunohistochemical stains show that the tumor cells are positive for TTF-1, synaptophysin and CD56; while they are negative for CDX2, consistent with above interpretation.  Immunostain for Ki-67 shows a proliferative index of about 40-45%.  Findings are compatible with clinical suspicion of small cell transformation of patient's known neuroendocrine tumor.    07/16/2021 Initial Diagnosis   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[place])   07/17/2021 -  Chemotherapy   Patient is on Treatment Plan : LUNG SCLC Carboplatin + Etoposide + Atezolizumab Induction q21d / Atezolizumab Maintenance q21d        REVIEW OF SYSTEMS:   Review of Systems  Constitutional:  Positive for malaise/fatigue. Negative for chills and fever.  Respiratory:  Positive for shortness of breath.  Negative for hemoptysis.   Cardiovascular: Negative.   Gastrointestinal:  Negative for nausea and vomiting.       Reports intermittent abdominal discomfort  Skin: Negative.   Neurological: Negative.   Endo/Heme/Allergies:  Does not bruise/bleed easily.   I have reviewed the past medical history, past surgical history, social history and family history with the patient and they are unchanged from previous note.   PHYSICAL EXAMINATION: ECOG PERFORMANCE STATUS: 3 - Symptomatic, >50% confined to bed  Vitals:   07/25/21 0915 07/25/21 0930  BP: (!) 115/47 140/62  Pulse: (!) 131 (!) 122  Resp: 20 18  Temp:  98.2 F (36.8 C)  SpO2: 95% 96%   There were no vitals filed for this visit.  Intake/Output from previous day: 01/12 0701 - 01/13 0700 In: 912.5 [IV Piggyback:912.5] Out: 600 [Urine:600]  Physical Exam Constitutional:      Comments: Somnolent, but able to answer questions.  Cachectic.  HENT:     Head: Normocephalic.  Eyes:     Comments: Scleral icterus present  Cardiovascular:     Rate and Rhythm: Tachycardia present.  Pulmonary:  Effort: Pulmonary effort is normal.     Breath sounds: Normal breath sounds.  Abdominal:     Tenderness: There is no abdominal tenderness.     Comments: Ascites present  Musculoskeletal:     Right lower leg: No edema.     Left lower leg: No edema.  Skin:    General: Skin is warm and dry.  Neurological:     Comments: Somnolent but arousable    LABORATORY DATA:  I have reviewed the data as listed CMP Latest Ref Rng & Units 07/25/2021 07/24/2021 07/24/2021  Glucose 70 - 99 mg/dL 79 84 92  BUN 8 - 23 mg/dL 36(H) 35(H) 38(H)  Creatinine 0.44 - 1.00 mg/dL 0.78 0.82 0.93  Sodium 135 - 145 mmol/L 138 140 139  Potassium 3.5 - 5.1 mmol/L 4.0 4.2 4.2  Chloride 98 - 111 mmol/L 113(H) 113(H) 115(H)  CO2 22 - 32 mmol/L 16(L) 17(L) 18(L)  Calcium 8.9 - 10.3 mg/dL 7.6(L) 7.9(L) 7.9(L)  Total Protein 6.5 - 8.1 g/dL 4.8(L) 5.4(L) 5.9(L)  Total  Bilirubin 0.3 - 1.2 mg/dL 13.3(H) 12.5(H) 16.1(H)  Alkaline Phos 38 - 126 U/L 451(H) 458(H) 630(H)  AST 15 - 41 U/L 103(H) 110(H) 147(H)  ALT 0 - 44 U/L 60(H) 62(H) 83(H)    Lab Results  Component Value Date   WBC 1.1 (LL) 07/25/2021   HGB 6.4 (LL) 07/25/2021   HCT 18.5 (L) 07/25/2021   MCV 88.9 07/25/2021   PLT 22 (LL) 07/25/2021   NEUTROABS 0.4 (LL) 07/25/2021    No results found for: CEA1, CEA, EGB151, CA125, PSA1  CT Angio Chest PE W and/or Wo Contrast  Result Date: 07/24/2021 CLINICAL DATA:  Shortness of breath, receiving chemotherapy for liver carcinoma. EXAM: CT ANGIOGRAPHY CHEST WITH CONTRAST TECHNIQUE: Multidetector CT imaging of the chest was performed using the standard protocol during bolus administration of intravenous contrast. Multiplanar CT image reconstructions and MIPs were obtained to evaluate the vascular anatomy. RADIATION DOSE REDUCTION: This exam was performed according to the departmental dose-optimization program which includes automated exposure control, adjustment of the mA and/or kV according to patient size and/or use of iterative reconstruction technique. CONTRAST:  47mL OMNIPAQUE IOHEXOL 350 MG/ML SOLN COMPARISON:  None. FINDINGS: Cardiovascular: Satisfactory opacification of the pulmonary arteries to the segmental level. No evidence of pulmonary embolism. Normal heart size. Coronary artery atherosclerotic calcifications. No pericardial effusion. Mediastinum/Nodes: Right hilar region masslike opacity which may represent lymph nodes or mass. Lungs/Pleura: Emphysematous changes of bilateral lung apices. Large right upper lobe consolidation which extends into the right hilar region and is difficult to differentiate between the lung consolidation/right hilar mass/lymph nodes. There are ground-glass opacities and fibrotic changes in the right upper lobe. Small right pleural effusion. Right lower lobe atelectasis with adjacent ground-glass opacities concerning for  infectious/inflammatory process. Upper Abdomen: No acute abnormality. Enlarged liver. Evaluation of hepatic parenchyma is limited on this pulmonary angiogram. Musculoskeletal: Heterogeneous marrow density of the spine. No acute osseous abnormality. Review of the MIP images confirms the above findings. IMPRESSION: 1.  No evidence of pulmonary embolism. 2. Large right upper lobe consolidation which extends into the right hilar region. It may represent acute pneumonia with hilar lymphadenopathy, however a lung mass with metastatic lymph nodes can not be excluded. Short-term follow-up examination is recommended. 3. Small right pleural effusion with right basilar atelectasis with adjacent ground-glass opacities concerning for infectious/inflammatory process. 4.  Emphysematous changes of bilateral lungs. 5. Heterogeneous bone density of the thoracic spine without evidence of acute osseous  abnormality. Electronically Signed   By: Keane Police D.O.   On: 07/24/2021 16:23   US Abdomen Complete  Result Date: 07/25/2021 CLINICAL DATA:  Transaminitis. 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 EXAM: ABDOMEN ULTRASOUND COMPLETE COMPARISON:  None. FINDINGS: Gallbladder: There is diffuse heterogeneity of the gallbladder lumen with hyperechoic and anechoic regions and partial shadowing. This may be secondary to diffuse gallstones. The gallbladder wall thickness measures 3 mm, at the upper limits of normal. Common bile duct: Diameter: 4 mm, within normal limits Liver: Mildly nodular liver contour. Numerous hypoechoic nodular densities are seen throughout the liver consistent with previously reported metastatic disease. Within the right hepatic lobe there is a peripheral subcapsular anechoic avascular 2.1 x 1.2 x 2.0 cm cyst. At the periphery of this cyst is a tiny echogenic nodule measuring up to 6 mm that may represent one of the innumerable parenchymal nodules seen throughout the liver. Portal vein  is patent on color Doppler imaging with normal direction of blood flow towards the liver. IVC: No abnormality visualized. Pancreas: Visualized portion unremarkable. Spleen: Size and appearance within normal limits. Right Kidney: Length: 9.7 cm. Echogenicity within normal limits. No mass or hydronephrosis visualized. Left Kidney: Length: 9.3 cm. Echogenicity within normal limits. No mass or hydronephrosis visualized. Abdominal aorta: No aneurysm visualized. The aortic bifurcation was not visualized. Other findings: Small left pleural effusion. Mild ascites around the liver and spleen. IMPRESSION:: IMPRESSION: 1. There are numerable nodules seen throughout the liver consistent with the diffuse metastases seen on prior CT and PET-CT. Within the posterior aspect of the right hepatic lobe there is a lobular cystic lesion that is similar to 07/01/2021 prior CT. 2. There is diffuse heterogeneity of the gallbladder lumen. No gallbladder wall thickening. This may represent numerous gallstones. Gallbladder malignancy is felt less likely. 3. Mild ascites.  Small left pleural effusion. Electronically Signed   By: Yvonne Kendall M.D.   On: 07/25/2021 09:16   CT Abdomen Pelvis W Contrast  Result Date: 07/01/2021 CLINICAL DATA:  Abdominal pain EXAM: CT ABDOMEN AND PELVIS WITH CONTRAST TECHNIQUE: Multidetector CT imaging of the abdomen and pelvis was performed using the standard protocol following bolus administration of intravenous contrast. CONTRAST:  31mL OMNIPAQUE IOHEXOL 350 MG/ML SOLN COMPARISON:  02/28/2021 PET-CT FINDINGS: Lower chest: Lung bases are free of acute infiltrate or sizable effusion. There is a 3-4 mm nodule identified in the medial aspect of the right lung base best seen on image number 24 of series 4. This was not present on the recent PET-CT from 02/28/2021. Hepatobiliary: Liver is well visualized with a few small cysts identified within the left lobe of the liver and inferiorly in the right lobe of the  liver stable in appearance from the prior CT. There are however new peripherally enhancing lesions scattered throughout the liver dominant lesion in the left lobe measures 2.6 cm best seen on image number 18 of series 2. Dominant lesion on the right measures approximately 2 cm best seen on image number 32 of series 2. 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 Pancreas: Unremarkable. No pancreatic ductal dilatation or surrounding inflammatory changes. Spleen: Normal in size without focal abnormality. Adrenals/Urinary Tract: Adrenal glands are unremarkable. Kidneys demonstrate a normal enhancement pattern bilaterally. No renal calculi or obstructive changes are seen. Normal excretion of contrast is noted bilaterally. The bladder is well distended. Stomach/Bowel: Colon shows no obstructive  or inflammatory changes. There are findings of prior resection of the right colon and proximal transverse colon seen. No small bowel obstructive changes are noted. The stomach is within normal limits. Vascular/Lymphatic: Diffuse vascular calcifications are seen. Lymphadenopathy is noted in the porta hepatis which was not well appreciated on recent PET-CT measuring up to 17 mm in short axis. No other definitive lymphadenopathy is seen. Reproductive: Uterus and bilateral adnexa are unremarkable. Other: No abdominal wall hernia or abnormality. No abdominopelvic ascites. Musculoskeletal: No acute or significant osseous findings. IMPRESSION: Multiple too numerous to count peripherally enhancing lesions within the liver consistent with metastatic disease. Associated porta hepatis lymph nodes are noted as well. 4 mm nodule in the right lung base not seen on the prior PET-CT examination and given the findings in the liver is somewhat suspicious for metastatic disease. Changes of prior right colectomy.  Electronically Signed   By: Inez Catalina M.D.   On: 07/01/2021 15:42   DG Chest Port 1 View  Result Date: 07/24/2021 CLINICAL DATA:  History of metastatic neuroendocrine tumor EXAM: PORTABLE CHEST 1 VIEW COMPARISON:  PET-CT 02/28/2021 FINDINGS: Heart size is normal. Abnormal soft tissue prominence of the right hilum and right paratracheal stripe compatible with known metastatic disease. New airspace consolidation within the inferior aspect of the right upper lobe extending to the hilum which may represent atelectasis or infiltrate. Trace right pleural effusion. No pneumothorax. IMPRESSION: New airspace consolidation within the inferior aspect of the right upper lobe extending to the hilum which may represent atelectasis or infiltrate. Electronically Signed   By: Davina Poke D.O.   On: 07/24/2021 14:54   [place] CORE BIOPSY (LIVER)  Result Date: 07/11/2021 Criselda Peaches, MD     07/11/2021  2:52 PM Interventional Radiology Procedure Note Procedure: US guided liver bx Complications: None Estimated Blood Loss: None Recommendations: - Bedrest x 2 hrs - DC home Signed, Criselda Peaches, MD     Future Appointments  Date Time Provider [place]  07/28/2021  1:00 PM [facility] None  07/28/2021  1:20 PM Truitt Merle, MD [facility] None  08/04/2021 10:15 AM [facility] LAB [facility] None  08/04/2021 10:40 AM Truitt Merle, MD [facility] None  08/04/2021 11:30 AM [facility] INFUSION [facility] None  08/05/2021  1:30 PM [facility] INFUSION [facility] None  08/06/2021  2:30 PM [facility] INFUSION [facility] None  08/08/2021  1:30 PM [facility] [place] FLUSH [facility] None  08/11/2021 12:30 PM [facility] 2 [facility]-CT [place]      LOS: 1 day   Addendum  I have seen the patient, examined her. I agree with the assessment and and plan and have edited the notes.   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  She received her first cycle of chemotherapy last week, followed by G-CSF.  She was admitted for sepsis, hypoxic respite failure, and now developed pancytopenia, likely from her recent chemo and infection. She is critically ill, I agree with broad antibiotics, blood transfusion for hemoglobin less than 7.5 and plt <20. Due to her multiple organ failure, her prognosis is extremely poor, she has very high possibility of death during this hospital stay. I spoke with pt and her son today, she is full code now. If she does not turn around and improve by early next week, I will discuss code status again and hospice with pt and her family next week. Please feel free to call  me over the weekend if anything I can help.   Truitt Merle  07/25/2021  Cell [phone]

## 2021-07-25 NOTE — Progress Notes (Addendum)
Oncology Discharge Planning Admission Note  [facility] Address: [address], [place], [place] [address] Hours of Operation:  Nena Polio, Monday - Friday  Clinic Contact Information:  [phone]) [phone]  Oncology Care Team: Medical Oncologist:  Dr. Jolayne Panther  Dr. Phylliss Bob. MD is aware of this hospital admission dated 07/24/21, and the cancer center will follow Inda Coke inpatient care to assist with discharge planning as indicated by the oncologist.  We will reach out closer to discharge date to arrange follow up care.  Disclaimer:  This [place] note does not imply a formal consult request has been made by the admitting attending for this admission or there will be an inpatient consult completed by oncology.  Please request oncology consults as per standard process as indicated.

## 2021-07-25 NOTE — Progress Notes (Signed)
Patient is cussing at primary RN, saying "you're getting fired tonight".  Charge nurse informed.

## 2021-07-25 NOTE — Progress Notes (Signed)
°  Transition of Care [facility]) Screening Note   Patient Details  Name: Meredith Goodman Date of Birth: September 16, 1955   Transition of Care [facility]) CM/SW Contact:    Lennart Pall, LCSW Phone Number: 07/25/2021, 10:00 AM    Transition of Care Department [facility]) has reviewed patient and no TOC needs have been identified at this time. We will continue to monitor patient advancement through interdisciplinary progression rounds. If new patient transition needs arise, please place a TOC consult.
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
## 2021-07-25 NOTE — Progress Notes (Signed)
Initial Nutrition Assessment  DOCUMENTATION CODES:   Severe malnutrition in context of chronic illness  INTERVENTION:  - will order Ensure Enlive TID, each supplement provides 350 kcal and 20 grams of protein. - recommend liberalize diet from 2 gram Na to Regular.    NUTRITION DIAGNOSIS:   Severe Malnutrition related to chronic illness, cancer and cancer related treatments as evidenced by severe fat depletion, severe muscle depletion.  GOAL:   Patient will meet greater than or equal to 90% of their needs  MONITOR:   PO intake, Supplement acceptance, Labs, Weight trends  REASON FOR ASSESSMENT:   Malnutrition Screening Tool  ASSESSMENT:   66 y.o. female with medical history of malignant carcinoid of the ileum, small cell lung cancer, recent dx of liver metastasis, undergoing chemo, DM, hypercholesteremia, hemorrhoids, vitamin D deficiency, arthritis, GERD, pre-diabetes vs DM, and essential HTN. She presented to the ED due to sudden onset of shortness of breath x1 day. She presented to the ED via EMS from home.  Diet advanced from NPO to 2 gram Na today at 1200. RN was ordering patient's lunch tray when RD arrived.   Patient laying in bed with her son and sister at bedside. Patient provides the majority of information but her son also provides some information.   Patient recalls seeing RD at the [facility] on [facility]/29/21 and receiving a case of Ensure on 07/03/21.  She shares that she has gained a few pounds over the past 2 months and that she is drinking Ensure 2-3 times/day. She reports good appetite overall. She enjoys things such as potatoes and chicken, which she ate the day PTA. She is feeling very hungry today and looking forward to lunch meal.  She does experience BLE edema at home. Alerted RN that patient and family have questions about how to treat this.   Last documented weight was 107 lb on 07/21/21 and weight prior to that was stable (100-104 lb) from 37/22-07/17/21.     Labs reviewed; Cl: 113 mmol/l, BUN: 36 mg/dl, Ca: 7.6 mg/dl, Alk Phos elevated but slightly down from 1/12, LFT elevated but slightly down from 1/12, ammonia: 94 umol/l.  Medications reviewed; 20 mg IV lasix/day, 12.5 g albumin x4 doses, 300 ml rectal dulcolax x1 dose 1/12, 5 mg vitamin K x1 run 1/13.    NUTRITION - FOCUSED PHYSICAL EXAM:  Flowsheet Row Most Recent Value  Orbital Region Moderate depletion  Upper Arm Region Severe depletion  Thoracic and Lumbar Region Severe depletion  Buccal Region Moderate depletion  Temple Region Moderate depletion  Clavicle Bone Region Severe depletion  Clavicle and Acromion Bone Region Severe depletion  Scapular Bone Region Severe depletion  Dorsal Hand Moderate depletion  Patellar Region Moderate depletion  Anterior Thigh Region Severe depletion  Posterior Calf Region Moderate depletion  Edema (RD Assessment) Moderate  [BLE]  Hair Reviewed  Eyes Reviewed  Mouth Reviewed  Skin Reviewed  Nails Unable to assess  [artificial nails]       Diet Order:   Diet Order             Diet 2 gram sodium Room service appropriate? Yes; Fluid consistency: Thin  Diet effective now                   EDUCATION NEEDS:   No education needs have been identified at this time  Skin:  Skin Assessment: Reviewed RN Assessment  Last BM:  PTA/unknown  Height:   Ht Readings from Last 1 Encounters:  07/17/21  _0  (1.575 m)    Weight:   Wt Readings from Last 1 Encounters:  07/21/21 48.6 kg     Estimated Nutritional Needs:  Kcal:  1700-1950 kcal Protein:  85-100 grams Fluid:  >/= 1.6 L/day     Jarome Matin, MS, RD, LDN Inpatient Clinical Dietitian RD pager # available in [place]  After hours/weekend pager # available in [facility]

## 2021-07-25 NOTE — Progress Notes (Signed)
During initial assessment, patient became irritated, verbally aggressive and demanding to leave.

## 2021-07-25 NOTE — Progress Notes (Addendum)
PROGRESS NOTE  Meredith Goodman SFK:[id number] DOB: December 17, 1955 DOA: 07/24/2021 PCP: Pcp, No  HPI/Recap of past 24 hours:  Meredith Goodman is a 66 y.o. female with medical history significant for malignant carcinoid of the ileum, small cell lung cancer, recent diagnosis of liver metastasis, with ongoing chemotherapy, essential hypertension, who presented to [facility] ED due to sudden onset shortness of breath of 1 day duration.  Patient was brought into the ED via EMS from home for further evaluation.  At the time of this visit, the patient is obtunded and minimally interactive.  Her daughter is at bedside and provides a history.  She reports that the patient was doing fine up until yesterday.  She started complaining of shortness of breath with exertion.  EMS noted hypoxia with O2 saturation 90% on room air.  Not on oxygen supplementation at baseline.  Was placed on 4 L O2 [place] with saturation 100%.  She had a CTA done in the ED which ruled out pulmonary embolism however it showed right upper lobe mass versus pneumonia.  TRH, hospitalist service, was asked to admit for further evaluation and management of symptomatology.   07/25/2021: Patient was seen and examined at her bedside.  Her daughter was present in the room.  She is more alert today.  She had a bowel movement after rectal lactulose overnight.  Drop of hemoglobin noted this morning as well as drop in platelet count.  Due to concern for intra-abdominal bleed 1 unit PRBC and 1 unit of platelet was ordered to be transfused.  Assessment/Plan: Principal Problem:   Acute respiratory failure with hypoxia (HCC)  Acute hypoxic respiratory failure secondary to lung mass versus infiltrate seen on CT scan Currently on 4 L with O2 saturation 100% Maintain O2 saturation greater than 90% Wean off oxygen supplementation as tolerated Continue broad IV antibiotics coverage for now Oncology consulted due to possibility of a pulmonary mass   Acute hepatic  encephalopathy Elevated ammonia level on presentation Improved mental status with lactulose Continue lactulose Goal BM 2 to 4/day Paracentesis by IR if possible, diagnostic, to rule out SBP Avoid hepatotoxic agents  Acute blood loss anemia, rule out intra-abdominal bleed Abdominal ultrasound ordered GI consulted Hemoglobin dropped to 6.0, transfuse 1 unit Repeat CBC after transfusion Goal hemoglobin 7-9 Platelet count 20,000, transfuse 1 unit of platelet goal platelet count of 50,000 if bleeding   Sepsis, suspect intra-abdominal source Leukopenia 1.5, tachycardia 124 IR paracentesis with fluid analysis, follow results Follow blood cultures and urine culture Received IV fluid in the ED Started on IV cefepime and IV vancomycin in the ED  MRSA screening test negative, IV vancomycin DC'd on 07/25/2018. Monitor fever curve and WBC Maintain MAP greater than 65   Abdominal ascites in the setting of recently diagnosed mets to the liver IR consulted for paracentesis diagnostic and therapeutic Rule out SBP   Elevated liver chemistries/jaundice in the setting of liver metastasis T bili 16, alkaline phosphatase, AST ALT elevated Trend LFTs Avoid hepatotoxic agents Lactulose IV albumin Avoid sedative agents Consult GI in the morning   Coagulopathy in the setting of advanced liver disease INR 2.1 Hold off pharmacological DVT prophylaxis due to increased risk of bleeding Repeat PT/INR in the morning   Leukopenia with recent chemotherapy IV antibiotics empirically with broad coverage Repeat CBC with differential in the morning Consult oncology in the morning   Pancytopenia likely secondary to recent chemotherapy All 3 cell lines are down Consult oncology in the morning Repeat CBC with differentials in  the morning  Chronic pain syndrome Minimize use of narcotics and sedating agents.  Severe protein calorie malnutrition in the setting of advanced liver disease Dietitian  consult   Critical care time: 65 minutes     DVT prophylaxis: SCDs due to high risk of bleeding   Code Status: Full code per her daughter at bedside   Family Communication: Daughter at bedside   Disposition Plan: Admitted to stepdown unit   Consults called: Consult oncology and GI in the morning   Admission status: Inpatient status     Status is: Inpatient   Patient requires at least 2 midnights for further evaluation and treatment of present condition.                Objective: Vitals:   07/25/21 0930 07/25/21 1129 07/25/21 1205 07/25/21 1309  BP: 140/62 (!) 159/92 (!) 168/91 (!) 132/91  Pulse: (!) 122 (!) 122 (!) 126 (!) 126  Resp: 18 (!) 33 (!) 31 (!) 29  Temp: 98.2 F (36.8 C) 98.8 F (37.1 C) 98.4 F (36.9 C) 98.3 F (36.8 C)  TempSrc: Axillary Oral Oral Axillary  SpO2: 96% 95% 98% 96%    Intake/Output Summary (Last 24 hours) at 07/25/2021 1632 Last data filed at 07/25/2021 1548 Gross per 24 hour  Intake 789.65 ml  Output 1700 ml  Net -910.35 ml   There were no vitals filed for this visit.  Exam:  General: 66 y.o. year-old female frail-appearing in no acute distress.  Alert and oriented x3. Cardiovascular: Regular rate and rhythm with no rubs or gallops.  No thyromegaly or JVD noted.   Respiratory: Mild rales at bases with no wheezing noted.  Poor inspiratory effort. Abdomen: Distended with bowel sounds present. Musculoskeletal: 2+ pedal edema lower extremities bilaterally.   Skin: No ulcerative lesions noted or rashes, Psychiatry: Mood is appropriate for condition and setting   Data Reviewed: CBC: Recent Labs  Lab 07/21/21 1001 07/24/21 1412 07/24/21 2227 07/25/21 0253 07/25/21 0713 07/25/21 1438  WBC 6.3 1.5* 1.3* 1.3* 1.1*  --   NEUTROABS 5.5 0.6* 0.4* 0.4*  --   --   HGB 9.3* 8.7* 10.5* 7.1* 6.4* 9.3*  HCT 25.6* 24.7* 29.3* 20.2* 18.5* 25.8*  MCV 85.6 86.7 85.9 87.8 88.9  --   PLT 77* 32* 25* 23* 22*  --    Basic Metabolic  Panel: Recent Labs  Lab 07/21/21 1001 07/24/21 1412 07/24/21 2227 07/25/21 0253  NA 141 139 140 138  K 4.7 4.2 4.2 4.0  CL 112* 115* 113* 113*  CO2 19* 18* 17* 16*  GLUCOSE 91 92 84 79  BUN 60* 38* 35* 36*  CREATININE 1.40* 0.93 0.82 0.78  CALCIUM 7.9* 7.9* 7.9* 7.6*  MG  --   --   --  2.0  PHOS  --   --   --  4.2   GFR: Estimated Creatinine Clearance: 54.5 mL/min (by C-G formula based on SCr of 0.78 mg/dL). Liver Function Tests: Recent Labs  Lab 07/21/21 1001 07/24/21 1412 07/24/21 2227 07/25/21 0253  AST 165* 147* 110* 103*  ALT 88* 83* 62* 60*  ALKPHOS 550* 630* 458* 451*  BILITOT 14.3* 16.1* 12.5* 13.3*  PROT 5.3* 5.9* 5.4* 4.8*  ALBUMIN 2.8* 2.3* 2.5* 2.0*   No results for input(s): LIPASE, AMYLASE in the last 168 hours. Recent Labs  Lab 07/24/21 1412 07/25/21 0713  AMMONIA 57* 94*   Coagulation Profile: Recent Labs  Lab 07/24/21 1412 07/25/21 0253  INR 2.1* 2.4*  Cardiac Enzymes: No results for input(s): CKTOTAL, CKMB, CKMBINDEX, TROPONINI in the last 168 hours. BNP (last 3 results) No results for input(s): PROBNP in the last 8760 hours. HbA1C: No results for input(s): HGBA1C in the last 72 hours. CBG: No results for input(s): GLUCAP in the last 168 hours. Lipid Profile: No results for input(s): CHOL, HDL, LDLCALC, TRIG, CHOLHDL, LDLDIRECT in the last 72 hours. Thyroid Function Tests: No results for input(s): TSH, T4TOTAL, FREET4, T3FREE, THYROIDAB in the last 72 hours. Anemia Panel: No results for input(s): VITAMINB12, FOLATE, FERRITIN, TIBC, IRON, RETICCTPCT in the last 72 hours. Urine analysis:    Component Value Date/Time   COLORURINE YELLOW 07/01/2021 [place] 07/01/2021 1613   LABSPEC 1.014 07/01/2021 1613   PHURINE 7.0 07/01/2021 1613   GLUCOSEU NEGATIVE 07/01/2021 1613   HGBUR SMALL (A) 07/01/2021 1613   BILIRUBINUR NEGATIVE 07/01/2021 1613   KETONESUR NEGATIVE 07/01/2021 1613   PROTEINUR NEGATIVE 07/01/2021 1613    UROBILINOGEN 0.2 12/26/2016 1259   NITRITE NEGATIVE 07/01/2021 1613   LEUKOCYTESUR NEGATIVE 07/01/2021 1613   Sepsis Labs: @LABRCNTIP (procalcitonin:4,lacticidven:4)  ) Recent Results (from the past 240 hour(s))  Blood Culture (routine x 2)     Status: None (Preliminary result)   Collection Time: 07/24/21  2:20 PM   Specimen: BLOOD  Result Value Ref Range Status   Specimen Description   Final    BLOOD BLOOD LEFT FOREARM Performed at [facility], [place] [address] [place], [place] [address]    Special Requests   Final    BOTTLES DRAWN AEROBIC AND ANAEROBIC Blood Culture results may not be optimal due to an inadequate volume of blood received in culture bottles Performed at [place] [address], [place], [place] [address]    Culture   Final    NO GROWTH < 24 HOURS Performed at [place] [facility] [address] [place], [place] [address]    Report Status PENDING  Incomplete  Blood Culture (routine x 2)     Status: None (Preliminary result)   Collection Time: 07/24/21  2:25 PM   Specimen: BLOOD  Result Value Ref Range Status   Specimen Description   Final    BLOOD BLOOD RIGHT FOREARM Performed at [facility] [address], [place], [place] [address]    Special Requests   Final    BOTTLES DRAWN AEROBIC AND ANAEROBIC Blood Culture results may not be optimal due to an inadequate volume of blood received in culture bottles Performed at [place] [address] [place], [place] [address]    Culture   Final    NO GROWTH < 24 HOURS Performed at [facility] [address], [place],  [address]    Report Status PENDING  Incomplete  Resp Panel by RT-PCR (Flu A&B, Covid) Nasopharyngeal Swab     Status: None   Collection Time: 07/24/21  2:36 PM   Specimen: Nasopharyngeal Swab; Nasopharyngeal(NP) swabs in vial transport medium  Result Value Ref Range Status   SARS Coronavirus 2  by RT PCR NEGATIVE NEGATIVE Final    Comment: (NOTE) SARS-CoV-2 target nucleic acids are NOT DETECTED.  The SARS-CoV-2 RNA is generally detectable in upper respiratory specimens during the acute phase of infection. The lowest concentration of SARS-CoV-2 viral copies this assay can detect is 138 copies/mL. A negative result does not preclude SARS-Cov-2 infection and should not be used as the sole basis for treatment or other patient  management decisions. A negative result may occur with  improper specimen collection/handling, submission of specimen other than nasopharyngeal swab, presence of viral mutation(s) within the areas targeted by this assay, and inadequate number of viral copies(<138 copies/mL). A negative result must be combined with clinical observations, patient history, and epidemiological information. The expected result is Negative.  Fact Sheet for Patients:  EntrepreneurPulse.com.au  Fact Sheet for Healthcare Providers:  IncredibleEmployment.be  This test is no t yet approved or cleared by the [place] FDA and  has been authorized for detection and/or diagnosis of SARS-CoV-2 by FDA under an Emergency Use Authorization (EUA). This EUA will remain  in effect (meaning this test can be used) for the duration of the COVID-19 declaration under Section 564(b)(1) of the Act, 21 U.S.C.section 360bbb-3(b)(1), unless the authorization is terminated  or revoked sooner.       Influenza A by PCR NEGATIVE NEGATIVE Final   Influenza B by PCR NEGATIVE NEGATIVE Final    Comment: (NOTE) The Xpert Xpress SARS-CoV-2/FLU/RSV plus assay is intended as an aid in the diagnosis of influenza from Nasopharyngeal swab specimens and should not be used as a sole basis for treatment. Nasal washings and aspirates are unacceptable for Xpert Xpress SARS-CoV-2/FLU/RSV testing.  Fact Sheet for Patients: EntrepreneurPulse.com.au  Fact  Sheet for Healthcare Providers: IncredibleEmployment.be  This test is not yet approved or cleared by the [place] FDA and has been authorized for detection and/or diagnosis of SARS-CoV-2 by FDA under an Emergency Use Authorization (EUA). This EUA will remain in effect (meaning this test can be used) for the duration of the COVID-19 declaration under Section 564(b)(1) of the Act, 21 U.S.C. section 360bbb-3(b)(1), unless the authorization is terminated or revoked.  Performed at [facility], [place] [address], [place], [place] [address]   MRSA Next Gen by PCR, Nasal     Status: None   Collection Time: 07/24/21  6:56 PM   Specimen: Nasal Mucosa; Nasal Swab  Result Value Ref Range Status   MRSA by PCR Next Gen NOT DETECTED NOT DETECTED Final    Comment: (NOTE) The GeneXpert MRSA Assay (FDA approved for NASAL specimens only), is one component of a comprehensive MRSA colonization surveillance program. It is not intended to diagnose MRSA infection nor to guide or monitor treatment for MRSA infections. Test performance is not FDA approved in patients less than 68 years old. Performed at [facility], [place] [address] [place], [place] [address]       Studies: US Abdomen Complete  Result Date: 07/25/2021 CLINICAL DATA:  Transaminitis. 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 EXAM: ABDOMEN ULTRASOUND COMPLETE COMPARISON:  None. FINDINGS: Gallbladder: There is diffuse heterogeneity of the gallbladder lumen with hyperechoic and anechoic regions and partial shadowing. This may be secondary to diffuse gallstones. The gallbladder wall thickness measures 3 mm, at the upper limits of normal. Common bile duct: Diameter: 4 mm, within normal limits Liver: Mildly nodular liver contour. Numerous hypoechoic nodular densities are seen throughout the liver consistent with previously reported metastatic disease. Within the  right hepatic lobe there is a peripheral subcapsular anechoic avascular 2.1 x 1.2 x 2.0 cm cyst. At the periphery of this cyst is a tiny echogenic nodule measuring up to 6 mm that may represent one of the innumerable parenchymal nodules seen throughout the liver. Portal vein is patent on color Doppler imaging with normal direction of blood flow towards the liver. IVC: No abnormality visualized. Pancreas: Visualized portion unremarkable. Spleen: Size  and appearance within normal limits. Right Kidney: Length: 9.7 cm. Echogenicity within normal limits. No mass or hydronephrosis visualized. Left Kidney: Length: 9.3 cm. Echogenicity within normal limits. No mass or hydronephrosis visualized. Abdominal aorta: No aneurysm visualized. The aortic bifurcation was not visualized. Other findings: Small left pleural effusion. Mild ascites around the liver and spleen. IMPRESSION:: IMPRESSION: 1. There are numerable nodules seen throughout the liver consistent with the diffuse metastases seen on prior CT and PET-CT. Within the posterior aspect of the right hepatic lobe there is a lobular cystic lesion that is similar to 07/01/2021 prior CT. 2. There is diffuse heterogeneity of the gallbladder lumen. No gallbladder wall thickening. This may represent numerous gallstones. Gallbladder malignancy is felt less likely. 3. Mild ascites.  Small left pleural effusion. Electronically Signed   By: Yvonne Kendall M.D.   On: 07/25/2021 09:16   [place] ASCITES (ABDOMEN LIMITED)  Result Date: 07/25/2021 CLINICAL DATA:  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 COMPARISON:  07/01/2021 FINDINGS: Survey of the abdominal 4 quadrants demonstrates a very small amount of abdominopelvic ascites. There is not enough fluid to warrant therapeutic paracentesis. Procedure not performed. IMPRESSION:  Trace abdominopelvic ascites. Electronically Signed   By: Jerilynn Mages.  Shick M.D.   On: 07/25/2021 15:03    Scheduled Meds:  atenolol  12.5 mg Oral Daily   Chlorhexidine Gluconate Cloth  6 each Topical Daily   feeding supplement  237 mL Oral TID BM   furosemide  20 mg Intravenous Daily   lactulose  20 g Oral BID   mouth rinse  15 mL Mouth Rinse BID    Continuous Infusions:  piperacillin-tazobactam (ZOSYN)  IV 3.375 g (07/25/21 1415)     LOS: 1 day     Kayleen Memos, MD [facility] Pager [phone]  If 7PM-7AM, please contact night-coverage [email or web address] Password [facility] 07/25/2021, 4:32 PM

## 2021-07-25 NOTE — Progress Notes (Signed)
Could not complete 1 hour follow up ciwa, pt is resting. To promote rest and decrease anxiety, CIWA will be reassessed when patient is awake.

## 2021-07-26 ENCOUNTER — Inpatient Hospital Stay (HOSPITAL_COMMUNITY): Payer: Commercial Managed Care - HMO

## 2021-07-26 DIAGNOSIS — K7201 Acute and subacute hepatic failure with coma: Secondary | ICD-10-CM | POA: Diagnosis not present

## 2021-07-26 DIAGNOSIS — G9340 Encephalopathy, unspecified: Secondary | ICD-10-CM | POA: Diagnosis not present

## 2021-07-26 DIAGNOSIS — C787 Secondary malignant neoplasm of liver and intrahepatic bile duct: Secondary | ICD-10-CM | POA: Diagnosis not present

## 2021-07-26 DIAGNOSIS — Z515 Encounter for palliative care: Secondary | ICD-10-CM | POA: Diagnosis not present

## 2021-07-26 DIAGNOSIS — R188 Other ascites: Secondary | ICD-10-CM | POA: Diagnosis not present

## 2021-07-26 DIAGNOSIS — J9601 Acute respiratory failure with hypoxia: Secondary | ICD-10-CM | POA: Diagnosis not present

## 2021-07-26 LAB — CBC WITH DIFFERENTIAL/PLATELET
Band Neutrophils: 1 %
Basophils Absolute: 0 10*3/uL (ref 0.0–0.1)
Basophils Relative: 1 %
Eosinophils Absolute: 0 10*3/uL (ref 0.0–0.5)
Eosinophils Relative: 1 %
HCT: 28.9 % — ABNORMAL LOW (ref 36.0–46.0)
Hemoglobin: 10.6 g/dL — ABNORMAL LOW (ref 12.0–15.0)
Lymphocytes Relative: 69 %
Lymphs Abs: 1 10*3/uL (ref 0.7–4.0)
MCH: 30.5 pg (ref 26.0–34.0)
MCHC: 36.7 g/dL — ABNORMAL HIGH (ref 30.0–36.0)
MCV: 83.3 fL (ref 80.0–100.0)
Monocytes Absolute: 0.1 10*3/uL (ref 0.1–1.0)
Monocytes Relative: 6 %
Neutro Abs: 0.3 10*3/uL — CL (ref 1.7–7.7)
Neutrophils Relative %: 22 %
Platelets: 50 10*3/uL — ABNORMAL LOW (ref 150–400)
RBC: 3.47 MIL/uL — ABNORMAL LOW (ref 3.87–5.11)
RDW: 19.1 % — ABNORMAL HIGH (ref 11.5–15.5)
WBC: 1.5 10*3/uL — ABNORMAL LOW (ref 4.0–10.5)
nRBC: 2.1 % — ABNORMAL HIGH (ref 0.0–0.2)
nRBC: NONE SEEN /100 WBC

## 2021-07-26 LAB — COMPREHENSIVE METABOLIC PANEL
ALT: 59 U/L — ABNORMAL HIGH (ref 0–44)
AST: 89 U/L — ABNORMAL HIGH (ref 15–41)
Albumin: 2.5 g/dL — ABNORMAL LOW (ref 3.5–5.0)
Alkaline Phosphatase: 421 U/L — ABNORMAL HIGH (ref 38–126)
Anion gap: 9 (ref 5–15)
BUN: 33 mg/dL — ABNORMAL HIGH (ref 8–23)
CO2: 19 mmol/L — ABNORMAL LOW (ref 22–32)
Calcium: 8.3 mg/dL — ABNORMAL LOW (ref 8.9–10.3)
Chloride: 113 mmol/L — ABNORMAL HIGH (ref 98–111)
Creatinine, Ser: 0.84 mg/dL (ref 0.44–1.00)
GFR, Estimated: >60 mL/min (ref >60)
Glucose, Bld: 136 mg/dL — ABNORMAL HIGH (ref 70–99)
Potassium: 3.7 mmol/L (ref 3.5–5.1)
Sodium: 141 mmol/L (ref 135–145)
Total Bilirubin: 14.2 mg/dL — ABNORMAL HIGH (ref 0.3–1.2)
Total Protein: 5.5 g/dL — ABNORMAL LOW (ref 6.5–8.1)

## 2021-07-26 LAB — MAGNESIUM: Magnesium: 1.7 mg/dL (ref 1.7–2.4)

## 2021-07-26 LAB — URINE CULTURE

## 2021-07-26 LAB — PHOSPHORUS: Phosphorus: 3.2 mg/dL (ref 2.5–4.6)

## 2021-07-26 LAB — PROTIME-INR
INR: 1.4 — ABNORMAL HIGH (ref 0.8–1.2)
Prothrombin Time: 17.5 seconds — ABNORMAL HIGH (ref 11.4–15.2)

## 2021-07-26 MED ORDER — FENTANYL CITRATE (PF) 100 MCG/2ML IJ SOLN
25.0000 ug | INTRAMUSCULAR | Status: DC | PRN
  Administered 2021-07-28 (×2): 100 ug via INTRAVENOUS
  Administered 2021-07-28: 50 ug via INTRAVENOUS
  Administered 2021-07-28 – 2021-07-29 (×4): 100 ug via INTRAVENOUS
  Administered 2021-07-30: 50 ug via INTRAVENOUS
  Administered 2021-07-30: 100 ug via INTRAVENOUS
  Filled 2021-07-26 (×9): qty 2

## 2021-07-26 MED ORDER — MAGNESIUM SULFATE 2 GM/50ML IV SOLN
2.0000 g | Freq: Once | INTRAVENOUS | Status: AC
  Administered 2021-07-26: 2 g via INTRAVENOUS
  Filled 2021-07-26: qty 50

## 2021-07-26 MED ORDER — DEXTROSE-NACL 5-0.45 % IV SOLN: INTRAVENOUS | Status: DC

## 2021-07-26 MED ORDER — ORAL CARE MOUTH RINSE
15.0000 mL | Freq: Two times a day (BID) | OROMUCOSAL | Status: DC
  Administered 2021-07-26 – 2021-07-30 (×8): 15 mL via OROMUCOSAL

## 2021-07-26 MED ORDER — LACTULOSE 10 GM/15ML PO SOLN
30.0000 g | Freq: Two times a day (BID) | ORAL | Status: DC
Start: 2021-07-26 — End: 2021-07-26

## 2021-07-26 MED ORDER — LACTULOSE 10 GM/15ML PO SOLN
30.0000 g | Freq: Two times a day (BID) | ORAL | Status: DC
  Administered 2021-07-27 – 2021-07-29 (×7): 30 g
  Filled 2021-07-26 (×7): qty 45

## 2021-07-26 MED ORDER — DEXMEDETOMIDINE HCL IN NACL 200 MCG/50ML IV SOLN
0.4000 ug/kg/h | INTRAVENOUS | Status: AC
  Administered 2021-07-26: 16:00:00 0.4 ug/kg/h via INTRAVENOUS
  Administered 2021-07-26: 0.6 ug/kg/h via INTRAVENOUS
  Administered 2021-07-27: 10:00:00 0.2 ug/kg/h via INTRAVENOUS
  Administered 2021-07-27: 0.6 ug/kg/h via INTRAVENOUS
  Administered 2021-07-28 (×2): 0.4 ug/kg/h via INTRAVENOUS
  Administered 2021-07-28: 0.7 ug/kg/h via INTRAVENOUS
  Administered 2021-07-29: 0.6 ug/kg/h via INTRAVENOUS
  Filled 2021-07-26: qty 50
  Filled 2021-07-26: qty 100
  Filled 2021-07-26: qty 50
  Filled 2021-07-26: qty 100
  Filled 2021-07-26 (×3): qty 50

## 2021-07-26 MED ORDER — PANTOPRAZOLE SODIUM 40 MG IV SOLR
40.0000 mg | INTRAVENOUS | Status: DC
  Administered 2021-07-26 – 2021-07-27 (×2): 40 mg via INTRAVENOUS
  Filled 2021-07-26 (×2): qty 40

## 2021-07-26 MED ORDER — ATENOLOL 25 MG PO TABS: 25.0000 mg | ORAL_TABLET | Freq: Two times a day (BID) | ORAL | Status: DC

## 2021-07-26 MED ORDER — LACTULOSE 10 GM/15ML PO SOLN: 30.0000 g | Freq: Two times a day (BID) | ORAL | Status: DC

## 2021-07-26 MED ORDER — SODIUM CHLORIDE 0.9 % IV SOLN: INTRAVENOUS | Status: DC | PRN

## 2021-07-26 MED ORDER — AMLODIPINE BESYLATE 5 MG PO TABS
5.0000 mg | ORAL_TABLET | Freq: Every day | ORAL | Status: DC
  Administered 2021-07-26: 5 mg via ORAL
  Filled 2021-07-26: qty 1

## 2021-07-26 NOTE — Progress Notes (Signed)
When questioned if the white substance which looks like a rock belongs to her she said yes.

## 2021-07-26 NOTE — Progress Notes (Signed)
Patient was found half way out of the bed, nasal canula wrapped around her, incontinent of urine.   She was assisted back in bed, and cleaned up.

## 2021-07-26 NOTE — Progress Notes (Signed)
Patient was cleaned after an incontinent episode.   While cleaning the room a bottle of medication was found close by the sink hidden within a diaper. On inspection the bottle label as "Tizanidine" revealed a suspicious looking white substance. With a rock like consistency.   Charge called, Financial risk analyst called Security at bedside.   Suspicious substance was confiscated by security.   VSS, will continue to monitor.

## 2021-07-26 NOTE — Progress Notes (Signed)
During rounds provider from palliative consult on patient. Nurse informed provider of the agitation and orders needed for shift. Provider placed an order for a foley cath and asked nurse to postpone CT scan from oncology. Foley cath was placed on patient. Will continue to monitor.

## 2021-07-26 NOTE — Progress Notes (Signed)
Due to agitation delirium, pulling at her IV accesses and telemetry lines, patient was started on Precedex drip.  Added soft restraints for patient's own safety.    PCCM consulted to assist with the management of agitation delirium.  Highly appreciate assistance.  Patient on oral lactulose for goal BM 2-4 per day due to hepatic encephalopathy, but unable to take NPO at this time.  Lactulose will be administered via NG tube.  No overt bleeding.  Last platelet count 50 K and last INR 1.4.

## 2021-07-26 NOTE — Consult Note (Signed)
NAME:  Meredith Goodman, MRN:  [id number], DOB:  12/08/1955, LOS: 2 ADMISSION DATE:  07/24/2021, CONSULTATION DATE: 2!376 REFERRING MD:  Dr Irene Pap of [facility], CHIEF COMPLAINT:  Acute Encephalopathy - agitated   History of Present Illness:    66 year old frail cachectic malnourished female who is an smoker, alcoholic and does crack cocaine [UDS this admission positive for opioids and cocaine], along with prescription for oxycodone and MS Contin for chronic cancer-related pain with declining performance status most recently ECOG 3 on 07/21/21 oncology visit.  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 March 2019.  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 She completed palliative chemo cycle 1 days 1-3 on 1/5 - 1/7.  Carboplatin on day 1, etoposide on days 1-3 with GCSF on day 5, and atezolizumab q3 weeks.  Most recent oncology visit on 07/21/2021 she was found to be in the wheelchair, ongoing drinking, ongoing substance abuse nausea and vomiting and jaundice, loss of prescription opioids, cough and dyspnea.  Outpatient labs on 07/21/2021 showed anemia hemoglobin 9.3, thrombocytopenia platelet 77,000, renal insufficiency with a creatinine 1.4, jaundice with a bilirubin of 14.3 [worse since 07/17/2021 but was 1.4].  Goals of care discussion held advise DNR/DNI and no further chemo liver failure gets worse and to quit drinking alcohol  She did not present to the emergency department with shortness of breath requiring 4 L nasal cannula hypoxemia acute respiratory failure pulm embolism ruled out associated acute metabolic encephalopathy on admission [sepsis and SBP suspected], worsening jaundice with a bilirubin of 16, coagulopathy with INR 2.1, leukopenia, ongoing ascites, pancytopenia  On 07/25/2021: 1 unit of  blood given because of drop in hemoglobin to 6 g% and platelet 1 unit given for a platelet count of 20,000.  Also crack cocaine found in the room ongoing Foley cath hypoxemia some improvement in mental status noted with lactulose.  Nevertheless had worsening jaundice. [place] -  1. There are numerable nodules seen throughout the liver consistent with the diffuse metastases seen on prior CT and PET-CT. Within the posterior aspect of the right hepatic lobe there is a lobular cystic lesion that is similar to 07/01/2021 prior. demonstrates a very small amount of abdominopelvic ascites. There is not enough fluid to warrant therapeutic paracentesis. Procedure not performed  On 07/26/21 - continuied agitated encephalopathy and ccm consulted. Precedex infusion started. OVernight cocaine  presumedfound in her romm.  According to the daughter patient is upset that bad news and is in a frame of mind where she wants to give up.  They feel it is  stress reaction to bad news   Past Medical History:    has a past medical history of Allergy, Arthritis, Cancer ([place]), Diabetes mellitus without complication ([place]), GERD (gastroesophageal reflux disease), Hemorrhoids, Hypercholesteremia, Hypertension, Knee pain, left (2010), Pre-diabetes, and Vitamin D deficiency.   reports that she has been smoking cigarettes. She has a 1.75 pack-year smoking history. She has never used smokeless tobacco.  Past Surgical History:  Procedure Laterality Date   ABDOMINAL SURGERY     "for lower intestine cancer"   CESAREAN SECTION     2 times   HEMORRHOID SURGERY     TONSILLECTOMY      Allergies  Allergen Reactions   Benadryl [Diphenhydramine] Shortness Of Breath and Other (See Comments)    Large dose causes  shortness of breath     There is no immunization history on file for this patient.  Family History  Problem Relation Age of Onset   Hypertension Mother    Hyperlipidemia Father    Kidney disease Father    Heart disease  Father    Hypertension Maternal Grandmother    Lung cancer Sister    Breast cancer Cousin    Colon cancer Neg Hx    Rectal cancer Neg Hx    Esophageal cancer Neg Hx    Stomach cancer Neg Hx      Current Facility-Administered Medications:    0.9 %  sodium chloride infusion, , Intravenous, PRN, Kayleen Memos, DO, Stopped at 07/26/21 1426   amLODipine (NORVASC) tablet 5 mg, 5 mg, Oral, Daily, Hall, Carole N, DO, 5 mg at 07/26/21 1202   atenolol (TENORMIN) tablet 25 mg, 25 mg, Oral, BID, Hall, Carole N, DO   Chlorhexidine Gluconate Cloth 2 % PADS 6 each, 6 each, Topical, Daily, Hall, Carole N, DO, 6 each at 07/26/21 0500   dexmedetomidine (PRECEDEX) 200 MCG/50ML (4 mcg/mL) infusion, 0.4-1.2 mcg/kg/hr, Intravenous, Titrated, Armonie Mettler, Belva Crome, MD, Last Rate: 6.08 mL/hr at 07/26/21 1700, 0.5 mcg/kg/hr at 07/26/21 1700   feeding supplement (ENSURE ENLIVE / ENSURE PLUS) liquid 237 mL, 237 mL, Oral, TID BM, Hall, Carole N, DO, 237 mL at 38/46/65 9935   folic acid (FOLVITE) tablet 1 mg, 1 mg, Oral, Daily, Hall, Carole N, DO, 1 mg at 07/26/21 0941   furosemide (LASIX) injection 20 mg, 20 mg, Intravenous, Daily, Hall, Carole N, DO, 20 mg at 07/26/21 0945   lactulose (CHRONULAC) 10 GM/15ML solution 30 g, 30 g, Per Tube, BID, Hall, Carole N, DO   LORazepam (ATIVAN) tablet 1-4 mg, 1-4 mg, Oral, Q1H PRN **OR** LORazepam (ATIVAN) injection 1-4 mg, 1-4 mg, Intravenous, Q1H PRN, Hall, Carole N, DO, 2 mg at 07/26/21 1559   MEDLINE mouth rinse, 15 mL, Mouth Rinse, BID, Hall, Carole N, DO, 15 mL at 07/25/21 1059   metoprolol tartrate (LOPRESSOR) injection 2.5 mg, 2.5 mg, Intravenous, Q6H PRN, Hall, Carole N, DO, 2.5 mg at 07/26/21 1105   multivitamin with minerals tablet 1 tablet, 1 tablet, Oral, Daily, Hall, Carole N, DO, 1 tablet at 07/26/21 0943   oxyCODONE (Oxy IR/ROXICODONE) immediate release tablet 5 mg, 5 mg, Oral, Q6H PRN, Hall, Carole N, DO, 5 mg at 07/25/21 1547   piperacillin-tazobactam (ZOSYN) IVPB  3.375 g, 3.375 g, Intravenous, Q8H, Hall, Carole N, DO, Last Rate: 12.5 mL/hr at 07/26/21 1700, Infusion Verify at 07/26/21 1700   thiamine tablet 100 mg, 100 mg, Oral, Daily, 100 mg at 07/26/21 0941 **OR** thiamine (B-1) injection 100 mg, 100 mg, Intravenous, Daily, Hall, Carole N, DO  Facility-Administered Medications Ordered in Other Encounters:    cloNIDine (CATAPRES) tablet 0.2 mg, 0.2 mg, Oral, Once, Truitt Merle, MD     Significant Hospital Events:  07/24/2021 - admit 07/26/21 - ccm consult. Rx precedex    Interim History / Subjective:   07/26/2021 - seen in [place] ICU 1230  Objective   Blood pressure (!) 151/97, pulse 97, temperature 97.6 F (36.4 C), temperature source Axillary, resp. rate (!) 23, SpO2 97 %.        Intake/Output Summary (Last 24 hours) at 07/26/2021 1738 Last data filed at 07/26/2021 1700 Gross per 24 hour  Intake 163.48 ml  Output 1475 ml  Net -1311.52 ml   There were no vitals filed for this visit.  Examination: General: Extremely frail  cachectic deconditioned female.  Disheveled also.  She was agitated but after Precedex she is calm HENT: Poor dentition.  Later nasogastric tube placed Lungs: Protecting airway and normal respiratory status Cardiovascular: Sinus tachycardia Abdomen: Slightly distended abdomen and nonspecifically tender.  Suggestive of ascites but ultrasound showed only faint ascites Extremities: Lean with significant muscle mass loss.  No sacral decub Neuro: Confused.  RASS sedation score -3 after Precedex.  Prior to that betweeen +3 and -2 GU: Not examined  Resolved Hospital Problem list   X  Assessment & Plan:   PULMONARY  A:  Acute hypoxemic respiratory failure mild - Present on Admit   07/26/2021 -> protecting airway but at risk for intubation due to agitated encephalopathy  P:   Monitor closely Intubate if worse   NEUROLOGIC A:   Opiate dependent cancer pain on MS Contin and oxycodone Substance abuse with cocaine  and alcohol  Acute agitated encephalopathy -multifactorial metabolic reasons including alcohol withdrawal, cocaine toxicity, rule out intracranial lesion, worsening jaundice  - 07/26/2021 RASS between +3 and -2 improved after Precedex  P:   Started and improved on Precedex Monitor RASS goal 0 to -2 CIWA Ativan protocol Consider clonidine protocol if needed Get CT head to rule out metastasis [too unstable for MRI] Monitor for opiate withdrawal with fentanyl as needed    A:   Hypertensive but at risk for circulatory shock - Prior to & Present on Admit   - 07/26/2021: Maintaining blood pressure on Precedex  P:  DC atenolol DC amlodipine Dc lasix (looks dry) Consider clonidine protocol if needed   A: At risk for systolic versus diastolic dysfunction  P: Check echocardiogram Check ck  CARDIAC ELECTRICAL A: Sinus tachycardia  07/26/2021 looks dry  P: Fluids and monitor Dc lasix  INFECTIOUS A:   No clear-cut evidence of sepsis but at risk    P:   Antibiotics per the hospitalist Check PCt Anti-infectives (From admission, onward)    Start     Dose/Rate Route Frequency Ordered Stop   07/25/21 1500  vancomycin (VANCOCIN) IVPB 1000 mg/200 mL premix  Status:  Discontinued        1,000 mg 200 mL/hr over 60 Minutes Intravenous Every 24 hours 07/24/21 1915 07/25/21 1216   07/25/21 1400  piperacillin-tazobactam (ZOSYN) IVPB 3.375 g        3.375 g 12.5 mL/hr over 240 Minutes Intravenous Every 8 hours 07/25/21 1215     07/25/21 0200  ceFEPIme (MAXIPIME) 2 g in sodium chloride 0.9 % 100 mL IVPB  Status:  Discontinued        2 g 200 mL/hr over 30 Minutes Intravenous Every 12 hours 07/24/21 1915 07/25/21 1215   07/24/21 1400  vancomycin (VANCOCIN) IVPB 1000 mg/200 mL premix        1,000 mg 200 mL/hr over 60 Minutes Intravenous  Once 07/24/21 1350 07/24/21 1617   07/24/21 1400  ceFEPIme (MAXIPIME) 2 g in sodium chloride 0.9 % 100 mL IVPB        2 g 200 mL/hr over 30  Minutes Intravenous  Once 07/24/21 1350 07/24/21 1459        RENAL A:  Acute kidney injury -present on admission and resolved by 07/24/2021  07/26/2021 -normal creatinine  P:  Monitor     ELECTROLYTES A:  Intermittent electrolyte abnormalities  07/26/2021: Low magnesium  P: Replete magnesium   GASTROINTESTINAL A:   Liver metastasis - Prior to & Present on Admit Likely alcoholic cirrhosis - Prior to & Present  on Admit Concern for ascites -only minimal present on admit UIS - Prior to & Present on Admit Possible associated hepatic encephalopathy - Present on Admit Liver metastasis - Prior to & Present on Admit Elevated ammonia level - Present on Admit   07/26/2021 -worsening ammonia  P:   Place nasogastric tube and start lactulose  HEMATOLOGIC   - HEME A:  Anemia in the setting of pancytopenia (baseline hemoglobin 10 g%] -admit hemoglobin 10.5, chronic disease, alcohol and chemo, status post 1 unit on 07/25/2021 for hemoglobin 6.4 g%  Leukopenia-new onset 07/24/2021 likely due to chemo [6000 on 07/21/2021]  Thrombocytopenia (baseline 108,000] -severe present on admission 25,000 status post 1 unit   P:  - PRBC for hgb </= 6.9gm%    - exceptions are   -  if ACS susepcted/confirmed then transfuse for hgb </= 8.0gm%,  or    -  active bleeding with hemodynamic instability, then transfuse regardless of hemoglobin value   At at all times try to transfuse 1 unit prbc as possible with exception of active hemorrhage  Platelets for bleeding or less than 10,000  Monitor white count   ENDOCRINE A:   At risk  for hyperand hypoglycemia    P:   SSI per the hospitalist  MSK/DERM Failure to thrive - Prior to & Present on Admit ECOG 3 - Prior to & Present on Admit Poor mobility - Prior to & Present on Admit Substance abuse with cocaine and alcohol and opioid dependence due to cancer pain - Prior to & Present on Admit No sacral decub at admit  Plan  Sopcial work  consult Nutn consult No sacral decub at     [employer] (daily eval):  Diet: npo, med via pand Pain/Anxiety/Delirium protocol (if indicated): see neur VAP protocol (if indicated): x DVT prophylaxis: scd GI prophylaxis: ppi Glucose control: ssi per [facility] Mobility: bed rest Code Status: full Dispo: ICU Family Communication: -Discussed with the daughter-in-law and son-in-law with the bedside nurse present.  Discussion was in the bedside.  Daughter is aware patient has advanced cancer and the understanding that the cancer chemotherapy was not curative in intent.  Although she thought the prognosis was 50-50.  She did report that her mother is extremely upset that the bad news and is of the understanding that the cancer is terminal and she is depressed and reacting to the grief of the bad news.  She felt there is some hope that patient can get better.  She wants the patient to get better and going to get chemo again.  She also acknowledged understanding that improvement may not pan out.  She did support and align with the idea of concurrent comfort while we are trying to get the patient better.  I did express that we would monitor the patient and try to treat her and try to get her better.  The intent would be for her to get home and ultimately be in a position to get chemo.  We did discuss that in the event this was not possible what the neck steps would be.  For now continue full medical care but continuously update the daughter.  Daughter was open to meeting with palliative care to discuss various options around goals of care.   ATTESTATION & SIGNATURE   The patient Meredith Goodman is critically ill with multiple organ systems failure and requires high complexity decision making for assessment and support, frequent evaluation and titration of therapies, application of advanced monitoring technologies and extensive  interpretation of multiple databases.   Critical Care Time devoted to patient care  services described in this note is  60  Minutes. This time reflects time of care of this signee Dr Brand Males. This critical care time does not reflect procedure time, or teaching time or supervisory time of PA/NP/Med student/Med Resident etc but could involve care discussion time      SIGNATURE    Dr. Brand Males, M.D., F.C.C.P,  Pulmonary and Critical Care Medicine Staff Physician, [place] Director - Interstitial Lung Disease  Program  Pulmonary [place] [facility] [place], [place], [address]  NPI Number:  NPI #[id number]  Pager: [phone], If no answer  -> Check AMION or Try [phone] Telephone (clinical office): [phone] Telephone (research): [phone]  5:38 PM 07/26/2021   07/26/2021 5:38 PM    LABS    PULMONARY Recent Labs  Lab 07/24/21 1937  HCO3 18.3*  O2SAT 51.1    CBC Recent Labs  Lab 07/25/21 0253 07/25/21 0713 07/25/21 1438 07/26/21 0253  HGB 7.1* 6.4* 9.3* 10.6*  HCT 20.2* 18.5* 25.8* 28.9*  WBC 1.3* 1.1*  --  1.5*  PLT 23* 22*  --  50*    COAGULATION Recent Labs  Lab 07/24/21 1412 07/25/21 0253 07/26/21 0345  INR 2.1* 2.4* 1.4*    CARDIAC  No results for input(s): TROPONINI in the last 168 hours. No results for input(s): PROBNP in the last 168 hours.  CHEMISTRY Recent Labs  Lab 07/21/21 1001 07/24/21 1412 07/24/21 2227 07/25/21 0253 07/26/21 0253  NA 141 139 140 138 141  K 4.7 4.2 4.2 4.0 3.7  CL 112* 115* 113* 113* 113*  CO2 19* 18* 17* 16* 19*  GLUCOSE 91 92 84 79 136*  BUN 60* 38* 35* 36* 33*  CREATININE 1.40* 0.93 0.82 0.78 0.84  CALCIUM 7.9* 7.9* 7.9* 7.6* 8.3*  MG  --   --   --  2.0 1.7  PHOS  --   --   --  4.2 3.2   Estimated Creatinine Clearance: 51.9 mL/min (by C-G formula based on SCr of 0.84 mg/dL).   LIVER Recent Labs  Lab 07/21/21 1001 07/24/21 1412 07/24/21 2227 07/25/21 0253 07/26/21 0253 07/26/21 0345  AST 165*  147* 110* 103* 89*  --   ALT 88* 83* 62* 60* 59*  --   ALKPHOS 550* 630* 458* 451* 421*  --   BILITOT 14.3* 16.1* 12.5* 13.3* 14.2*  --   PROT 5.3* 5.9* 5.4* 4.8* 5.5*  --   ALBUMIN 2.8* 2.3* 2.5* 2.0* 2.5*  --   INR  --  2.1*  --  2.4*  --  1.4*     INFECTIOUS Recent Labs  Lab 07/24/21 1623 07/25/21 0253  LATICACIDVEN 1.5  --   PROCALCITON  --  2.55     ENDOCRINE CBG (last 3)  No results for input(s): GLUCAP in the last 72 hours.       IMAGING x48h  - image(s) personally visualized  -   highlighted in bold US Abdomen Complete  Result Date: 07/25/2021 CLINICAL DATA:  Transaminitis. 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 EXAM: ABDOMEN ULTRASOUND COMPLETE COMPARISON:  None. FINDINGS: Gallbladder: There is diffuse heterogeneity of the gallbladder lumen with hyperechoic and anechoic regions and partial shadowing. This may be secondary to diffuse gallstones. The gallbladder wall thickness measures 3 mm, at the upper limits of normal. Common bile duct:  Diameter: 4 mm, within normal limits Liver: Mildly nodular liver contour. Numerous hypoechoic nodular densities are seen throughout the liver consistent with previously reported metastatic disease. Within the right hepatic lobe there is a peripheral subcapsular anechoic avascular 2.1 x 1.2 x 2.0 cm cyst. At the periphery of this cyst is a tiny echogenic nodule measuring up to 6 mm that may represent one of the innumerable parenchymal nodules seen throughout the liver. Portal vein is patent on color Doppler imaging with normal direction of blood flow towards the liver. IVC: No abnormality visualized. Pancreas: Visualized portion unremarkable. Spleen: Size and appearance within normal limits. Right Kidney: Length: 9.7 cm. Echogenicity within normal limits. No mass or hydronephrosis visualized. Left Kidney: Length: 9.3 cm. Echogenicity within normal limits. No mass or hydronephrosis visualized. Abdominal aorta: No  aneurysm visualized. The aortic bifurcation was not visualized. Other findings: Small left pleural effusion. Mild ascites around the liver and spleen. IMPRESSION:: IMPRESSION: 1. There are numerable nodules seen throughout the liver consistent with the diffuse metastases seen on prior CT and PET-CT. Within the posterior aspect of the right hepatic lobe there is a lobular cystic lesion that is similar to 07/01/2021 prior CT. 2. There is diffuse heterogeneity of the gallbladder lumen. No gallbladder wall thickening. This may represent numerous gallstones. Gallbladder malignancy is felt less likely. 3. Mild ascites.  Small left pleural effusion. Electronically Signed   By: Yvonne Kendall M.D.   On: 07/25/2021 09:16   [place] ASCITES (ABDOMEN LIMITED)  Result Date: 07/25/2021 CLINICAL DATA:  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 COMPARISON:  07/01/2021 FINDINGS: Survey of the abdominal 4 quadrants demonstrates a very small amount of abdominopelvic ascites. There is not enough fluid to warrant therapeutic paracentesis. Procedure not performed. IMPRESSION: Trace abdominopelvic ascites. Electronically Signed   By: Jerilynn Mages.  Shick M.D.   On: 07/25/2021 15:03

## 2021-07-26 NOTE — Progress Notes (Addendum)
PROGRESS NOTE  Meredith Goodman OJJ:[id number] DOB: 1955-11-05 DOA: 07/24/2021 PCP: Pcp, No  HPI/Recap of past 24 hours:  Meredith Goodman is a 66 y.o. female with medical history significant for malignant carcinoid of the ileum, small cell lung cancer, recent diagnosis of liver metastasis postchemotherapy a week ago, essential hypertension, who presented to [facility] ED due to sudden onset shortness of breath of 1 day duration.    Work-up revealed hepatic encephalopathy, acute hypoxic respiratory failure secondary to right upper lobe pneumonia versus right upper lobe lung mass, jaundice with decompensated liver failure, coagulopathy.  Meredith prognosis is very poor therefore palliative care was consulted.    UDS positive for opiates and cocaine on 07/24/2021.  07/26/2021: Patient was seen and examined at bedside.  She is hypersomnolent but arousable to painful stimuli.  Overnight there was findings of a white substance which looked like a rock.  Patient's visitors have been restricted.      Assessment/Plan: Principal Problem:   Acute respiratory failure with hypoxia (HCC) Active Problems:   Protein-calorie malnutrition, severe  Persistent acute hypoxic respiratory failure secondary to lung mass versus infiltrate seen on CT scan Currently on 4 L with O2 saturation 100% Maintain O2 saturation greater than 90% Wean off oxygen supplementation as tolerated Continue broad IV antibiotics coverage for now Oncology consulted due to possibility of a pulmonary mass   Acute hepatic encephalopathy Elevated ammonia level on presentation Improved mental status with lactulose Continue lactulose Goal BM 2 to 4/day Diagnostic paracentesis could not be performed by IR, not enough intra-abdominal fluid on 07/25/2021.    Acute blood loss anemia, rule out intra-abdominal bleed Abdominal ultrasound ordered GI consulted Hemoglobin dropped to 6.0, transfuse 1 unit Repeat CBC after transfusion, post 1 unit PRBC  transfusion hemoglobin is 10.6 this morning Goal hemoglobin 7-9 Platelet count 20,000, transfuse 1 unit of platelet goal platelet count of 50,000 if bleeding   Sepsis, suspect intra-abdominal source Leukopenia 1.5, tachycardia 124 IR paracentesis with fluid analysis, follow results Follow blood cultures and urine culture Received IV fluid in the ED Started on IV cefepime and IV vancomycin in the ED  MRSA screening test negative, IV vancomycin DC'd on 07/25/2018. Monitor fever curve and WBC Maintain MAP greater than 65   Small abdominal ascites in the setting of recently diagnosed mets to the liver IR consulted for paracentesis diagnostic and therapeutic Rule out SBP, this could not be performed, not enough intra-abdominal fluid.   Elevated liver chemistries/jaundice in the setting of liver metastasis T bili 16, alkaline phosphatase, AST ALT elevated Continue to trend LFTs To bilirubin uptrending 14.2 from 13.3. Avoid hepatotoxic agents Continue lactulose Completed IV albumin 12.5 mg x 4 doses Avoid sedative agents GI following, appreciate assistance.   Treated coagulopathy in the setting of advanced liver disease INR 2.1> 1.4 Received a dose of IV vitamin K 5 mg.  Uncontrolled hypertension Resume home oral antihypertensives Continue diuretics Monitor blood pressure and avoid hypotension  Treated thrombocytopenia Received 1 unit of platelet on 07/25/2021 for platelet count of 22   Leukopenia with recent chemotherapy IV antibiotics empirically with broad coverage Repeat CBC with differential in the morning Seen by medical oncology.   Pancytopenia likely secondary to recent chemotherapy All 3 cell lines are down Medical oncology following.  Chronic pain syndrome Minimize use of narcotics and sedating agents.  Severe protein calorie malnutrition in the setting of advanced liver disease Dietitian following.  Polysubstance abuse including cocaine TOC consulted to  provide resources for polysubstance cessation  Goals of care Palliative care team consulted to assist with establishing goals of care Very poor prognosis in the setting of advanced cancer, decompensated advanced liver disease, liver metastasis, ongoing intermittent alcohol use, drug use.   Critical care time: 65 minutes.     DVT prophylaxis: SCDs due to high risk of bleeding   Code Status: Full code per Meredith Goodman at bedside   Family Communication: Goodman at bedside   Disposition Plan: Admitted to stepdown unit   Consults called: Consult oncology and GI in the morning   Admission status: Inpatient status     Status is: Inpatient   Patient requires at least 2 midnights for further evaluation and treatment of present condition.                Objective: Vitals:   07/26/21 0527 07/26/21 0600 07/26/21 0800 07/26/21 0942  BP: (!) 176/96 (!) 186/103  (!) 185/98  Pulse: 98 (!) 102  (!) 114  Resp: (!) 23 17    Temp:   98.6 F (37 C)   TempSrc:   Oral   SpO2: 96% 97%      Intake/Output Summary (Last 24 hours) at 07/26/2021 1024 Last data filed at 07/26/2021 1000 Gross per 24 hour  Intake 1270.9 ml  Output 1600 ml  Net -329.1 ml   There were no vitals filed for this visit.  Exam:  General: 66 y.o. year-old female frail-appearing in no acute distress.  She is hypersomnolent.   Cardiovascular: Regular rate and rhythm no rubs or gallops.   Respiratory: Mild rales at bases no wheezing noted.  [place] effort.   Abdomen: Mildly distended bowel sounds present.   Musculoskeletal: 2+ pitting edema lower extremities bilateral  skin: No ulcerations lesions or rashes. Psychiatry: Mood is appropriate for condition and setting.   Data Reviewed: CBC: Recent Labs  Lab 07/21/21 1001 07/21/21 1001 07/24/21 1412 07/24/21 2227 07/25/21 0253 07/25/21 0713 07/25/21 1438 07/26/21 0253  WBC 6.3  --  1.5* 1.3* 1.3* 1.1*  --  1.5*  NEUTROABS 5.5  --  0.6* 0.4* 0.4*   --   --  0.3*  HGB 9.3*   < > 8.7* 10.5* 7.1* 6.4* 9.3* 10.6*  HCT 25.6*  --  24.7* 29.3* 20.2* 18.5* 25.8* 28.9*  MCV 85.6  --  86.7 85.9 87.8 88.9  --  83.3  PLT 77*  --  32* 25* 23* 22*  --  50*   < > = values in this interval not displayed.   Basic Metabolic Panel: Recent Labs  Lab 07/21/21 1001 07/24/21 1412 07/24/21 2227 07/25/21 0253 07/26/21 0253  NA 141 139 140 138 141  K 4.7 4.2 4.2 4.0 3.7  CL 112* 115* 113* 113* 113*  CO2 19* 18* 17* 16* 19*  GLUCOSE 91 92 84 79 136*  BUN 60* 38* 35* 36* 33*  CREATININE 1.40* 0.93 0.82 0.78 0.84  CALCIUM 7.9* 7.9* 7.9* 7.6* 8.3*  MG  --   --   --  2.0 1.7  PHOS  --   --   --  4.2 3.2   GFR: Estimated Creatinine Clearance: 51.9 mL/min (by C-G formula based on SCr of 0.84 mg/dL). Liver Function Tests: Recent Labs  Lab 07/21/21 1001 07/24/21 1412 07/24/21 2227 07/25/21 0253 07/26/21 0253  AST 165* 147* 110* 103* 89*  ALT 88* 83* 62* 60* 59*  ALKPHOS 550* 630* 458* 451* 421*  BILITOT 14.3* 16.1* 12.5* 13.3* 14.2*  PROT 5.3* 5.9* 5.4* 4.8* 5.5*  ALBUMIN 2.8* 2.3* 2.5* 2.0* 2.5*   No results for input(s): LIPASE, AMYLASE in the last 168 hours. Recent Labs  Lab 07/24/21 1412 07/25/21 0713  AMMONIA 57* 94*   Coagulation Profile: Recent Labs  Lab 07/24/21 1412 07/25/21 0253 07/26/21 0345  INR 2.1* 2.4* 1.4*   Cardiac Enzymes: No results for input(s): CKTOTAL, CKMB, CKMBINDEX, TROPONINI in the last 168 hours. BNP (last 3 results) No results for input(s): PROBNP in the last 8760 hours. HbA1C: No results for input(s): HGBA1C in the last 72 hours. CBG: No results for input(s): GLUCAP in the last 168 hours. Lipid Profile: No results for input(s): CHOL, HDL, LDLCALC, TRIG, CHOLHDL, LDLDIRECT in the last 72 hours. Thyroid Function Tests: No results for input(s): TSH, T4TOTAL, FREET4, T3FREE, THYROIDAB in the last 72 hours. Anemia Panel: No results for input(s): VITAMINB12, FOLATE, FERRITIN, TIBC, IRON, RETICCTPCT in  the last 72 hours. Urine analysis:    Component Value Date/Time   COLORURINE YELLOW 07/01/2021 [place] 07/01/2021 1613   LABSPEC 1.014 07/01/2021 1613   PHURINE 7.0 07/01/2021 1613   GLUCOSEU NEGATIVE 07/01/2021 1613   HGBUR SMALL (A) 07/01/2021 1613   BILIRUBINUR NEGATIVE 07/01/2021 1613   KETONESUR NEGATIVE 07/01/2021 1613   PROTEINUR NEGATIVE 07/01/2021 1613   UROBILINOGEN 0.2 12/26/2016 1259   NITRITE NEGATIVE 07/01/2021 1613   LEUKOCYTESUR NEGATIVE 07/01/2021 1613   Sepsis Labs: @LABRCNTIP (procalcitonin:4,lacticidven:4)  ) Recent Results (from the past 240 hour(s))  Urine Culture     Status: Abnormal   Collection Time: 07/24/21  1:49 PM   Specimen: In/Out Cath Urine  Result Value Ref Range Status   Specimen Description   Final    IN/OUT CATH URINE Performed at [facility], [place] [address], [place], [place] [address]    Special Requests   Final    Immunocompromised Performed at [facility], [place] [address] [place], [place] [address]    Culture MULTIPLE SPECIES PRESENT, SUGGEST RECOLLECTION (A)  Final   Report Status 07/26/2021 FINAL  Final  Blood Culture (routine x 2)     Status: None (Preliminary result)   Collection Time: 07/24/21  2:20 PM   Specimen: BLOOD  Result Value Ref Range Status   Specimen Description   Final    BLOOD BLOOD LEFT FOREARM Performed at [facility] [address] [place], [place] [address]    Special Requests   Final    BOTTLES DRAWN AEROBIC AND ANAEROBIC Blood Culture results may not be optimal due to an inadequate volume of blood received in culture bottles Performed at [facility] [address] [place], [place] [address]    Culture   Final    NO GROWTH < 24 HOURS Performed at [facility] [address], [place], [place] [address]    Report Status PENDING  Incomplete  Blood Culture (routine x 2)     Status: None (Preliminary  result)   Collection Time: 07/24/21  2:25 PM   Specimen: BLOOD  Result Value Ref Range Status   Specimen Description   Final    BLOOD BLOOD RIGHT FOREARM Performed at [facility] [address] [place], [place] [address]    Special Requests   Final    BOTTLES DRAWN AEROBIC AND ANAEROBIC Blood Culture results may not be optimal due to an inadequate volume of blood received in culture bottles Performed at [facility] [address], [place], [place] [address]  Culture   Final    NO GROWTH < 24 HOURS Performed at [facility], [place] [address] [place], [place] [address]    Report Status PENDING  Incomplete  Resp Panel by RT-PCR (Flu A&B, Covid) Nasopharyngeal Swab     Status: None   Collection Time: 07/24/21  2:36 PM   Specimen: Nasopharyngeal Swab; Nasopharyngeal(NP) swabs in vial transport medium  Result Value Ref Range Status   SARS Coronavirus 2 by RT PCR NEGATIVE NEGATIVE Final    Comment: (NOTE) SARS-CoV-2 target nucleic acids are NOT DETECTED.  The SARS-CoV-2 RNA is generally detectable in upper respiratory specimens during the acute phase of infection. The lowest concentration of SARS-CoV-2 viral copies this assay can detect is 138 copies/mL. A negative result does not preclude SARS-Cov-2 infection and should not be used as the sole basis for treatment or other patient management decisions. A negative result may occur with  improper specimen collection/handling, submission of specimen other than nasopharyngeal swab, presence of viral mutation(s) within the areas targeted by this assay, and inadequate number of viral copies(<138 copies/mL). A negative result must be combined with clinical observations, patient history, and epidemiological information. The expected result is Negative.  Fact Sheet for Patients:  EntrepreneurPulse.com.au  Fact Sheet for Healthcare Providers:   IncredibleEmployment.be  This test is no t yet approved or cleared by the [place] FDA and  has been authorized for detection and/or diagnosis of SARS-CoV-2 by FDA under an Emergency Use Authorization (EUA). This EUA will remain  in effect (meaning this test can be used) for the duration of the COVID-19 declaration under Section 564(b)(1) of the Act, 21 U.S.C.section 360bbb-3(b)(1), unless the authorization is terminated  or revoked sooner.       Influenza A by PCR NEGATIVE NEGATIVE Final   Influenza B by PCR NEGATIVE NEGATIVE Final    Comment: (NOTE) The Xpert Xpress SARS-CoV-2/FLU/RSV plus assay is intended as an aid in the diagnosis of influenza from Nasopharyngeal swab specimens and should not be used as a sole basis for treatment. Nasal washings and aspirates are unacceptable for Xpert Xpress SARS-CoV-2/FLU/RSV testing.  Fact Sheet for Patients: EntrepreneurPulse.com.au  Fact Sheet for Healthcare Providers: IncredibleEmployment.be  This test is not yet approved or cleared by the [place] FDA and has been authorized for detection and/or diagnosis of SARS-CoV-2 by FDA under an Emergency Use Authorization (EUA). This EUA will remain in effect (meaning this test can be used) for the duration of the COVID-19 declaration under Section 564(b)(1) of the Act, 21 U.S.C. section 360bbb-3(b)(1), unless the authorization is terminated or revoked.  Performed at [facility], [place] [address] [place], [place] [address]   MRSA Next Gen by PCR, Nasal     Status: None   Collection Time: 07/24/21  6:56 PM   Specimen: Nasal Mucosa; Nasal Swab  Result Value Ref Range Status   MRSA by PCR Next Gen NOT DETECTED NOT DETECTED Final    Comment: (NOTE) The GeneXpert MRSA Assay (FDA approved for NASAL specimens only), is one component of a comprehensive MRSA colonization surveillance program. It is not intended  to diagnose MRSA infection nor to guide or monitor treatment for MRSA infections. Test performance is not FDA approved in patients less than 64 years old. Performed at [facility], [place] [address] [place], [place] [address]       Studies: [place] ASCITES (ABDOMEN LIMITED)  Result Date: 07/25/2021 CLINICAL DATA:  Abdominal distension history of metastatic carcinoid of the  ileum EXAM: LIMITED ABDOMEN ULTRASOUND FOR ASCITES TECHNIQUE: Limited ultrasound survey for ascites was performed in all four abdominal quadrants. COMPARISON:  07/01/2021 FINDINGS: Survey of the abdominal 4 quadrants demonstrates a very small amount of abdominopelvic ascites. There is not enough fluid to warrant therapeutic paracentesis. Procedure not performed. IMPRESSION: Trace abdominopelvic ascites. Electronically Signed   By: Jerilynn Mages.  Shick M.D.   On: 07/25/2021 15:03    Scheduled Meds:  atenolol  12.5 mg Oral BID   Chlorhexidine Gluconate Cloth  6 each Topical Daily   feeding supplement  237 mL Oral TID BM   folic acid  1 mg Oral Daily   furosemide  20 mg Intravenous Daily   lactulose  20 g Oral BID   mouth rinse  15 mL Mouth Rinse BID   multivitamin with minerals  1 tablet Oral Daily   thiamine  100 mg Oral Daily   Or   thiamine  100 mg Intravenous Daily    Continuous Infusions:  piperacillin-tazobactam (ZOSYN)  IV Stopped (07/26/21 0926)     LOS: 2 days     Kayleen Memos, MD [facility] Pager [phone]  If 7PM-7AM, please contact night-coverage [email or web address] Password [facility] 07/26/2021, 10:24 AM

## 2021-07-26 NOTE — Progress Notes (Signed)
° °  Patient Name: Meredith Goodman Date of Encounter: 07/26/2021, 10:50 AM    Subjective  Remains somnolent, getting lactulose this morning  [place] substance found in room overnight and confiscated and visitors restricted urine drug screen positive for opiates and cocaine on January 12   Objective  BP (!) 185/98    Pulse (!) 114    Temp 98.6 F (37 C) (Oral)    Resp 17    SpO2 97%  Acutely and chronically ill, looks weak, able to converse and answer questions appropriately Abdomen is protuberant soft, possible ascites non-tender   Recent Labs  Lab 07/21/21 1001 07/24/21 1412 07/24/21 2227 07/25/21 0253 07/26/21 0253 07/26/21 0345  AST 165* 147* 110* 103* 89*  --   ALT 88* 83* 62* 60* 59*  --   ALKPHOS 550* 630* 458* 451* 421*  --   BILITOT 14.3* 16.1* 12.5* 13.3* 14.2*  --   PROT 5.3* 5.9* 5.4* 4.8* 5.5*  --   ALBUMIN 2.8* 2.3* 2.5* 2.0* 2.5*  --   INR  --  2.1*  --  2.4*  --  1.4*   CBC Latest Ref Rng & Units 07/26/2021 07/25/2021 07/25/2021  WBC 4.0 - 10.5 K/uL 1.5(L) - 1.1(LL)  Hemoglobin 12.0 - 15.0 g/dL 10.6(L) 9.3(L) 6.4(LL)  Hematocrit 36.0 - 46.0 % 28.9(L) 25.8(L) 18.5(L)  Platelets 150 - 400 K/uL 50(L) - 22(LL)     Recent Labs  Lab 07/21/21 1001 07/24/21 1412 07/24/21 2227 07/25/21 0253 07/26/21 0253  NA 141 139 140 138 141  K 4.7 4.2 4.2 4.0 3.7  CL 112* 115* 113* 113* 113*  CO2 19* 18* 17* 16* 19*  GLUCOSE 91 92 84 79 136*  BUN 60* 38* 35* 36* 33*  CREATININE 1.40* 0.93 0.82 0.78 0.84  CALCIUM 7.9* 7.9* 7.9* 7.6* 8.3*  MG  --   --   --  2.0 1.7  PHOS  --   --   --  4.2 3.2     Assessment and Plan  Acute liver failure with 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  There is biochemical improvement so some of her current problems may be chemotherapy toxicity.  However likelihood of  surviving this cancer in these problems remains extremely low.  Continue care as you are with lactulose, supportive care otherwise.  We will follow-up tomorrow I agree with oncology that DNR is appropriate  Gatha Mayer, MD, [facility] 07/26/2021 10:50 AM

## 2021-07-27 ENCOUNTER — Inpatient Hospital Stay (HOSPITAL_COMMUNITY): Payer: Commercial Managed Care - HMO

## 2021-07-27 ENCOUNTER — Inpatient Hospital Stay: Payer: Self-pay

## 2021-07-27 DIAGNOSIS — J9601 Acute respiratory failure with hypoxia: Secondary | ICD-10-CM | POA: Diagnosis not present

## 2021-07-27 DIAGNOSIS — K7201 Acute and subacute hepatic failure with coma: Secondary | ICD-10-CM | POA: Diagnosis not present

## 2021-07-27 DIAGNOSIS — G9340 Encephalopathy, unspecified: Secondary | ICD-10-CM | POA: Diagnosis not present

## 2021-07-27 DIAGNOSIS — C787 Secondary malignant neoplasm of liver and intrahepatic bile duct: Secondary | ICD-10-CM | POA: Diagnosis not present

## 2021-07-27 LAB — CBC WITH DIFFERENTIAL/PLATELET
Abs Immature Granulocytes: 0.14 10*3/uL — ABNORMAL HIGH (ref 0.00–0.07)
Basophils Absolute: 0 10*3/uL (ref 0.0–0.1)
Basophils Relative: 1 %
Eosinophils Absolute: 0 10*3/uL (ref 0.0–0.5)
Eosinophils Relative: 2 %
HCT: 22.3 % — ABNORMAL LOW (ref 36.0–46.0)
Hemoglobin: 8.1 g/dL — ABNORMAL LOW (ref 12.0–15.0)
Immature Granulocytes: 11 %
Lymphocytes Relative: 63 %
Lymphs Abs: 0.8 10*3/uL (ref 0.7–4.0)
MCH: 30.6 pg (ref 26.0–34.0)
MCHC: 36.3 g/dL — ABNORMAL HIGH (ref 30.0–36.0)
MCV: 84.2 fL (ref 80.0–100.0)
Monocytes Absolute: 0.2 10*3/uL (ref 0.1–1.0)
Monocytes Relative: 12 %
Neutro Abs: 0.2 10*3/uL — CL (ref 1.7–7.7)
Neutrophils Relative %: 11 %
Platelets: 38 10*3/uL — ABNORMAL LOW (ref 150–400)
RBC: 2.65 MIL/uL — ABNORMAL LOW (ref 3.87–5.11)
RDW: 19.2 % — ABNORMAL HIGH (ref 11.5–15.5)
WBC: 1.3 10*3/uL — CL (ref 4.0–10.5)
nRBC: 11.5 % — ABNORMAL HIGH (ref 0.0–0.2)

## 2021-07-27 LAB — CK TOTAL AND CKMB (NOT AT ARMC)
CK, MB: 2.1 ng/mL (ref 0.5–5.0)
Relative Index: INVALID (ref 0.0–2.5)
Total CK: 80 U/L (ref 38–234)

## 2021-07-27 LAB — COMPREHENSIVE METABOLIC PANEL
ALT: 50 U/L — ABNORMAL HIGH (ref 0–44)
AST: 64 U/L — ABNORMAL HIGH (ref 15–41)
Albumin: 2.1 g/dL — ABNORMAL LOW (ref 3.5–5.0)
Alkaline Phosphatase: 359 U/L — ABNORMAL HIGH (ref 38–126)
Anion gap: 9 (ref 5–15)
BUN: 29 mg/dL — ABNORMAL HIGH (ref 8–23)
CO2: 23 mmol/L (ref 22–32)
Calcium: 8 mg/dL — ABNORMAL LOW (ref 8.9–10.3)
Chloride: 112 mmol/L — ABNORMAL HIGH (ref 98–111)
Creatinine, Ser: 0.9 mg/dL (ref 0.44–1.00)
GFR, Estimated: >60 mL/min (ref >60)
Glucose, Bld: 157 mg/dL — ABNORMAL HIGH (ref 70–99)
Potassium: 2.6 mmol/L — CL (ref 3.5–5.1)
Sodium: 144 mmol/L (ref 135–145)
Total Bilirubin: 11.6 mg/dL — ABNORMAL HIGH (ref 0.3–1.2)
Total Protein: 4.8 g/dL — ABNORMAL LOW (ref 6.5–8.1)

## 2021-07-27 LAB — GLUCOSE, CAPILLARY
Glucose-Capillary: 134 mg/dL — ABNORMAL HIGH (ref 70–99)
Glucose-Capillary: 113 mg/dL — ABNORMAL HIGH (ref 70–99)
Glucose-Capillary: 110 mg/dL — ABNORMAL HIGH (ref 70–99)
Glucose-Capillary: 133 mg/dL — ABNORMAL HIGH (ref 70–99)

## 2021-07-27 LAB — PHOSPHORUS: Phosphorus: 3.4 mg/dL (ref 2.5–4.6)

## 2021-07-27 LAB — ECHOCARDIOGRAM COMPLETE
AR max vel: 2.51 cm2
AV Peak grad: 6.1 mmHg
Ao pk vel: 1.23 m/s
Area-P 1/2: 4.8 cm2
Calc EF: 61.7 %
S' Lateral: 2.4 cm
Single Plane A2C EF: 62.7 %
Single Plane A4C EF: 61.4 %

## 2021-07-27 LAB — PROTIME-INR
INR: 1.4 — ABNORMAL HIGH (ref 0.8–1.2)
Prothrombin Time: 17.4 seconds — ABNORMAL HIGH (ref 11.4–15.2)

## 2021-07-27 LAB — LACTIC ACID, PLASMA: Lactic Acid, Venous: 1.5 mmol/L (ref 0.5–1.9)

## 2021-07-27 LAB — MAGNESIUM: Magnesium: 2.1 mg/dL (ref 1.7–2.4)

## 2021-07-27 MED ORDER — MIDODRINE HCL 5 MG PO TABS: 5.0000 mg | ORAL_TABLET | Freq: Three times a day (TID) | ORAL | Status: DC

## 2021-07-27 MED ORDER — LIP MEDEX EX OINT
TOPICAL_OINTMENT | CUTANEOUS | Status: DC | PRN
  Administered 2021-07-27 – 2021-07-28 (×3): 1 via TOPICAL
  Filled 2021-07-27: qty 7

## 2021-07-27 MED ORDER — POTASSIUM CHLORIDE 20 MEQ PO PACK
40.0000 meq | PACK | Freq: Two times a day (BID) | ORAL | Status: AC
  Administered 2021-07-27 (×2): 40 meq
  Filled 2021-07-27 (×2): qty 2

## 2021-07-27 MED ORDER — LACTATED RINGERS IV BOLUS
500.0000 mL | Freq: Once | INTRAVENOUS | Status: AC
  Administered 2021-07-27: 500 mL via INTRAVENOUS

## 2021-07-27 MED ORDER — POTASSIUM CHLORIDE 10 MEQ/100ML IV SOLN
10.0000 meq | INTRAVENOUS | Status: AC
  Administered 2021-07-27 (×6): 10 meq via INTRAVENOUS
  Filled 2021-07-27 (×6): qty 100

## 2021-07-27 MED ORDER — OSMOLITE 1.5 CAL PO LIQD
1000.0000 mL | ORAL | Status: DC
  Administered 2021-07-27 – 2021-07-28 (×2): 1000 mL
  Filled 2021-07-27 (×6): qty 1000

## 2021-07-27 MED ORDER — FOLIC ACID 1 MG PO TABS
1.0000 mg | ORAL_TABLET | Freq: Every day | ORAL | Status: DC
  Administered 2021-07-28 – 2021-07-29 (×2): 1 mg
  Filled 2021-07-27 (×2): qty 1

## 2021-07-27 MED ORDER — SODIUM CHLORIDE 0.9% FLUSH
10.0000 mL | Freq: Two times a day (BID) | INTRAVENOUS | Status: DC
  Administered 2021-07-27 (×2): 30 mL
  Administered 2021-07-28: 10 mL
  Administered 2021-07-28: 30 mL
  Administered 2021-07-29 (×2): 10 mL
  Administered 2021-07-30: 30 mL

## 2021-07-27 MED ORDER — OSMOLITE 1.2 CAL PO LIQD: 1000.0000 mL | ORAL | Status: DC

## 2021-07-27 MED ORDER — THIAMINE HCL 100 MG PO TABS
100.0000 mg | ORAL_TABLET | Freq: Every day | ORAL | Status: DC
  Administered 2021-07-28: 100 mg
  Filled 2021-07-27 (×2): qty 1

## 2021-07-27 MED ORDER — POTASSIUM CHLORIDE 10 MEQ/100ML IV SOLN: 10.0000 meq | INTRAVENOUS | Status: DC

## 2021-07-27 MED ORDER — THIAMINE HCL 100 MG/ML IJ SOLN
100.0000 mg | Freq: Every day | INTRAMUSCULAR | Status: DC
  Administered 2021-07-29: 100 mg via INTRAVENOUS
  Filled 2021-07-27 (×2): qty 2

## 2021-07-27 MED ORDER — ADULT MULTIVITAMIN W/MINERALS CH
1.0000 | ORAL_TABLET | Freq: Every day | ORAL | Status: DC
  Administered 2021-07-28 – 2021-07-29 (×2): 1
  Filled 2021-07-27 (×2): qty 1

## 2021-07-27 MED ORDER — PROSOURCE TF PO LIQD
45.0000 mL | Freq: Every day | ORAL | Status: DC
  Administered 2021-07-27 – 2021-07-29 (×3): 45 mL
  Filled 2021-07-27 (×4): qty 45

## 2021-07-27 MED ORDER — FREE WATER
175.0000 mL | Freq: Four times a day (QID) | Status: DC
  Administered 2021-07-27 – 2021-07-28 (×5): 175 mL

## 2021-07-27 MED ORDER — INSULIN ASPART 100 UNIT/ML IJ SOLN
0.0000 [IU] | INTRAMUSCULAR | Status: DC
  Administered 2021-07-27 (×2): 1 [IU] via SUBCUTANEOUS
  Administered 2021-07-28: 2 [IU] via SUBCUTANEOUS
  Administered 2021-07-28 (×3): 1 [IU] via SUBCUTANEOUS
  Administered 2021-07-28 – 2021-07-29 (×4): 2 [IU] via SUBCUTANEOUS
  Administered 2021-07-29 (×2): 1 [IU] via SUBCUTANEOUS

## 2021-07-27 MED ORDER — MIDODRINE HCL 5 MG PO TABS
10.0000 mg | ORAL_TABLET | Freq: Three times a day (TID) | ORAL | Status: DC
  Administered 2021-07-27 – 2021-07-29 (×8): 10 mg
  Filled 2021-07-27 (×8): qty 2

## 2021-07-27 MED ORDER — SODIUM CHLORIDE 0.9% FLUSH
10.0000 mL | INTRAVENOUS | Status: DC | PRN
  Administered 2021-08-01: 10 mL

## 2021-07-27 NOTE — Progress Notes (Signed)
0500 Nurse spoke with provider Nevada Crane, DO in reference to patients urine color and need for more IV access. Se  orders tab for new orders. Will continue to continue.

## 2021-07-27 NOTE — Progress Notes (Signed)
Patient is in need of IV accesses.  Staff attempted to contact patient's daughter for consent of PICC line placement, but unsuccessfully.  Due to medical necessity, a PICC line was ordered to be placed in order to administer essential IV medications.  Will re-attempt to contact the daughter and update her on current medical condition.  The writer provided consent on PICC line placement.

## 2021-07-27 NOTE — Progress Notes (Signed)
Peripherally Inserted Central Catheter Placement  The IV Nurse has discussed with the patient and/or persons authorized to consent for the patient, the purpose of this procedure and the potential benefits and risks involved with this procedure.  The benefits include less needle sticks, lab draws from the catheter, and the patient may be discharged home with the catheter. Risks include, but not limited to, infection, bleeding, blood clot (thrombus formation), and puncture of an artery; nerve damage and irregular heartbeat and possibility to perform a PICC exchange if needed/ordered by physician.  Alternatives to this procedure were also discussed.  Bard Power PICC patient education guide, fact sheet on infection prevention and patient information card has been provided to patient /or left at bedside.  Telephone consent obtained from dtr.  PICC Placement Documentation  PICC Triple Lumen 04/28/50 PICC Right Basilic 34 cm 0 cm (Active)  Indication for Insertion or Continuance of Line Vasoactive infusions;Prolonged intravenous therapies;Limited venous access - need for IV therapy >5 days (PICC only) 07/27/21 0916  Exposed Catheter (cm) 0 cm 07/27/21 0916  Site Assessment Clean;Dry;Intact 07/27/21 0916  Lumen #1 Status Flushed;Saline locked;Blood return noted 07/27/21 0916  Lumen #2 Status Flushed;Saline locked;Blood return noted 07/27/21 0916  Lumen #3 Status Flushed;Saline locked;Blood return noted 07/27/21 0916  Dressing Type Transparent;Securing device 07/27/21 0916  Dressing Status Clean;Dry;Intact 07/27/21 0916  Antimicrobial disc in place? Yes 07/27/21 0916  Safety Lock Not Applicable 02/58/52 7782  Line Care Connections checked and tightened 07/27/21 0916  Line Adjustment (NICU/IV Team Only) No 07/27/21 0916  Dressing Intervention New dressing 07/27/21 0916  Dressing Change Due 08/03/21 07/27/21 0916       Rolena Infante 07/27/2021, 9:16 AM
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
## 2021-07-27 NOTE — Progress Notes (Signed)
Called daughter of patient for consent for PICC line placement. No answer at this time. RN to speak with Dr. [place] Crane for further direction.

## 2021-07-27 NOTE — Progress Notes (Signed)
Patient has a silver bracelet on right wrist. Meredith Goodman patient's daughter took home some ear rings and other jewelry for the patient. Nurse also noted that the patient has a cellphone in the room and lipstick. Will continue to monitor.

## 2021-07-27 NOTE — Consult Note (Signed)
NAME:  Meredith Goodman, MRN:  [id number], DOB:  26-Mar-1956, LOS: 3 ADMISSION DATE:  07/24/2021, CONSULTATION DATE: 0!630 REFERRING MD:  Dr Irene Pap of [facility], CHIEF COMPLAINT:  Acute Encephalopathy - agitated   BRIEF    66 year old frail cachectic malnourished female who is an smoker, alcoholic and does crack cocaine [UDS this admission positive for opioids and cocaine], along with prescription for oxycodone and MS Contin for chronic cancer-related pain with declining performance status most recently ECOG 3 on 07/21/21 oncology visit.  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  Then in the summer 2022 had protective mediastinal adenopathy that by December 2022 showed significant amount of liveShe completed palliative chemo cycle 1 days 1-3 on 1/5 - 1/7.  Carboplatin on day 1, etoposide on days 1-3 with GCSF on day 5, and atezolizumab q3 weeks.  Most recent oncology visit on 07/21/2021 she was found to be in the wheelchair, ongoing drinking, ongoing substance abuse nausea and vomiting and jaundice, loss of prescription opioids, cough and dyspnea.  Outpatient labs on 07/21/2021 showed anemia hemoglobin 9.3, thrombocytopenia platelet 77,000, renal insufficiency with a creatinine 1.4, jaundice with a bilirubin of 14.3 [worse since 07/17/2021 but was 1.4].  Goals of care discussion held advise DNR/DNI and no further chemo liver failure gets worse and to quit drinking alcohol  She did not present to the emergency department with shortness of breath requiring 4 L nasal cannula hypoxemia acute respiratory failure pulm embolism ruled out associated acute metabolic encephalopathy on admission [sepsis and SBP suspected], worsening jaundice with a bilirubin of 16, coagulopathy with INR 2.1, leukopenia, ongoing ascites, pancytopenia   Past Medical History:    has a past medical history of Allergy, Arthritis, Cancer ([place]), Diabetes mellitus without  complication ([place]), GERD (gastroesophageal reflux disease), Hemorrhoids, Hypercholesteremia, Hypertension, Knee pain, left (2010), Pre-diabetes, and Vitamin D deficiency.  has a past surgical history that includes Cesarean section; Tonsillectomy; Hemorrhoid surgery; and Abdominal surgery.    Significant Hospital Events:  07/24/2021 - admit  On 07/25/2021: 1 unit of blood given because of drop in hemoglobin to 6 g% and platelet 1 unit given for a platelet count of 20,000.  Also crack cocaine found in the room ongoing Foley cath hypoxemia some improvement in mental status noted with lactulose.  Nevertheless had worsening jaundice. [place] -  1. There are numerable nodules seen throughout the liver consistent with the diffuse metastases seen on prior CT and PET-CT. Within the posterior aspect of the right hepatic lobe there is a lobular cystic lesion that is similar to 07/01/2021 prior. demonstrates a very small amount of abdominopelvic ascites. There is not enough fluid to warrant therapeutic paracentesis. Procedure not performed  On 07/26/21 - continuied agitated encephalopathy and ccm consulted. Precedex infusion started. OVernight cocaine  presumedf ound in her romm.  According to the daughter patient is upset that bad news and is in a frame of mind where she wants to give up.  They feel it is  stress reaction to bad news  07/26/21 - ccm consult. Rx precedex    Interim History / Subjective:   07/27/2021 -GI signed off.  Bilirubin slightly better.On precedex., Awaiting CT head.  Palliative care consult pending   Objective   Blood pressure (!) 93/54, pulse 86, temperature 98 F (36.7 C), temperature source Axillary, resp. rate (!) 23, SpO2 94 %.        Intake/Output Summary (Last 24 hours) at  07/27/2021 1236 Last data filed at 07/27/2021 1134 Gross per 24 hour  Intake 1430.71 ml  Output 590 ml  Net 840.71 ml   There were no vitals filed for this visit.  Examination: Extremely frail cachectic  malnourished female on a Retail [employer].  She is on Precedex and comfortable but occasionally she will get restless.  Here to auscultation abdomen soft.  RASS sedation score -3 equivalent on Precedex.  Normal heart sounds.  She is quite jaundiced.   Resolved Hospital Problem list   X  Assessment & Plan:   Metastaitc r metastasis confirmed as metastatic small cell lung  carcinoma 07/11/2021. - with liver nmets Acute hypoxemic respiratory failure mild - Present on Admit  07/27/2021 -> protecting airway but at risk for intubation due to agitated encephalopathy.  On nasal cannula oxygen  P:   Monitor closely Intubate if worse No longer a chemo candidate given liver injury liver injury     Opiate dependent cancer pain on MS Contin and oxycodone Substance abuse with cocaine and alcohol  Acute agitated encephalopathy -multifactorial metabolic reasons including alcohol withdrawal, cocaine toxicity, rule out intracranial lesion, worsening jaundice  - 07/27/2021 RASS sedation score between -3 and +1 and scheduled lactulose through nasogastric tube  P:   Precedex infusion to continue Lactulose scheduled to continue through NG tube Monitor RASS goal 0 to -2 CIWA Ativan protocol Consider clonidine protocol if needed Get CT head to rule out metastasis [too unstable for MRI] Monitor for opiate withdrawal with fentanyl as needed Check ammonia level 07/28/2021     Hypertensive baseline but at risk for circulatory shock - Prior to & Present on Admit   - 07/27/2021: Maintaining blood pressure on Precedex  P:  Mean arterial pressure greater than 65 Await echoiogram    Sinus tachycardia  07/27/2021 looks dry  P: Fluids and monitor according to the hospitalist   No clear-cut evidence of sepsis but at risk  P:   -Vancomycin 07/24/2021 - 07/25/2021 Zosyn 07/25/2021- - Cefepime 07/24/2021 - 07/25/2021      Acute kidney injury -present on admission and resolved by  07/24/2021  07/27/2021 -normal creatinine  P:  Monitor   Intermittent electrolyte abnormalities 07/27/2021: Severe hypokalemia corrected by [facility]  P: R replete and monitor per the [facility]     Liver metastasis - Prior to & Present on Admit Likely alcoholic cirrhosis - Prior to & Present on Admit Concern for ascites -only minimal present on admit UIS - Prior to & Present on Admit Possible associated hepatic encephalopathy - Present on Admit Liver metastasis - Prior to & Present on Admit Elevated ammonia level - Present on Admit   07/27/2021 -worsening ammonia.  Started lactulose 07/26/2021.  Bilirubin some better . GI signed off  P:   Continue lactulose Track LFT and ammonia 07/28/2021    Anemia in the setting of pancytopenia (baseline hemoglobin 10 g%] -admit hemoglobin 10.5, chronic disease, alcohol and chemo, status post 1 unit on 07/25/2021 for hemoglobin 6.4 g%  Leukopenia-new onset 07/24/2021 likely due to chemo [6000 on 07/21/2021]  Thrombocytopenia (baseline 108,000] -severe present on admission 25,000 status post 1 unit  07/27/2021: Severe pancytopenia persists but platelets still over 10,000   P:  - PRBC for hgb </= 6.9gm%    - exceptions are   -  if ACS susepcted/confirmed then transfuse for hgb </= 8.0gm%,  or    -  active bleeding with hemodynamic instability, then transfuse regardless of hemoglobin value   At at all times  try to transfuse 1 unit prbc as possible with exception of active hemorrhage  Platelets for bleeding or less than 10,000  Monitor white count    At risk  for hyperand hypoglycemia  P:   SSI per the hospitalist  MSK/DERM Failure to thrive - Prior to & Present on Admit ECOG 3 - Prior to & Present on Admit Poor mobility - Prior to & Present on Admit Substance abuse with cocaine and alcohol and  Opioid dependence due to cancer pain - Prior to & Present on [place] work consult [place]n consult No sacral decub at  No longer  chemo candidate   Best practice (daily eval):  Diet: npo, med via pand Pain/Anxiety/Delirium protocol (if indicated): see neur VAP protocol (if indicated): x DVT prophylaxis: scd GI prophylaxis: ppi Glucose control: ssi per [facility] Mobility: bed rest Code Status: full  Dispo: ICU  Family Communication: -  07/26/21: Discussed with the daughter-in-law and son-in-law with the bedside nurse present.  Discussion was in the bedside.  Daughter is aware patient has advanced cancer and the understanding that the cancer chemotherapy was not curative in intent.  Although she thought the prognosis was 50-50.  She did report that her mother is extremely upset that the bad news and is of the understanding that the cancer is terminal and she is depressed and reacting to the grief of the bad news.  She felt there is some hope that patient can get better.  She wants the patient to get better and going to get chemo again.  She also acknowledged understanding that improvement may not pan out.  She did support and align with the idea of concurrent comfort while we are trying to get the patient better.  I did express that we would monitor the patient and try to treat her and try to get her better.  The intent would be for her to get home and ultimately be in a position to get chemo.  We did discuss that in the event this was not possible what the neck steps would be.  For now continue full medical care but continuously update the daughter.  Daughter was open to meeting with palliative care to discuss various options around goals of care.  07/27/21: Palliative care consult called    ATTESTATION & SIGNATURE   The patient Guillermo Difrancesco is critically ill with multiple organ systems failure and requires high complexity decision making for assessment and support, frequent evaluation and titration of therapies, application of advanced monitoring technologies and extensive interpretation of multiple databases.   Critical  Care Time devoted to patient care services described in this note is  40  Minutes. This time reflects time of care of this signee Dr Brand Males. This critical care time does not reflect procedure time, or teaching time or supervisory time of PA/NP/Med student/Med Resident etc but could involve care discussion time     Dr. Brand Males, M.D., [facility].C.P Pulmonary and Critical Care Medicine Staff Physician [facility] Pager: [phone], If no answer or between  15:00h - 7:00h: call [phone]  07/27/2021 1:06 PM     SIGNATURE    Dr. Brand Males, M.D., F.C.C.P,  Pulmonary and Critical Care Medicine Staff Physician, [place] Director - Interstitial Lung Disease  Program  Pulmonary [place] at [place], [place], [address]  NPI Number:  NPI #[id number]  Pager: [phone]  [phone], If no answer  -> Check AMION or Try [phone] Telephone (clinical office): [phone] Telephone (research): [phone]  12:36 PM 07/27/2021   07/27/2021 12:36 PM    LABS    PULMONARY Recent Labs  Lab 07/24/21 1937  HCO3 18.3*  O2SAT 51.1    CBC Recent Labs  Lab 07/25/21 0713 07/25/21 1438 07/26/21 0253 07/27/21 0251  HGB 6.4* 9.3* 10.6* 8.1*  HCT 18.5* 25.8* 28.9* 22.3*  WBC 1.1*  --  1.5* 1.3*  PLT 22*  --  50* 38*    COAGULATION Recent Labs  Lab 07/24/21 1412 07/25/21 0253 07/26/21 0345 07/27/21 0251  INR 2.1* 2.4* 1.4* 1.4*    CARDIAC  No results for input(s): TROPONINI in the last 168 hours. No results for input(s): PROBNP in the last 168 hours.  CHEMISTRY Recent Labs  Lab 07/24/21 1412 07/24/21 2227 07/25/21 0253 07/26/21 0253 07/27/21 0251  NA 139 140 138 141 144  K 4.2 4.2 4.0 3.7 2.6*  CL 115* 113* 113* 113* 112*  CO2 18* 17* 16* 19* 23  GLUCOSE 92 84 79 136* 157*  BUN 38* 35* 36* 33* 29*  CREATININE 0.93 0.82 0.78 0.84 0.90   CALCIUM 7.9* 7.9* 7.6* 8.3* 8.0*  MG  --   --  2.0 1.7 2.1  PHOS  --   --  4.2 3.2 3.4   Estimated Creatinine Clearance: 48.5 mL/min (by C-G formula based on SCr of 0.9 mg/dL).   LIVER Recent Labs  Lab 07/24/21 1412 07/24/21 2227 07/25/21 0253 07/26/21 0253 07/26/21 0345 07/27/21 0251  AST 147* 110* 103* 89*  --  64*  ALT 83* 62* 60* 59*  --  50*  ALKPHOS 630* 458* 451* 421*  --  359*  BILITOT 16.1* 12.5* 13.3* 14.2*  --  11.6*  PROT 5.9* 5.4* 4.8* 5.5*  --  4.8*  ALBUMIN 2.3* 2.5* 2.0* 2.5*  --  2.1*  INR 2.1*  --  2.4*  --  1.4* 1.4*     INFECTIOUS Recent Labs  Lab 07/24/21 1623 07/25/21 0253 07/27/21 0251  LATICACIDVEN 1.5  --  1.5  PROCALCITON  --  2.55  --      ENDOCRINE CBG (last 3)  Recent Labs    07/27/21 0759 07/27/21 1146  GLUCAP 134* 113*         IMAGING x48h  - image(s) personally visualized  -   highlighted in bold DG Abd 1 View  Result Date: 07/26/2021 CLINICAL DATA:  Status post nasogastric tube placement. EXAM: ABDOMEN - 1 VIEW COMPARISON:  July 26, 2021 (5:04 p.m.) FINDINGS: The feeding tube seen on the prior study has been removed and has been replaced with a nasogastric tube. Its distal tip is seen overlying the expected region of the body of the stomach. The distal side hole sits along the gastroesophageal junction. The bowel gas pattern is normal. No radio-opaque calculi or other significant radiographic abnormality are seen. IMPRESSION: Nasogastric tube positioning, as described above. Electronically Signed   By: Virgina Norfolk M.D.   On: 07/26/2021 23:12   DG Abd 1 View  Result Date: 07/26/2021 CLINICAL DATA:  Feeding tube placement. EXAM: ABDOMEN - 1 VIEW COMPARISON:  CT, 07/01/2021. FINDINGS: Enteric feeding tube has its metallic tip in the proximal stomach, just below the GE junction. Normal bowel gas pattern. IMPRESSION: 1. Enteric feeding tube tip lies in the proximal stomach just below the GE junction. This will need to be  further  inserted to allow the tip to enter the duodenum by peristalsis. Recommend advancing 20 cm. Electronically Signed   By: Lajean Manes M.D.   On: 07/26/2021 17:40   [place] EKG SITE RITE  Result Date: 07/27/2021 If Site Rite image not attached, placement could not be confirmed due to current cardiac rhythm.

## 2021-07-27 NOTE — Progress Notes (Signed)
PROGRESS NOTE  Meredith Goodman BDZ:[id number] DOB: 07/28/1955 DOA: 07/24/2021 PCP: Pcp, No  HPI/Recap of past 24 hours:  Meredith Goodman is a 66 y.o. female with medical history significant for malignant carcinoid of the ileum, small cell lung cancer, recent diagnosis of liver metastasis postchemotherapy a week ago, essential hypertension, who presented to [facility] ED due to sudden onset shortness of breath of 1 day duration.    Work-up revealed hepatic encephalopathy, acute hypoxic respiratory failure secondary to right upper lobe pneumonia versus right upper lobe lung mass, jaundice with decompensated liver failure, coagulopathy.  Her prognosis is very poor therefore palliative care was consulted.    UDS positive for opiates and cocaine on 07/24/2021.  Hospital course complicated by agitation delirium for which she was started on Precedex drip.  Due to no good IV access.  PICC line was placed on 07/27/2021.  07/27/2021: Patient was seen and examined at her bedside.  She is resting peacefully while on Precedex.  Hypotensive this morning, started on midodrine via tube.    Assessment/Plan: Principal Problem:   Acute respiratory failure with hypoxia (HCC) Active Problems:   Protein-calorie malnutrition, severe  Persistent acute hypoxic respiratory failure secondary to lung mass versus infiltrate seen on CT scan Currently on 2 L from 4 L with O2 saturation 97% Not on oxygen supplementation at baseline. Maintain O2 saturation greater than 90% Wean off oxygen supplementation as tolerated when hemodynamically stable. Continue broad IV antibiotics coverage for now   Agitation delirium/persistent acute hepatic encephalopathy Elevated ammonia level on presentation, continue lactulose Goal BM 2 to 4/day Diagnostic paracentesis could not be performed by IR, not enough intra-abdominal fluid on 07/25/2021.   Due to agitation delirium and started on Precedex on 07/26/2021.  PCCM assisting with the  management.  Leukopenia/neutropenia Seen by medical oncology Continue empiric IV antibiotics Monitor fever curve and WBC.  Severe protein calorie malnutrition BMI 19 Severe muscle mass loss Minimally responsive NG tube feeding, dietitian consulted to initiate trickle feeds Aspiration precautions  Acute blood loss anemia, rule out intra-abdominal bleed Abdominal ultrasound ordered GI consulted Hemoglobin dropped to 6.0, transfused 1 unit on 07/25/2021, repeat hemoglobin 10.6 on 07/26/2021 Goal hemoglobin between 7 and 9 Hemoglobin dropped 8.1 on 07/27/2021 Continue to monitor H&H   Sepsis, suspect intra-abdominal source Leukopenia 1.5, tachycardia 124 IR paracentesis with fluid analysis, follow results Follow blood cultures and urine culture Received IV fluid in the ED Started on IV cefepime and IV vancomycin in the ED  MRSA screening test negative, IV vancomycin DC'd on 07/25/2018. Continue to monitor fever curve and WBC Maintain MAP greater than 65   Small abdominal ascites in the setting of recently diagnosed mets to the liver IR consulted for paracentesis diagnostic and therapeutic Rule out SBP, this could not be performed, not enough intra-abdominal fluid.   Elevated liver chemistries/jaundice in the setting of liver metastasis T bili 16, alkaline phosphatase, AST ALT elevated Continue to trend LFTs To bilirubin uptrending 14.2 from 13.3. Avoid hepatotoxic agents Continue lactulose Completed IV albumin 12.5 mg x 4 doses Avoid sedative agents GI following, appreciate assistance. T bilirubin is downtrending 11.6 from 14.2.   Treated coagulopathy in the setting of advanced liver disease INR 2.1> 1.4>1.4 Received a dose of IV vitamin K 5 mg.  Resolved uncontrolled hypertension Likely contributed by withdrawal BP normotensive since on Precedex drip.  Chronic thrombocytopenia in the setting of advanced liver disease Received 1 unit of platelet on 07/25/2021 for  platelet count of 22> 50>38K  Pancytopenia likely secondary to recent chemotherapy All 3 cell lines are down Seen by medical oncology.  Chronic pain syndrome Minimize use of narcotics and sedating agents. Closely monitor  Polysubstance abuse including cocaine and alcohol. TOC consulted to provide resources for polysubstance cessation CIWA protocol  Goals of care Palliative care team consulted to assist with establishing goals of care Very poor prognosis in the setting of advanced cancer, decompensated advanced liver disease, liver metastasis, ongoing intermittent alcohol use, drug use.   Critical care time: 65 minutes.     DVT prophylaxis: SCDs due to high risk of bleeding   Code Status: Full code per her daughter at bedside   Family Communication: Daughter at bedside   Disposition Plan: Admitted to stepdown unit   Consults called: Medical oncology, palliative care team, PCCM.   Admission status: Inpatient status     Status is: Inpatient   Patient requires at least 2 midnights for further evaluation and treatment of present condition.                Objective: Vitals:   07/27/21 0600 07/27/21 0700 07/27/21 0800 07/27/21 1200  BP: 98/62 (!) 93/54    Pulse: 85 86    Resp: (!) 31 (!) 23    Temp:   (!) 97.5 F (36.4 C) 98 F (36.7 C)  TempSrc:   Axillary Axillary  SpO2: 95% 94%      Intake/Output Summary (Last 24 hours) at 07/27/2021 1326 Last data filed at 07/27/2021 1134 Gross per 24 hour  Intake 1430.71 ml  Output 590 ml  Net 840.71 ml   There were no vitals filed for this visit.  Exam:  General: 66 y.o. year-old female f frail-appearing in no acute distress.  She is resting well on Precedex drip for agitation.   Cardiovascular: Regular rate and rhythm no rubs or gallops.   Respiratory: Mild rales with no wheezing noted.  Poor respiratory effort.   Abdomen: Mildly distended abdomen.  Bowel sounds present.   skin: No ulcerative lesions or  rashes. Psychiatry: Mood is appropriate for condition and setting. Neuro: Moves all 4 extremities freely. Musculoskeletal: 1+ pitting edema noted in lower extremities..   Data Reviewed: CBC: Recent Labs  Lab 07/24/21 1412 07/24/21 2227 07/25/21 0253 07/25/21 0713 07/25/21 1438 07/26/21 0253 07/27/21 0251  WBC 1.5* 1.3* 1.3* 1.1*  --  1.5* 1.3*  NEUTROABS 0.6* 0.4* 0.4*  --   --  0.3* 0.2*  HGB 8.7* 10.5* 7.1* 6.4* 9.3* 10.6* 8.1*  HCT 24.7* 29.3* 20.2* 18.5* 25.8* 28.9* 22.3*  MCV 86.7 85.9 87.8 88.9  --  83.3 84.2  PLT 32* 25* 23* 22*  --  50* 38*   Basic Metabolic Panel: Recent Labs  Lab 07/24/21 1412 07/24/21 2227 07/25/21 0253 07/26/21 0253 07/27/21 0251  NA 139 140 138 141 144  K 4.2 4.2 4.0 3.7 2.6*  CL 115* 113* 113* 113* 112*  CO2 18* 17* 16* 19* 23  GLUCOSE 92 84 79 136* 157*  BUN 38* 35* 36* 33* 29*  CREATININE 0.93 0.82 0.78 0.84 0.90  CALCIUM 7.9* 7.9* 7.6* 8.3* 8.0*  MG  --   --  2.0 1.7 2.1  PHOS  --   --  4.2 3.2 3.4   GFR: Estimated Creatinine Clearance: 48.5 mL/min (by C-G formula based on SCr of 0.9 mg/dL). Liver Function Tests: Recent Labs  Lab 07/24/21 1412 07/24/21 2227 07/25/21 0253 07/26/21 0253 07/27/21 0251  AST 147* 110* 103* 89* 64*  ALT 83*  62* 60* 59* 50*  ALKPHOS 630* 458* 451* 421* 359*  BILITOT 16.1* 12.5* 13.3* 14.2* 11.6*  PROT 5.9* 5.4* 4.8* 5.5* 4.8*  ALBUMIN 2.3* 2.5* 2.0* 2.5* 2.1*   No results for input(s): LIPASE, AMYLASE in the last 168 hours. Recent Labs  Lab 07/24/21 1412 07/25/21 0713  AMMONIA 57* 94*   Coagulation Profile: Recent Labs  Lab 07/24/21 1412 07/25/21 0253 07/26/21 0345 07/27/21 0251  INR 2.1* 2.4* 1.4* 1.4*   Cardiac Enzymes: Recent Labs  Lab 07/27/21 0251  CKTOTAL 80  CKMB 2.1   BNP (last 3 results) No results for input(s): PROBNP in the last 8760 hours. HbA1C: No results for input(s): HGBA1C in the last 72 hours. CBG: Recent Labs  Lab 07/27/21 0759 07/27/21 1146   GLUCAP 134* 113*   Lipid Profile: No results for input(s): CHOL, HDL, LDLCALC, TRIG, CHOLHDL, LDLDIRECT in the last 72 hours. Thyroid Function Tests: No results for input(s): TSH, T4TOTAL, FREET4, T3FREE, THYROIDAB in the last 72 hours. Anemia Panel: No results for input(s): VITAMINB12, FOLATE, FERRITIN, TIBC, IRON, RETICCTPCT in the last 72 hours. Urine analysis:    Component Value Date/Time   COLORURINE YELLOW 07/01/2021 [place] 07/01/2021 1613   LABSPEC 1.014 07/01/2021 1613   PHURINE 7.0 07/01/2021 1613   GLUCOSEU NEGATIVE 07/01/2021 1613   HGBUR SMALL (A) 07/01/2021 1613   BILIRUBINUR NEGATIVE 07/01/2021 1613   KETONESUR NEGATIVE 07/01/2021 1613   PROTEINUR NEGATIVE 07/01/2021 1613   UROBILINOGEN 0.2 12/26/2016 1259   NITRITE NEGATIVE 07/01/2021 1613   LEUKOCYTESUR NEGATIVE 07/01/2021 1613   Sepsis Labs: @LABRCNTIP (procalcitonin:4,lacticidven:4)  ) Recent Results (from the past 240 hour(s))  Urine Culture     Status: Abnormal   Collection Time: 07/24/21  1:49 PM   Specimen: In/Out Cath Urine  Result Value Ref Range Status   Specimen Description   Final    IN/OUT CATH URINE Performed at [facility], [place] [address], [place], [place] [address]    Special Requests   Final    Immunocompromised Performed at [facility], [place] [address] [place], [place] [address]    Culture MULTIPLE SPECIES PRESENT, SUGGEST RECOLLECTION (A)  Final   Report Status 07/26/2021 FINAL  Final  Blood Culture (routine x 2)     Status: None (Preliminary result)   Collection Time: 07/24/21  2:20 PM   Specimen: BLOOD  Result Value Ref Range Status   Specimen Description   Final    BLOOD BLOOD LEFT FOREARM Performed at [facility] [address] [place], [place] [address]    Special Requests   Final    BOTTLES DRAWN AEROBIC AND ANAEROBIC Blood Culture results may not be optimal due to an inadequate volume of blood  received in culture bottles Performed at [facility] [address] [place], [place] [address]    Culture   Final    NO GROWTH 2 DAYS Performed at [facility] [address], [place], [place] [address]    Report Status PENDING  Incomplete  Blood Culture (routine x 2)     Status: None (Preliminary result)   Collection Time: 07/24/21  2:25 PM   Specimen: BLOOD  Result Value Ref Range Status   Specimen Description   Final    BLOOD BLOOD RIGHT FOREARM Performed at [facility] [address] [place], [place] [address]    Special Requests   Final    BOTTLES DRAWN AEROBIC AND  ANAEROBIC Blood Culture results may not be optimal due to an inadequate volume of blood received in culture bottles Performed at [facility], [place] [address], [place], [place] [address]    Culture   Final    NO GROWTH 2 DAYS Performed at [facility] [address], [place], [place] [address]    Report Status PENDING  Incomplete  Resp Panel by RT-PCR (Flu A&B, Covid) Nasopharyngeal Swab     Status: None   Collection Time: 07/24/21  2:36 PM   Specimen: Nasopharyngeal Swab; Nasopharyngeal(NP) swabs in vial transport medium  Result Value Ref Range Status   SARS Coronavirus 2 by RT PCR NEGATIVE NEGATIVE Final    Comment: (NOTE) SARS-CoV-2 target nucleic acids are NOT DETECTED.  The SARS-CoV-2 RNA is generally detectable in upper respiratory specimens during the acute phase of infection. The lowest concentration of SARS-CoV-2 viral copies this assay can detect is 138 copies/mL. A negative result does not preclude SARS-Cov-2 infection and should not be used as the sole basis for treatment or other patient management decisions. A negative result may occur with  improper specimen collection/handling, submission of specimen other than nasopharyngeal swab, presence of viral mutation(s) within the areas targeted by this assay, and inadequate  number of viral copies(<138 copies/mL). A negative result must be combined with clinical observations, patient history, and epidemiological information. The expected result is Negative.  Fact Sheet for Patients:  EntrepreneurPulse.com.au  Fact Sheet for Healthcare Providers:  IncredibleEmployment.be  This test is no t yet approved or cleared by the [place] FDA and  has been authorized for detection and/or diagnosis of SARS-CoV-2 by FDA under an Emergency Use Authorization (EUA). This EUA will remain  in effect (meaning this test can be used) for the duration of the COVID-19 declaration under Section 564(b)(1) of the Act, 21 U.S.C.section 360bbb-3(b)(1), unless the authorization is terminated  or revoked sooner.       Influenza A by PCR NEGATIVE NEGATIVE Final   Influenza B by PCR NEGATIVE NEGATIVE Final    Comment: (NOTE) The Xpert Xpress SARS-CoV-2/FLU/RSV plus assay is intended as an aid in the diagnosis of influenza from Nasopharyngeal swab specimens and should not be used as a sole basis for treatment. Nasal washings and aspirates are unacceptable for Xpert Xpress SARS-CoV-2/FLU/RSV testing.  Fact Sheet for Patients: EntrepreneurPulse.com.au  Fact Sheet for Healthcare Providers: IncredibleEmployment.be  This test is not yet approved or cleared by the [place] FDA and has been authorized for detection and/or diagnosis of SARS-CoV-2 by FDA under an Emergency Use Authorization (EUA). This EUA will remain in effect (meaning this test can be used) for the duration of the COVID-19 declaration under Section 564(b)(1) of the Act, 21 U.S.C. section 360bbb-3(b)(1), unless the authorization is terminated or revoked.  Performed at [facility], [place] [address], [place], [place] [address]   MRSA Next Gen by PCR, Nasal     Status: None   Collection Time: 07/24/21  6:56 PM    Specimen: Nasal Mucosa; Nasal Swab  Result Value Ref Range Status   MRSA by PCR Next Gen NOT DETECTED NOT DETECTED Final    Comment: (NOTE) The GeneXpert MRSA Assay (FDA approved for NASAL specimens only), is one component of a comprehensive MRSA colonization surveillance program. It is not intended to diagnose MRSA infection nor to guide or monitor treatment for MRSA infections. Test performance is not FDA approved in patients less than 45 years old. Performed at [facility], [place] [address], [place], [place] [address]       Studies: DG Abd 1 View  Result Date: 07/26/2021 CLINICAL DATA:  Status post nasogastric tube placement. EXAM: ABDOMEN - 1 VIEW COMPARISON:  July 26, 2021 (5:04 p.m.) FINDINGS: The feeding tube seen on the prior study has been removed and has been replaced with a nasogastric tube. Its distal tip is seen overlying the expected region of the body of the stomach. The distal side hole sits along the gastroesophageal junction. The bowel gas pattern is normal. No radio-opaque calculi or other significant radiographic abnormality are seen. IMPRESSION: Nasogastric tube positioning, as described above. Electronically Signed   By: Virgina Norfolk M.D.   On: 07/26/2021 23:12   DG Abd 1 View  Result Date: 07/26/2021 CLINICAL DATA:  Feeding tube placement. EXAM: ABDOMEN - 1 VIEW COMPARISON:  CT, 07/01/2021. FINDINGS: Enteric feeding tube has its metallic tip in the proximal stomach, just below the GE junction. Normal bowel gas pattern. IMPRESSION: 1. Enteric feeding tube tip lies in the proximal stomach just below the GE junction. This will need to be further inserted to allow the tip to enter the duodenum by peristalsis. Recommend advancing 20 cm. Electronically Signed   By: Lajean Manes M.D.   On: 07/26/2021 17:40   [place] EKG SITE RITE  Result Date: 07/27/2021 If Site Rite image not attached, placement could not be confirmed due to current cardiac rhythm.    Scheduled Meds:  Chlorhexidine Gluconate Cloth  6 each Topical Daily   feeding supplement  237 mL Oral TID BM   [START ON 2/70/3500] folic acid  1 mg Per Tube Daily   insulin aspart  0-9 Units Subcutaneous Q4H   lactulose  30 g Per Tube BID   mouth rinse  15 mL Mouth Rinse BID   mouth rinse  15 mL Mouth Rinse BID   midodrine  10 mg Per Tube TID WC   [START ON 07/28/2021] multivitamin with minerals  1 tablet Per Tube Daily   pantoprazole (PROTONIX) IV  40 mg Intravenous Q24H   potassium chloride  40 mEq Per Tube BID   sodium chloride flush  10-40 mL Intracatheter Q12H   [START ON 07/28/2021] thiamine  100 mg Per Tube Daily   Or   [START ON 07/28/2021] thiamine  100 mg Intravenous Daily    Continuous Infusions:  sodium chloride 20 mL/hr at 07/27/21 1134   dexmedetomidine (PRECEDEX) IV infusion 0.2 mcg/kg/hr (07/27/21 1134)   dextrose 5 % and 0.45% NaCl Stopped (07/27/21 0923)   piperacillin-tazobactam (ZOSYN)  IV Stopped (07/27/21 0926)   potassium chloride 10 mEq (07/27/21 1224)     LOS: 3 days     Kayleen Memos, MD [facility] Pager [phone]  If 7PM-7AM, please contact night-coverage [email or web address] Password [email or web address] 07/27/2021, 1:26 PM

## 2021-07-27 NOTE — Progress Notes (Signed)
Nutrition Follow-up  DOCUMENTATION CODES:   Severe malnutrition in context of chronic illness  INTERVENTION:   -D/c Ensure Enlive -Initiate Osmolite 1.5 @ 20 ml/hr via NGT and increase by 10 ml every 12 hours to goal rate of 55 ml/hr.   45 ml Prosource TF daily.  175 ml free water flush every 6 hours    Tube feeding regimen provides 1840 kcal (100% of needs), 86 grams of protein, and 914 ml of H2O.  Total free water: 1614 ml daily  -Monitor Mg, K, and Phos daily and replete as needed due to high refeeding risk  NUTRITION DIAGNOSIS:   Severe Malnutrition related to chronic illness, cancer and cancer related treatments as evidenced by severe fat depletion, severe muscle depletion.  Ongoing  GOAL:   Patient will meet greater than or equal to 90% of their needs  Progressing   MONITOR:   PO intake, Supplement acceptance, Labs, Weight trends  REASON FOR ASSESSMENT:   Malnutrition Screening Tool    ASSESSMENT:   66 y.o. female with medical history of malignant carcinoid of the ileum, small cell lung cancer, recent dx of liver metastasis, undergoing chemo, DM, hypercholesteremia, hemorrhoids, vitamin D deficiency, arthritis, GERD, pre-diabetes vs DM, and essential HTN. She presented to the ED due to sudden onset of shortness of breath x1 day. She presented to the ED via EMS from home.  1/14- NGT placed, placement verified by x-ray, started on precedex  Reviewed I/O's: -153 ml x 24 hours and -551 ml since admission  UOP: 1.2 L x 24 hours  Per MD notes, pt awaiting CT of head.   Pt on precedex secondary to agitation delirium. She is minimally responsive. RD consulted to start tube feedings.   Palliative care following for goals of care discussions.    Medications reviewed and include folic acid, lactulose, thiamine, and precedex  Labs reviewed: K: 2.6 (on supplementation), CBGS: 113-134 (inpatient orders for glycemic control are 0-9 units insulin aspart every 4 hours).     Diet Order:   Diet Order             Diet NPO time specified  Diet effective now                   EDUCATION NEEDS:   No education needs have been identified at this time  Skin:  Skin Assessment: Skin Integrity Issues: Skin Integrity Issues:: Other (Comment) Other: puncture wound to abdomen  Last BM:  07/26/21 (via rectal tube)  Height:   Ht Readings from Last 1 Encounters:  07/17/21 5\' 2"  (1.575 m)    Weight:   Wt Readings from Last 1 Encounters:  07/21/21 48.6 kg    Ideal Body Weight:  50 kg  BMI:  There is no height or weight on file to calculate BMI.  Estimated Nutritional Needs:   Kcal:  1700-1950 kcal  Protein:  85-100 grams  Fluid:  >/= 1.6 L/day    Loistine Chance, RD, LDN, [place] Registered Dietitian II Certified Diabetes Care and Education Specialist Please refer to [facility] for RD and/or RD on-call/weekend/after hours pager

## 2021-07-27 NOTE — Progress Notes (Signed)
Nurse reached out to provider due to patient having soft pressures. Sedation (Dex) is down to 0.2. No new orders at this Time. Will continue to monitor.

## 2021-07-27 NOTE — Progress Notes (Signed)
Nurse notified provider Kennon Holter, NP at [phone] about patient's clinical lab values. K+ 2.6 and wbc 1.3. See MAR for intervention. Will continue to monitor.

## 2021-07-27 NOTE — Progress Notes (Signed)
° °  Patient Name: Meredith Goodman Date of Encounter: 07/27/2021, 11:10 AM    Subjective  Started on Precedex overnight.  She is sedated at this time.   Objective  BP (!) 93/54    Pulse 86    Temp (!) 97.5 F (36.4 C) (Axillary)    Resp (!) 23    SpO2 94%  Sedated chronically ill black woman Abdomen soft nontender  Recent Labs  Lab 07/24/21 1412 07/24/21 2227 07/25/21 0253 07/26/21 0253 07/26/21 0345 07/27/21 0251  AST 147* 110* 103* 89*  --  64*  ALT 83* 62* 60* 59*  --  50*  ALKPHOS 630* 458* 451* 421*  --  359*  BILITOT 16.1* 12.5* 13.3* 14.2*  --  11.6*  PROT 5.9* 5.4* 4.8* 5.5*  --  4.8*  ALBUMIN 2.3* 2.5* 2.0* 2.5*  --  2.1*  INR 2.1*  --  2.4*  --  1.4* 1.4*   Recent Labs  Lab 07/25/21 0713 07/25/21 1438 07/26/21 0253 07/27/21 0251  HGB 6.4* 9.3* 10.6* 8.1*  HCT 18.5* 25.8* 28.9* 22.3*  WBC 1.1*  --  1.5* 1.3*  PLT 22*  --  50* 38*   Recent Labs  Lab 07/24/21 1412 07/24/21 2227 07/25/21 0253 07/26/21 0253 07/27/21 0251  NA 139 140 138 141 144  K 4.2 4.2 4.0 3.7 2.6*  CL 115* 113* 113* 113* 112*  CO2 18* 17* 16* 19* 23  GLUCOSE 92 84 79 136* 157*  BUN 38* 35* 36* 33* 29*  CREATININE 0.93 0.82 0.78 0.84 0.90  CALCIUM 7.9* 7.9* 7.6* 8.3* 8.0*  MG  --   --  2.0 1.7 2.1  PHOS  --   --  4.2 3.2 3.4      Assessment and Plan  Acute liver failure with diffuse metastatic small cell cancer and liver also suspected to be in lung Pancytopenia improved Hepatic encephalopathy Ascites Recent and ongoing substance abuse-cocaine   Required sedation with Precedex.  Probably multifactorial encephalopathy including liver failure.  Biochemically continues to improve with the exception of drift of hemoglobin and hypokalemia today.  I do not have anything more to add to her management.  Signing off please call us back if questions.  Gatha Mayer, MD, [facility] 07/27/2021 11:10 AM

## 2021-07-28 ENCOUNTER — Other Ambulatory Visit: Payer: Self-pay

## 2021-07-28 ENCOUNTER — Other Ambulatory Visit: Payer: Managed Care, Other (non HMO)

## 2021-07-28 ENCOUNTER — Ambulatory Visit: Payer: Managed Care, Other (non HMO) | Admitting: Hematology

## 2021-07-28 ENCOUNTER — Ambulatory Visit: Payer: Self-pay | Admitting: Hematology

## 2021-07-28 DIAGNOSIS — Z7189 Other specified counseling: Secondary | ICD-10-CM

## 2021-07-28 DIAGNOSIS — Z515 Encounter for palliative care: Secondary | ICD-10-CM

## 2021-07-28 DIAGNOSIS — C787 Secondary malignant neoplasm of liver and intrahepatic bile duct: Secondary | ICD-10-CM | POA: Diagnosis not present

## 2021-07-28 DIAGNOSIS — N179 Acute kidney failure, unspecified: Secondary | ICD-10-CM

## 2021-07-28 DIAGNOSIS — R64 Cachexia: Secondary | ICD-10-CM

## 2021-07-28 DIAGNOSIS — G9341 Metabolic encephalopathy: Secondary | ICD-10-CM

## 2021-07-28 LAB — DIFFERENTIAL
Abs Immature Granulocytes: 0 10*3/uL (ref 0.00–0.07)
Band Neutrophils: 1 %
Basophils Absolute: 0 10*3/uL (ref 0.0–0.1)
Basophils Relative: 1 %
Eosinophils Absolute: 0 10*3/uL (ref 0.0–0.5)
Eosinophils Relative: 1 %
Lymphocytes Relative: 47 %
Lymphs Abs: 1.6 10*3/uL (ref 0.7–4.0)
Metamyelocytes Relative: 1 %
Monocytes Absolute: 0.6 10*3/uL (ref 0.1–1.0)
Monocytes Relative: 19 %
Neutro Abs: 1.1 10*3/uL — ABNORMAL LOW (ref 1.7–7.7)
Neutrophils Relative %: 30 %
nRBC: 43 /100 WBC — ABNORMAL HIGH

## 2021-07-28 LAB — GLUCOSE, CAPILLARY
Glucose-Capillary: 108 mg/dL — ABNORMAL HIGH (ref 70–99)
Glucose-Capillary: 144 mg/dL — ABNORMAL HIGH (ref 70–99)
Glucose-Capillary: 147 mg/dL — ABNORMAL HIGH (ref 70–99)
Glucose-Capillary: 149 mg/dL — ABNORMAL HIGH (ref 70–99)
Glucose-Capillary: 155 mg/dL — ABNORMAL HIGH (ref 70–99)
Glucose-Capillary: 156 mg/dL — ABNORMAL HIGH (ref 70–99)
Glucose-Capillary: 178 mg/dL — ABNORMAL HIGH (ref 70–99)

## 2021-07-28 LAB — COMPREHENSIVE METABOLIC PANEL
ALT: 56 U/L — ABNORMAL HIGH (ref 0–44)
AST: 89 U/L — ABNORMAL HIGH (ref 15–41)
Albumin: 2.1 g/dL — ABNORMAL LOW (ref 3.5–5.0)
Alkaline Phosphatase: 465 U/L — ABNORMAL HIGH (ref 38–126)
Anion gap: 8 (ref 5–15)
BUN: 38 mg/dL — ABNORMAL HIGH (ref 8–23)
CO2: 20 mmol/L — ABNORMAL LOW (ref 22–32)
Calcium: 8.1 mg/dL — ABNORMAL LOW (ref 8.9–10.3)
Chloride: 119 mmol/L — ABNORMAL HIGH (ref 98–111)
Creatinine, Ser: 1.23 mg/dL — ABNORMAL HIGH (ref 0.44–1.00)
GFR, Estimated: 49 mL/min — ABNORMAL LOW (ref >60)
Glucose, Bld: 158 mg/dL — ABNORMAL HIGH (ref 70–99)
Potassium: 4.2 mmol/L (ref 3.5–5.1)
Sodium: 147 mmol/L — ABNORMAL HIGH (ref 135–145)
Total Bilirubin: 11 mg/dL — ABNORMAL HIGH (ref 0.3–1.2)
Total Protein: 4.8 g/dL — ABNORMAL LOW (ref 6.5–8.1)

## 2021-07-28 LAB — BPAM RBC
Blood Product Expiration Date: 202302132359
ISSUE DATE / TIME: 202301130908
Unit Type and Rh: 5100

## 2021-07-28 LAB — TYPE AND SCREEN
ABO/RH(D): O POS
Antibody Screen: NEGATIVE
Unit division: 0

## 2021-07-28 LAB — PREPARE PLATELET PHERESIS: Unit division: 0

## 2021-07-28 LAB — MAGNESIUM: Magnesium: 2.2 mg/dL (ref 1.7–2.4)

## 2021-07-28 LAB — CBC
HCT: 23.2 % — ABNORMAL LOW (ref 36.0–46.0)
Hemoglobin: 8.3 g/dL — ABNORMAL LOW (ref 12.0–15.0)
MCH: 30.5 pg (ref 26.0–34.0)
MCHC: 35.8 g/dL (ref 30.0–36.0)
MCV: 85.3 fL (ref 80.0–100.0)
Platelets: 53 10*3/uL — ABNORMAL LOW (ref 150–400)
RBC: 2.72 MIL/uL — ABNORMAL LOW (ref 3.87–5.11)
RDW: 19.1 % — ABNORMAL HIGH (ref 11.5–15.5)
WBC: 3.4 10*3/uL — ABNORMAL LOW (ref 4.0–10.5)
nRBC: 43 % — ABNORMAL HIGH (ref 0.0–0.2)

## 2021-07-28 LAB — HEMOGLOBIN A1C
Hgb A1c MFr Bld: 6.6 % — ABNORMAL HIGH (ref 4.8–5.6)
Mean Plasma Glucose: 142.72 mg/dL

## 2021-07-28 LAB — AMMONIA: Ammonia: 97 umol/L — ABNORMAL HIGH (ref 9–35)

## 2021-07-28 LAB — PROTIME-INR
INR: 1.5 — ABNORMAL HIGH (ref 0.8–1.2)
Prothrombin Time: 18.4 seconds — ABNORMAL HIGH (ref 11.4–15.2)

## 2021-07-28 LAB — PHOSPHORUS: Phosphorus: 2.5 mg/dL (ref 2.5–4.6)

## 2021-07-28 LAB — BPAM PLATELET PHERESIS
Blood Product Expiration Date: 202301152359
ISSUE DATE / TIME: 202301131142
Unit Type and Rh: 5100

## 2021-07-28 MED ORDER — FREE WATER
200.0000 mL | Status: DC
  Administered 2021-07-28 – 2021-07-29 (×7): 200 mL

## 2021-07-28 MED ORDER — OXYCODONE HCL 5 MG PO TABS
5.0000 mg | ORAL_TABLET | Freq: Four times a day (QID) | ORAL | Status: DC | PRN
  Administered 2021-07-29 (×2): 5 mg
  Filled 2021-07-28 (×2): qty 1

## 2021-07-28 MED ORDER — LORAZEPAM 2 MG/ML IJ SOLN: 1.0000 mg | INTRAMUSCULAR | Status: AC | PRN

## 2021-07-28 MED ORDER — SODIUM CHLORIDE 0.9 % IV BOLUS
1000.0000 mL | Freq: Once | INTRAVENOUS | Status: AC
  Administered 2021-07-28: 1000 mL via INTRAVENOUS

## 2021-07-28 MED ORDER — ALBUMIN HUMAN 5 % IV SOLN
12.5000 g | Freq: Once | INTRAVENOUS | Status: AC
  Administered 2021-07-28: 12.5 g via INTRAVENOUS
  Filled 2021-07-28: qty 250

## 2021-07-28 MED ORDER — LORAZEPAM 1 MG PO TABS: 1.0000 mg | ORAL_TABLET | ORAL | Status: AC | PRN

## 2021-07-28 MED ORDER — PANTOPRAZOLE 2 MG/ML SUSPENSION
40.0000 mg | Freq: Every day | ORAL | Status: DC
  Administered 2021-07-28 – 2021-07-29 (×2): 40 mg
  Filled 2021-07-28 (×2): qty 20

## 2021-07-28 NOTE — Progress Notes (Signed)
[facility] Progress Note Patient Name: Meredith Goodman DOB: 01-Jul-1956 MRN: [id number]   Date of Service  07/28/2021  HPI/Events of Note  Hypotension - MAP = 58/64 post albumin. LVEF = 60-65%.  eICU Interventions  Plan: Bolus with 0.9 NaCl 1 liter IV over 1 hour now.  Monitor CVP now and Q 4 hours.      Intervention Category Major Interventions: Hypotension - evaluation and management  [name] 07/28/2021, 2:24 AM

## 2021-07-28 NOTE — TOC Initial Note (Signed)
Transition of Care [facility]) - Initial/Assessment Note    Patient Details  Name: Meredith Goodman MRN: [id number] Date of Birth: October 06, 1955  Transition of Care [facility]) CM/SW Contact:    Leeroy Cha, RN Phone Number: 07/28/2021, 8:20 AM  Clinical Narrative:                  Transition of Care [facility]) Screening Note   Patient Details  Name: Meredith Goodman Date of Birth: 01/01/1956   Transition of Care [facility]) CM/SW Contact:    Leeroy Cha, RN Phone Number: 07/28/2021, 8:20 AM    Transition of Care Department [facility]) has reviewed patient and no TOC needs have been identified at this time. We will continue to monitor patient advancement through interdisciplinary progression rounds. If new patient transition needs arise, please place a TOC consult.    Expected Discharge Plan: Home w Hospice Care Barriers to Discharge: Continued Medical Work up   Patient Goals and CMS Choice Patient states their goals for this hospitalization and ongoing recovery are:: unable to state sedated      Expected Discharge Plan and Services Expected Discharge Plan: [place]   Discharge Planning Services: CM Consult   Living arrangements for the past 2 months: Apartment                                      Prior Living Arrangements/Services Living arrangements for the past 2 months: Apartment Lives with:: Self Patient language and need for interpreter reviewed:: Yes              Criminal Activity/Legal Involvement Pertinent to Current Situation/Hospitalization: No - Comment as needed  Activities of Daily Living Home Assistive Devices/Equipment: None ADL Screening (condition at time of admission) Patient's cognitive ability adequate to safely complete daily activities?: No Is the patient deaf or have difficulty hearing?: No Does the patient have difficulty seeing, even when wearing glasses/contacts?: No Does the patient have difficulty concentrating, remembering, or  making decisions?: No Patient able to express need for assistance with ADLs?: Yes Does the patient have difficulty dressing or bathing?: Yes Independently performs ADLs?: No Does the patient have difficulty walking or climbing stairs?: No Weakness of Legs: Both Weakness of Arms/Hands: Both  Permission Sought/Granted                  Emotional Assessment Appearance:: Appears stated age Attitude/Demeanor/Rapport: Unable to Assess Affect (typically observed): Unable to Assess Orientation: : Fluctuating Orientation (Suspected and/or reported Sundowners) Alcohol / Substance Use: Not Applicable Psych Involvement: No (comment)  Admission diagnosis:  Shortness of breath [R06.02] Acute respiratory failure with hypoxia (HCC) [J96.01] Abdominal ascites [R18.8] Patient Active Problem List   Diagnosis Date Noted   Protein-calorie malnutrition, severe 07/25/2021   Acute respiratory failure with hypoxia ([place]) 07/24/2021   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[place]), stage IV 07/16/2021   Liver metastases ([place])    Hyperlipidemia 11/28/2019   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

## 2021-07-28 NOTE — Progress Notes (Signed)
PROGRESS NOTE  Meredith Goodman HWT:[id number] DOB: Mar 04, 1956 DOA: 07/24/2021 PCP: Pcp, No  HPI/Recap of past 24 hours: Meredith Goodman is a 66 y.o. female with medical history significant for malignant carcinoid of the ileum, small cell lung cancer, recent diagnosis of liver metastasis postchemotherapy a week ago, essential hypertension, who presented to [facility] ED due to sudden onset shortness of breath of 1 day duration.    Work-up revealed hepatic encephalopathy, acute hypoxic respiratory failure secondary to right upper lobe pneumonia versus right upper lobe lung mass, jaundice with decompensated liver failure, coagulopathy.  Seen by medical oncology and GI.  Very poor prognosis, therefore palliative care was consulted.    UDS positive for opiates and cocaine on 07/24/2021.  Hospital course complicated by agitation delirium for which she was started on Precedex drip.  PICC line and NG tube placed on 07/27/2021.  07/28/2021: Seen and examined at her bedside.  Her daughter was present in the room.  She is sedated on Precedex drip.    Assessment/Plan: Principal Problem:   Acute respiratory failure with hypoxia (HCC) Active Problems:   Protein-calorie malnutrition, severe  Persistent acute hypoxic respiratory failure secondary to lung mass versus infiltrate seen on CT scan Currently on 2 L from 4 L with O2 saturation 97% Not on oxygen supplementation at baseline. Maintain O2 saturation greater than 90% Wean off oxygen supplementation as tolerated when hemodynamically stable. Continue broad spectrum IV antibiotics coverage for now   Agitation delirium/persistent acute hepatic encephalopathy, sedated on Precedex drip. Elevated ammonia level on presentation, continue lactulose Goal BM 2 to 4/day Diagnostic paracentesis could not be performed by IR, not enough intra-abdominal fluid on 07/25/2021.   Due to agitation delirium and started on Precedex on 07/26/2021.  PCCM assisting with the  management. Had 3 BMs on 07/27/2021.  Improving leukopenia/neutropenia Seen by medical oncology WBC, neutrophil count improved Continue empiric IV antibiotics Monitor fever curve and WBC.  Severe protein calorie malnutrition BMI 19 Severe muscle mass loss Minimally responsive NG tube feeding, dietitian consulted to initiate trickle feeds Continue aspiration precautions  Acute blood loss anemia, rule out intra-abdominal bleed Abdominal ultrasound ordered GI consulted Hemoglobin dropped to 6.0, transfused 1 unit on 07/25/2021, repeat hemoglobin 10.6 on 07/26/2021 Hemoglobin 8.3, from 8.1. Goal hemoglobin between 7 and 9 Hemoglobin dropped 8.1 on 07/27/2021 Continue to monitor H&H   Sepsis, suspect intra-abdominal source Leukopenia 1.5, tachycardia 124 IR paracentesis with fluid analysis, follow results Follow blood cultures and urine culture Received IV fluid in the ED Started on IV cefepime and IV vancomycin in the ED  MRSA screening test negative, IV vancomycin DC'd on 07/25/2018. Continue to monitor fever curve and WBC Maintain MAP greater than 65   Small abdominal ascites in the setting of recently diagnosed mets to the liver IR consulted for paracentesis diagnostic and therapeutic Rule out SBP, this could not be performed, not enough intra-abdominal fluid.   Elevated liver chemistries/jaundice in the setting of liver metastasis T bili 16, alkaline phosphatase, AST ALT elevated Continue to trend LFTs To bilirubin uptrending 14.2 from 13.3. Avoid hepatotoxic agents Continue lactulose Completed IV albumin 12.5 mg x 4 doses Avoid sedative agents GI following, appreciate assistance. T bilirubin is downtrending 11.6 from 14.2.   Treated coagulopathy in the setting of advanced liver disease INR 2.1> 1.4>1.4> 1.5. Received a dose of IV vitamin K 5 mg.  Resolved uncontrolled hypertension Likely contributed by withdrawal BP normotensive since on Precedex drip.  Chronic  thrombocytopenia in the setting of advanced liver  disease Received 1 unit of platelet on 07/25/2021 for platelet count of 22> 50>38K   Improving pancytopenia likely secondary to recent chemotherapy All 3 cell lines are down and up trending. Seen by medical oncology.  Chronic pain syndrome Minimize use of narcotics and sedating agents. Closely monitor  Polysubstance abuse including cocaine and alcohol. TOC consulted to provide resources for polysubstance cessation CIWA protocol  Goals of care Palliative care team consulted to assist with establishing goals of care Very poor prognosis in the setting of advanced cancer, decompensated advanced liver disease, liver metastasis, ongoing intermittent alcohol use, drug use.   Critical care time: 55 minutes.     DVT prophylaxis: SCDs due to high risk of bleeding   Code Status: Full code per her daughter at bedside   Family Communication: Daughter is at bedside.  Updated.   Disposition Plan: Admitted to stepdown unit   Consults called: Medical oncology, palliative care team, PCCM.   Admission status: Inpatient status     Status is: Inpatient   Patient requires at least 2 midnights for further evaluation and treatment of present condition.                Objective: Vitals:   07/28/21 1000 07/28/21 1100 07/28/21 1107 07/28/21 1200  BP: (!) 105/53 (!) 105/54  110/65  Pulse: 64 62 63 73  Resp: (!) 23 (!) 23 (!) 22 18  Temp:      TempSrc:      SpO2: 99% 96% 97% 97%  Weight:        Intake/Output Summary (Last 24 hours) at 07/28/2021 1303 Last data filed at 07/28/2021 0721 Gross per 24 hour  Intake 1900.28 ml  Output 1700 ml  Net 200.28 ml   Filed Weights   07/24/21 1900 07/28/21 0500  Weight: 48.3 kg 49.6 kg    Exam:  General: 66 y.o. year-old female frail-appearing sedated on Precedex drip.   Cardiovascular: Regular rate and rhythm no rubs or gallops.   Respiratory: Mild rales at bases no wheezing noted.   Poor inspiratory effort. Abdomen: Distended bowel sounds present.   Skin: No ulcerative lesions or rashes. Psychiatry: Mood is appropriate for condition and setting. Neuro: Unable to assess due to sedation. Musculoskeletal: No lower extremity edema bilaterally.  Data Reviewed: CBC: Recent Labs  Lab 07/24/21 2227 07/25/21 0253 07/25/21 0713 07/25/21 1438 07/26/21 0253 07/27/21 0251 07/28/21 0008  WBC 1.3* 1.3* 1.1*  --  1.5* 1.3* 3.4*  NEUTROABS 0.4* 0.4*  --   --  0.3* 0.2* 1.1*  HGB 10.5* 7.1* 6.4* 9.3* 10.6* 8.1* 8.3*  HCT 29.3* 20.2* 18.5* 25.8* 28.9* 22.3* 23.2*  MCV 85.9 87.8 88.9  --  83.3 84.2 85.3  PLT 25* 23* 22*  --  50* 38* 53*   Basic Metabolic Panel: Recent Labs  Lab 07/24/21 2227 07/25/21 0253 07/26/21 0253 07/27/21 0251 07/28/21 0327  NA 140 138 141 144 147*  K 4.2 4.0 3.7 2.6* 4.2  CL 113* 113* 113* 112* 119*  CO2 17* 16* 19* 23 20*  GLUCOSE 84 79 136* 157* 158*  BUN 35* 36* 33* 29* 38*  CREATININE 0.82 0.78 0.84 0.90 1.23*  CALCIUM 7.9* 7.6* 8.3* 8.0* 8.1*  MG  --  2.0 1.7 2.1 2.2  PHOS  --  4.2 3.2 3.4 2.5   GFR: Estimated Creatinine Clearance: 36.2 mL/min (A) (by C-G formula based on SCr of 1.23 mg/dL (H)). Liver Function Tests: Recent Labs  Lab 07/24/21 2227 07/25/21 0253 07/26/21 0253 07/27/21  0251 07/28/21 0327  AST 110* 103* 89* 64* 89*  ALT 62* 60* 59* 50* 56*  ALKPHOS 458* 451* 421* 359* 465*  BILITOT 12.5* 13.3* 14.2* 11.6* 11.0*  PROT 5.4* 4.8* 5.5* 4.8* 4.8*  ALBUMIN 2.5* 2.0* 2.5* 2.1* 2.1*   No results for input(s): LIPASE, AMYLASE in the last 168 hours. Recent Labs  Lab 07/24/21 1412 07/25/21 0713 07/28/21 0327  AMMONIA 57* 94* 97*   Coagulation Profile: Recent Labs  Lab 07/24/21 1412 07/25/21 0253 07/26/21 0345 07/27/21 0251 07/28/21 0327  INR 2.1* 2.4* 1.4* 1.4* 1.5*   Cardiac Enzymes: Recent Labs  Lab 07/27/21 0251  CKTOTAL 80  CKMB 2.1   BNP (last 3 results) No results for input(s): PROBNP in the  last 8760 hours. HbA1C: Recent Labs    07/28/21 0327  HGBA1C 6.6*   CBG: Recent Labs  Lab 07/27/21 1957 07/28/21 0004 07/28/21 0322 07/28/21 0819 07/28/21 1207  GLUCAP 133* 155* 149* 147* 156*   Lipid Profile: No results for input(s): CHOL, HDL, LDLCALC, TRIG, CHOLHDL, LDLDIRECT in the last 72 hours. Thyroid Function Tests: No results for input(s): TSH, T4TOTAL, FREET4, T3FREE, THYROIDAB in the last 72 hours. Anemia Panel: No results for input(s): VITAMINB12, FOLATE, FERRITIN, TIBC, IRON, RETICCTPCT in the last 72 hours. Urine analysis:    Component Value Date/Time   COLORURINE YELLOW 07/01/2021 [place] 07/01/2021 1613   LABSPEC 1.014 07/01/2021 1613   PHURINE 7.0 07/01/2021 1613   GLUCOSEU NEGATIVE 07/01/2021 1613   HGBUR SMALL (A) 07/01/2021 1613   BILIRUBINUR NEGATIVE 07/01/2021 1613   KETONESUR NEGATIVE 07/01/2021 1613   PROTEINUR NEGATIVE 07/01/2021 1613   UROBILINOGEN 0.2 12/26/2016 1259   NITRITE NEGATIVE 07/01/2021 1613   LEUKOCYTESUR NEGATIVE 07/01/2021 1613   Sepsis Labs: @LABRCNTIP (procalcitonin:4,lacticidven:4)  ) Recent Results (from the past 240 hour(s))  Urine Culture     Status: Abnormal   Collection Time: 07/24/21  1:49 PM   Specimen: In/Out Cath Urine  Result Value Ref Range Status   Specimen Description   Final    IN/OUT CATH URINE Performed at [facility], [place] [address], [place], [place] [address]    Special Requests   Final    Immunocompromised Performed at [facility], [place] [address], [place], [place] [address]    Culture MULTIPLE SPECIES PRESENT, SUGGEST RECOLLECTION (A)  Final   Report Status 07/26/2021 FINAL  Final  Blood Culture (routine x 2)     Status: None (Preliminary result)   Collection Time: 07/24/21  2:20 PM   Specimen: BLOOD  Result Value Ref Range Status   Specimen Description   Final    BLOOD BLOOD LEFT FOREARM Performed at [place] [address], [place], [place] [address]    Special Requests   Final    BOTTLES DRAWN AEROBIC AND ANAEROBIC Blood Culture results may not be optimal due to an inadequate volume of blood received in culture bottles Performed at [place] [address] [place], [place] [address]    Culture   Final    NO GROWTH 4 DAYS Performed at [facility], [place] [address] [place], [place] [address]    Report Status PENDING  Incomplete  Blood Culture (routine x 2)     Status: None (Preliminary result)   Collection Time: 07/24/21  2:25 PM   Specimen: BLOOD  Result Value Ref Range Status   Specimen Description   Final    BLOOD BLOOD  RIGHT FOREARM Performed at [facility], [place] [address], [place], [place] [address]    Special Requests   Final    BOTTLES DRAWN AEROBIC AND ANAEROBIC Blood Culture results may not be optimal due to an inadequate volume of blood received in culture bottles Performed at [place] [address] [place], [place] [address]    Culture   Final    NO GROWTH 4 DAYS Performed at [facility], [place] [address], [place], [place] [address]    Report Status PENDING  Incomplete  Resp Panel by RT-PCR (Flu A&B, Covid) Nasopharyngeal Swab     Status: None   Collection Time: 07/24/21  2:36 PM   Specimen: Nasopharyngeal Swab; Nasopharyngeal(NP) swabs in vial transport medium  Result Value Ref Range Status   SARS Coronavirus 2 by RT PCR NEGATIVE NEGATIVE Final    Comment: (NOTE) SARS-CoV-2 target nucleic acids are NOT DETECTED.  The SARS-CoV-2 RNA is generally detectable in upper respiratory specimens during the acute phase of infection. The lowest concentration of SARS-CoV-2 viral copies this assay can detect is 138 copies/mL. A negative result does not preclude SARS-Cov-2 infection and should not be used as the sole basis for treatment or other patient management decisions. A negative result may occur  with  improper specimen collection/handling, submission of specimen other than nasopharyngeal swab, presence of viral mutation(s) within the areas targeted by this assay, and inadequate number of viral copies(<138 copies/mL). A negative result must be combined with clinical observations, patient history, and epidemiological information. The expected result is Negative.  Fact Sheet for Patients:  EntrepreneurPulse.com.au  Fact Sheet for Healthcare Providers:  IncredibleEmployment.be  This test is no t yet approved or cleared by the [place] FDA and  has been authorized for detection and/or diagnosis of SARS-CoV-2 by FDA under an Emergency Use Authorization (EUA). This EUA will remain  in effect (meaning this test can be used) for the duration of the COVID-19 declaration under Section 564(b)(1) of the Act, 21 U.S.C.section 360bbb-3(b)(1), unless the authorization is terminated  or revoked sooner.       Influenza A by PCR NEGATIVE NEGATIVE Final   Influenza B by PCR NEGATIVE NEGATIVE Final    Comment: (NOTE) The Xpert Xpress SARS-CoV-2/FLU/RSV plus assay is intended as an aid in the diagnosis of influenza from Nasopharyngeal swab specimens and should not be used as a sole basis for treatment. Nasal washings and aspirates are unacceptable for Xpert Xpress SARS-CoV-2/FLU/RSV testing.  Fact Sheet for Patients: EntrepreneurPulse.com.au  Fact Sheet for Healthcare Providers: IncredibleEmployment.be  This test is not yet approved or cleared by the [place] FDA and has been authorized for detection and/or diagnosis of SARS-CoV-2 by FDA under an Emergency Use Authorization (EUA). This EUA will remain in effect (meaning this test can be used) for the duration of the COVID-19 declaration under Section 564(b)(1) of the Act, 21 U.S.C. section 360bbb-3(b)(1), unless the authorization is terminated  or revoked.  Performed at [facility], [place] [address], [place], [place] [address]   MRSA Next Gen by PCR, Nasal     Status: None   Collection Time: 07/24/21  6:56 PM   Specimen: Nasal Mucosa; Nasal Swab  Result Value Ref Range Status   MRSA by PCR Next Gen NOT DETECTED NOT DETECTED Final    Comment: (NOTE) The GeneXpert MRSA Assay (FDA approved for NASAL specimens only), is one component of a comprehensive MRSA colonization surveillance program. It is not intended to  diagnose MRSA infection nor to guide or monitor treatment for MRSA infections. Test performance is not FDA approved in patients less than 14 years old. Performed at [facility], [place] [address], [place], [place] [address]       Studies: CT HEAD WO CONTRAST (5MM)  Result Date: 07/27/2021 CLINICAL DATA:  Altered mental status. EXAM: CT HEAD WITHOUT CONTRAST TECHNIQUE: Contiguous axial images were obtained from the base of the skull through the vertex without intravenous contrast. RADIATION DOSE REDUCTION: This exam was performed according to the departmental dose-optimization program which includes automated exposure control, adjustment of the mA and/or kV according to patient size and/or use of iterative reconstruction technique. COMPARISON:  September 10, 2006 FINDINGS: Brain: Motion degraded exam despite repeated attempts. No evidence of acute infarction, hemorrhage, hydrocephalus, extra-axial collection or mass lesion/mass effect. Vascular: No hyperdense vessel or unexpected calcification. Skull: Normal. Negative for fracture or focal lesion. Sinuses/Orbits: No acute finding. Other: None. IMPRESSION: 1. Motion degraded exam despite repeated attempts. 2. No acute intracranial abnormalities. Electronically Signed   By: Fidela Salisbury M.D.   On: 07/27/2021 13:55   ECHOCARDIOGRAM COMPLETE  Result Date: 07/27/2021    ECHOCARDIOGRAM REPORT   Patient Name:   Meredith KLEMMER Date of Exam:  07/27/2021 Medical Rec #:  [id number]    Height:       62.0 in Accession #:    [id number]   Weight:       107.1 lb Date of Birth:  11/10/1955   BSA:          1.466 m Patient Age:    64 years     BP:           98/62 mmHg Patient Gender: F            HR:           85 bpm. Exam Location:  Inpatient Procedure: 2D Echo, Cardiac Doppler and Color Doppler Indications:    Respiratory distress  History:        Patient has no prior history of Echocardiogram examinations.                 Risk Factors:Hypertension.  Sonographer:    Jyl Heinz Referring Phys: [place]  1. Left ventricular ejection fraction, by estimation, is 60 to 65%. The left ventricle has normal function. The left ventricle has no regional wall motion abnormalities. Left ventricular diastolic parameters are consistent with Grade I diastolic dysfunction (impaired relaxation).  2. Right ventricular systolic function is normal. The right ventricular size is normal. Tricuspid regurgitation signal is inadequate for assessing PA pressure.  3. The mitral valve is normal in structure. Trivial mitral valve regurgitation.  4. The aortic valve is tricuspid. There is mild calcification of the aortic valve. There is mild thickening of the aortic valve. Aortic valve regurgitation is not visualized.  5. The inferior vena cava is normal in size with <50% respiratory variability, suggesting right atrial pressure of 8 mmHg. Comparison(s): No prior Echocardiogram. FINDINGS  Left Ventricle: Left ventricular ejection fraction, by estimation, is 60 to 65%. The left ventricle has normal function. The left ventricle has no regional wall motion abnormalities. The left ventricular internal cavity size was normal in size. There is  no left ventricular hypertrophy. Left ventricular diastolic parameters are consistent with Grade I diastolic dysfunction (impaired relaxation). Right Ventricle: The right ventricular size is normal. No increase in right ventricular  wall thickness. Right ventricular systolic function is normal. Tricuspid regurgitation  signal is inadequate for assessing PA pressure. Left Atrium: Left atrial size was normal in size. Right Atrium: Right atrial size was normal in size. Pericardium: Ascites is present. There is no evidence of pericardial effusion. Mitral Valve: The mitral valve is normal in structure. There is mild thickening of the mitral valve leaflet(s). There is mild calcification of the mitral valve leaflet(s). Mild mitral annular calcification. Trivial mitral valve regurgitation. Tricuspid Valve: The tricuspid valve is normal in structure. Tricuspid valve regurgitation is trivial. Aortic Valve: The aortic valve is tricuspid. There is mild calcification of the aortic valve. There is mild thickening of the aortic valve. Aortic valve regurgitation is not visualized. Aortic valve peak gradient measures 6.1 mmHg. Pulmonic Valve: The pulmonic valve was normal in structure. Pulmonic valve regurgitation is trivial. Aorta: The aortic root is normal in size and structure. Venous: The inferior vena cava is normal in size with less than 50% respiratory variability, suggesting right atrial pressure of 8 mmHg. IAS/Shunts: The atrial septum is grossly normal.  LEFT VENTRICLE PLAX 2D LVIDd:         3.60 cm     Diastology LVIDs:         2.40 cm     LV e' medial:    6.31 cm/s LV PW:         0.80 cm     LV E/e' medial:  13.9 LV IVS:        1.00 cm     LV e' lateral:   8.16 cm/s LVOT diam:     2.00 cm     LV E/e' lateral: 10.7 LV SV:         57 LV SV Index:   39 LVOT Area:     3.14 cm  LV Volumes (MOD) LV vol d, MOD A2C: 55.8 ml LV vol d, MOD A4C: 57.5 ml LV vol s, MOD A2C: 20.8 ml LV vol s, MOD A4C: 22.2 ml LV SV MOD A2C:     35.0 ml LV SV MOD A4C:     57.5 ml LV SV MOD BP:      35.8 ml RIGHT VENTRICLE             IVC RV Basal diam:  2.50 cm     IVC diam: 1.40 cm RV Mid diam:    2.10 cm RV S prime:     12.60 cm/s TAPSE (M-mode): 1.9 cm LEFT ATRIUM            Index        RIGHT ATRIUM          Index LA diam:      2.90 cm 1.98 cm/m   RA Area:     7.85 cm LA Vol (A2C): 20.3 ml 13.85 ml/m  RA Volume:   14.10 ml 9.62 ml/m LA Vol (A4C): 22.5 ml 15.35 ml/m  AORTIC VALVE AV Area (Vmax): 2.51 cm AV Vmax:        123.00 cm/s AV Peak Grad:   6.1 mmHg LVOT Vmax:      98.30 cm/s LVOT Vmean:     66.400 cm/s LVOT VTI:       0.182 m  AORTA Ao Root diam: 2.90 cm MITRAL VALVE MV Area (PHT): 4.80 cm     SHUNTS MV Decel Time: 158 msec     Systemic VTI:  0.18 m MV E velocity: 87.50 cm/s   Systemic Diam: 2.00 cm MV A velocity: 107.00 cm/s MV E/A ratio:  0.82 Heather  Pemberton MD Electronically signed by Gwyndolyn Kaufman MD Signature Date/Time: 07/27/2021/2:40:17 PM    Final     Scheduled Meds:  Chlorhexidine Gluconate Cloth  6 each Topical Daily   feeding supplement (PROSource TF)  45 mL Per Tube Daily   folic acid  1 mg Per Tube Daily   free water  175 mL Per Tube Q6H   insulin aspart  0-9 Units Subcutaneous Q4H   lactulose  30 g Per Tube BID   mouth rinse  15 mL Mouth Rinse BID   midodrine  10 mg Per Tube TID WC   multivitamin with minerals  1 tablet Per Tube Daily   pantoprazole (PROTONIX) IV  40 mg Intravenous Q24H   sodium chloride flush  10-40 mL Intracatheter Q12H   thiamine  100 mg Per Tube Daily   Or   thiamine  100 mg Intravenous Daily    Continuous Infusions:  sodium chloride Stopped (07/28/21 0342)   dexmedetomidine (PRECEDEX) IV infusion 0.4 mcg/kg/hr (07/28/21 0946)   dextrose 5 % and 0.45% NaCl Stopped (07/27/21 0923)   feeding supplement (OSMOLITE 1.5 CAL) 30 mL/hr at 07/28/21 0732   piperacillin-tazobactam (ZOSYN)  IV Stopped (07/28/21 1001)     LOS: 4 days     Kayleen Memos, MD [facility] Pager [phone]  If 7PM-7AM, please contact night-coverage [email or web address] Password [email or web address] 07/28/2021, 1:03 PM

## 2021-07-28 NOTE — Progress Notes (Addendum)
Palliative Care Brief Note  I saw and examined Meredith Goodman today.  She remains somnolent on Precedex infusion.  Family at bedside reports that patient's daughter, Meredith Goodman, would be best person to try to arrange for meeting to discuss goals of care.  Report was that she was planning to come and visit today.  I spoke with patient's RN and asked her to page me when Meredith Goodman arrived.  I checked back at the end of the shift and Meredith Goodman had not made it in today.  I left my card at the bedside and asked staff to ask her to call me.  Palliative to reach out to family again tomorrow.  Micheline Rough, MD [place] Team [phone]  No charge note

## 2021-07-28 NOTE — Progress Notes (Signed)
Daily Progress Note   Patient Name: Meredith Goodman       Date: 07/28/2021 DOB: [birth year]  Age: 66 y.o. MRN#: [id number] Attending Physician: Kayleen Memos, DO Primary Care Physician: Pcp, No Admit Date: 07/24/2021  Reason for Consultation/Follow-up: Establishing goals of care  Subjective: I saw and examined [name] today.  She was lying in bed in no distress on Precedex infusion.  She was sleepy but would answer a couple of yes or no questions.  I met today with her sister and her daughter, Meredith Goodman.  I introduced palliative care as specialized medical care for people living with serious illness. It focuses on providing relief from the symptoms and stress of a serious illness. The goal is to improve quality of life for both the patient and the family.  We discussed family being the most important thing to [name].  She has 3 children.  She comes from a big family with over a dozen brothers and sisters.  Her daughter reports the doctors have been doing a good job explaining things to them.  We reviewed her clinical course with multiple medical problems and how they are related to the underlying metastatic cancer.  We reviewed multiple concerns including infection, multisystem organ failure, and progression of cancer without being a candidate for further chemotherapy.  We reviewed her imaging and I showed them comparable CT scans from last winter to this admission with significant increase in size and development of discernible masses in her liver.   Concept of Hospice and Palliative Care discussed.  Her sister expressed not wanting to, "give up."  Discussed that there does not ever become a point where there is not care to give, but, when the point comes where she is not a candidate for further  disease modifying therapy, the way to best add time and quality to once life is through aggressive symptom management.  We discussed stated goal of her getting well enough to go home to spend time with family and concern that her condition may continue to deteriorate.  We discussed aggressiveness of care and the concern that heroic interventions in the event of cardiac or respiratory arrest are not going to result in her being well enough to discharge from the hospital and spend meaningful time with family.  They report they have been  told this by multiple physicians and they are continuing to consider this as a family.   Questions and concerns addressed.   PMT will continue to support holistically.  Length of Stay: 4  Current Medications: Scheduled Meds:   Chlorhexidine Gluconate Cloth  6 each Topical Daily   feeding supplement (PROSource TF)  45 mL Per Tube Daily   folic acid  1 mg Per Tube Daily   free water  200 mL Per Tube Q4H   insulin aspart  0-9 Units Subcutaneous Q4H   lactulose  30 g Per Tube BID   mouth rinse  15 mL Mouth Rinse BID   midodrine  10 mg Per Tube TID WC   multivitamin with minerals  1 tablet Per Tube Daily   pantoprazole sodium  40 mg Per Tube QHS   sodium chloride flush  10-40 mL Intracatheter Q12H   thiamine  100 mg Per Tube Daily   Or   thiamine  100 mg Intravenous Daily    Continuous Infusions:  sodium chloride Stopped (07/28/21 0342)   dexmedetomidine (PRECEDEX) IV infusion 0.4 mcg/kg/hr (07/28/21 2010)   dextrose 5 % and 0.45% NaCl Stopped (07/27/21 0923)   feeding supplement (OSMOLITE 1.5 CAL) 1,000 mL (07/28/21 2031)   piperacillin-tazobactam (ZOSYN)  IV Stopped (07/28/21 1802)    PRN Meds: sodium chloride, fentaNYL (SUBLIMAZE) injection, lip balm, metoprolol tartrate, oxyCODONE, sodium chloride flush  Physical Exam     General: Sleepy but arousable.  No distress.  Chronically ill-appearing HEENT: No bruits, no goiter, no  JVD Heart: Regular rate and rhythm. No murmur appreciated. Lungs: Good air movement, clear Abdomen:distended, positive bowel sounds.   Ext: No significant edema Skin: Warm and dry Neuro: On Precedex      Vital Signs: BP (!) 142/63    Pulse 76    Temp 97.9 F (36.6 C) (Axillary)    Resp 18    Wt 49.6 kg    SpO2 100%    BMI 20.00 kg/m  SpO2: SpO2: 100 % O2 Device: O2 Device: Nasal Cannula O2 Flow Rate: O2 Flow Rate (L/min): 2 L/min  Intake/output summary:  Intake/Output Summary (Last 24 hours) at 07/28/2021 2051 Last data filed at 07/28/2021 1800 Gross per 24 hour  Intake 1384.72 ml  Output 1060 ml  Net 324.72 ml   LBM: Last BM Date: 07/28/21 Baseline Weight: Weight: 48.3 kg Most recent weight: Weight: 49.6 kg       Palliative Assessment/Data:    Flowsheet Rows    Flowsheet Row Most Recent Value  Intake Tab   Referral Department Hospitalist  Unit at Time of Referral ICU  Palliative Care Primary Diagnosis Cancer  Date Notified 07/25/21  Palliative Care Type New Palliative care  Reason for referral Clarify Goals of Care  Date of Admission 07/24/21  Date first seen by Palliative Care 07/26/21  # of days Palliative referral response time 1 Day(s)  # of days IP prior to Palliative referral 1  Clinical Assessment   Palliative Performance Scale Score 10%  Psychosocial & Spiritual Assessment   Palliative Care Outcomes   Patient/Family meeting held? 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 female  with  past medical history of hypertension, carcinoid tumor of the ileum, small cell lung cancer, recent discovery of liver mets who recently started on chemotherapy with Dr. Burr Medico admitted on 07/24/2021 with shortness of breath.  CTA ruled out PE but showed right upper lobe mass versus pneumonia.  Her mental status has declined and she was obtunded but then became very combative and currently on Precedex infusion.  She was evaluated by GI due to decompensated liver with worsening pancytopenia and concern for hepatic encephalopathy.  Dr. Burr Medico evaluated her and discussed poor prognosis with patient and her son.  Recommendation for DNR/DNI was made.  She does remain a full code at this time.  Noted to have found white rock substance in her room and UDS was positive for cocaine.  Precedex started 1/14 due to continued agitation encephalopathy.    Recommendations/Plan: Full code/full scope Discussed with patient's daughter, Meredith Goodman, and her sister today.  At this point, family is invested in continuation of current interventions, however, they are discussing as a family regarding long and short-term goals of care.  I left a copy of hard choices for loving people as well as my card with family.  Meredith Goodman is going to speak further with her brothers about this later today.  I asked her to let us know if there was a time that they would like to meet for another family meeting. Palliative care to continue to follow.  Goals of Care and Additional Recommendations: Limitations on Scope of Treatment: Full Scope Treatment  Code Status:    Code Status Orders  (From admission, onward)           Start     Ordered   07/24/21 1728  Full code  Continuous        07/24/21 1727           Code Status History     This patient has a current code status but no historical code status.       Prognosis: Guarded  Discharge Planning: To Be Determined  Care plan was discussed with daughter, sister  Thank you for  allowing the Palliative Medicine Team to assist in the care of this patient.   Time In: 1310 Time Out: 1410 Total Time 60 Prolonged Time Billed No   Keiondre Colee Domingo Cocking, MD  Please contact Palliative Medicine Team phone at [phone] for questions and concerns.

## 2021-07-28 NOTE — Progress Notes (Addendum)
NAME:  Meredith Goodman, MRN:  [id number], DOB:  May 23, 1956, LOS: 4 ADMISSION DATE:  07/24/2021, CONSULTATION DATE: 1/14  REFERRING MD:  Dr Irene Pap of [facility], CHIEF COMPLAINT:  Acute Encephalopathy - agitated   BRIEF Hx:  66 year old F, smoker, alcoholic and does crack cocaine [UDS this admission positive for opioids and cocaine], along with prescription for oxycodone and MS Contin for chronic cancer-related pain with declining performance status most recently ECOG 3 on 07/21/21 oncology visit.  In 2019 she had malignant carcinoid tumor of the ileum stage III followed by right hemicolectomy at [facility] March 2019.  Then in the summer 2022 had reactive mediastinal adenopathy that by December 2022 showed extensive metastatic disease in the liver.  She completed palliative chemo cycle 1 days 1-3 on 1/5 - 1/7.  Carboplatin on day 1, etoposide on days 1-3 with GCSF on day 5, and atezolizumab q3 weeks.  Most recent oncology visit on 07/21/2021 she was found to be in the wheelchair, ongoing drinking, ongoing substance abuse nausea and vomiting and jaundice, loss of prescription opioids, cough and dyspnea.  Outpatient labs on 07/21/2021 showed anemia hemoglobin 9.3, thrombocytopenia platelet 77,000, renal insufficiency with a creatinine 1.4, jaundice with a bilirubin of 14.3 [worse since 07/17/2021 but was 1.4].  Goals of care discussion held advised DNR/DNI and no further chemo liver failure gets worse and to quit drinking alcohol.  She presented to the emergency department 1/12 with shortness of breath requiring 4 L nasal cannula, hypoxemia, acute respiratory failure pulm embolism ruled out associated acute metabolic encephalopathy on admission [sepsis and SBP suspected], worsening jaundice with a bilirubin of 16, coagulopathy with INR 2.1, leukopenia, ongoing ascites, pancytopenia.  Developed agitated delirium & was started on precedex.  There were concerns she was brought in illegal substances on 1/13.    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 Crack cocaine found in room - daughter reported it was because the patient got bad news, in a frame of mind of wanting to give up.  Lactulose started.   11/14 Agitated encephalopathy, CCM consulted. Precedex initiated.   1/16 GI signed off.  Bili slightly improved.  Remains on precedex. Palliative Care following.   Interim History / Subjective:  Afebrile  RN reports pt on precedex, moans / appears uncomfortable  Received fluid bolus overnight for hypotension, albumin Ongoing high volume stool while on lactulose   Objective   Blood pressure (!) 101/52, pulse 76, temperature 97.7 F (36.5 C), temperature source Axillary, resp. rate (!) 21, weight 49.6 kg, SpO2 95 %. CVP:  [10 mmHg] 10 mmHg      Intake/Output Summary (Last 24 hours) at 07/28/2021 0817 Last data filed at 07/28/2021 3976 Gross per 24 hour  Intake 2380.07 ml  Output 1700 ml  Net 680.07 ml   Filed Weights   07/24/21 1900 07/28/21 0500  Weight: 48.3 kg 49.6 kg    Examination: General: ill appearing adult female lying in bed, uncomfortable appearing / moans in pain  HEENT: MM pink/dry, temporal wasting, poor dentition   Neuro: lethargic, moans / moves all extremities spontaneously CV: s1s2 RRR, no m/r/g PULM:  non-labored at rest, lungs bilaterally with rhonchi, moist cough GI: protuberant, no fluid wave, decreased BS Extremities: warm/dry, generalized 1-2+ pitting edema  Skin: no rashes or lesions  Resolved Hospital Problem list      Assessment & Plan:   Metastatic Small Cell (POA) Initially GI with mets to lung and liver.    -not a candidate for further therapy per ONC -recommend Pallative Care consultation -was recommended DNR / DNI -grim prognosis / life limiting illness given no longer a chemo candidate or responding to therapy  -consider transitioning off precedex, concerned she has pain that may be under treated given home oxycodone use   Acute Hypoxemic Respiratory Failure in setting of Metastatic Small Cell  Possible Aspiration given Encephalopathy  -wean O2 for sats >90%  -would not recommend intubation given this is not reversible process  -continue empiric abx per [facility]  Hepatic Encephalopathy / Acute Metabolic Encephalopathy Hyperammonemia   Not enough fluid for paracentesis on 1/13 per IR.  Elevated ammonia due to liver dysfunction / metastatic disease. Possible withdrawal given chronic opioid use / poly substance abuse. CT head w/o acute intracranial process 1/15.  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  -supportive care  -continue lactulose, follow clinical exam   AKI (POA) Hypokalemia  Resolved  -Trend BMP / urinary output -Replace electrolytes as indicated -Avoid nephrotoxic agents, ensure adequate renal perfusion   Opioid Dependence in the setting of Cancer Pain  Polysubstance Abuse  -precedex for delirium  -consider scheduling narcotics for pain  -RASS Goal 0 to -1 while on precedex   Hx HTN  ST  -follow tele  -hold home antihypertensives  -ensure patient fluid balance goal euvolemia with high stool output on lactulose   Anemia / Pancytopenia  Chronic Thrombocytopenia  Post chemotherapy, hx ETOH use -trend CBC -transfuse for Hgb >7%  At Risk Hyper/Hypoglycemia  -SSI, per [facility]    Adult Failure to Thrive  Severe Protein Calorie Malnutrition  Poor mobility prior to admit. ECOG 3. Hx longstanding substance abuse & now metastatic disease.   -Palliative Care  -nutrition as able / aspiration precautions   Best practice (daily eval):  Diet: npo,  med via pand Pain/Anxiety/Delirium protocol (if indicated): see neur VAP protocol (if indicated): x DVT prophylaxis: scd GI prophylaxis: ppi Glucose control: ssi per [facility] Mobility: bed rest Code Status: full Family Communication: Daughter, [name], met with palliative care 1/16.  They are to discuss as a family re goals of care.   Critical Care Time: 59 minutes     Noe Gens, MSN, APRN, NP-C, AGACNP-BC [facility] 07/28/2021, 8:17 AM   Please see [email or web address] for pager details.   From 7A-7P if no response, please call [phone] After hours, please call ELink [phone]

## 2021-07-28 NOTE — Progress Notes (Signed)

## 2021-07-28 NOTE — Progress Notes (Addendum)
HEMATOLOGY-ONCOLOGY PROGRESS NOTE  ASSESSMENT AND PLAN: 1.  Metastatic small cell cancer 2.  Pancytopenia 3.  Sepsis with intra-abdominal source suspected 4.  Decompensated cirrhosis with ascites 5.  Metabolic encephalopathy,?  Hepatic encephalopathy 6.  Coagulopathy 7.  Acute respiratory failure with hypoxia 8.  AKI 9.  Goals of care discussion  -The patient has been receiving palliative chemotherapy and to started first-line therapy with carboplatin/etoposide/atezolizumab.  Given her multiorgan failure and overall poor performance status, she is not a candidate for additional chemotherapy.  We have discussed with the patient and her family that due to underlying metastatic malignancy and organ failure, she overall has a poor prognosis.  CODE STATUS has been discussed with the patient previously and she wishes to remain a full code.  Palliative care is engaged and will continue conversations.  Recommend DNR/DNI. -CBC from this morning has been reviewed.  Her WBC is slowly improving.  She received G-CSF in our office following chemotherapy.  Hemoglobin stable and platelet count slowly trending upward.  Recommend PRBC transfusion for hemoglobin less than 7.5 and platelet transfusion for platelet count less than 20,000 or active bleeding. -Remains on Zosyn.  Afebrile.  Cultures negative to date.  Management per primary team. -Currently on lactulose for possible hepatic encephalopathy.  LFTs up slightly this morning, T bili trending downward ammonia level trending upward.  GI has signed off.  Monitor. -Her INR is 1.5 this morning.  Monitor.  Mikey Bussing, DNP, AGPCNP-BC, AOCNP  SUBJECTIVE: Meredith Goodman is sedated this morning.  She is on Precedex.  Trying to wean.  Receiving albumin this morning.  Received a fluid bolus overnight due to hypotension.  She is in restraints this morning.  She has been agitated at times and combative.  Discussed with nursing who reports that she is having a lot of  liquid stools secondary to lactulose.  No bleeding reported.  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 Silverio Decamp impression - Preparation of the colon was fair. - Rule out malignancy, tumor at the ileocecal valve. Biopsied. - Rule out malignancy, tumor in the terminal ileum. Biopsied. - Severe diverticulosis in the sigmoid colon, in the descending colon, in the transverse colon, in the ascending colon and in the cecum. There was narrowing of the colon in association with the diverticular opening. There was evidence of an impacted diverticulum. There was no evidence of diverticular bleeding. - Non-bleeding internal hemorrhoids. - Two 3 to 4 mm polyps in the rectum and in the transverse colon, removed with a cold snare. Resected and retrieved.   08/18/2017 Initial Biopsy   Diagnosis 1. Terminal ileum, biopsy, polyp - ILEAL MUCOSA WITH MINIMAL TO MILD NONSPECIFIC ARCHITECTURAL CHANGES. SEE NOTE. - NEGATIVE FOR ACUTE INFLAMMATION, DYSPLASIA OR GRANULOMAS. 2. Ileocecal valve, biopsy, mass - LOW GRADE (GRADE 1) NEUROENDOCRINE (CARCINOID) TUMOR. - KI-67 IMMUNOHISTOCHEMICAL STAIN SHOWS A PROLIFERATION INDEX OF LESS THAN 1%, CONSISTENT WITH THE ABOVE DIAGNOSIS. SEE NOTE. 3. Colon, biopsy, transverse and rectal, polyp (2) - TUBULAR ADENOMA WITHOUT HIGH GRADE DYSPLASIA OR MALIGNANCY. - HYPERPLASTIC POLYP.   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 Enhancing 2.5 cm cecal mass at the superior margin of the ileocecal valve, compatible with known neuroendocrine tumor in this location.  No bowel obstruction. 2. Clustered borderline prominent right lower quadrant mesenteric nodes, cannot exclude locoregional nodal metastases. 3. No additional potential sites of metastatic disease in the abdomen or pelvis. 4. Subcentimeter nonaggressive pancreatic body cystic lesions. Follow-up MRI abdomen without and with IV contrast is recommended in 1 year. This recommendation follows ACR consensus guidelines: Management of Incidental Pancreatic Cysts: A White Paper of the ACR Incidental Findings Committee. [place] [id number]. 5. Submucosal small right fundal uterine fibroid.   08/26/2017 Imaging   MR ABD MRCP IMPRESSION: 1. Enhancing 2.5 cm cecal mass at the superior margin of the ileocecal valve, compatible with known neuroendocrine tumor in this location. No bowel obstruction. 2. Clustered borderline prominent right lower quadrant mesenteric nodes, cannot exclude locoregional nodal metastases. 3. No additional potential sites of metastatic disease in the abdomen or pelvis. 4. Subcentimeter nonaggressive pancreatic body cystic lesions. Follow-up MRI abdomen without and with IV contrast is recommended in 1 year. This recommendation follows ACR consensus guidelines: Management of Incidental Pancreatic Cysts: A White Paper of the ACR Incidental Findings Committee. [place] 8119;[id number]. 5. Submucosal small right fundal uterine fibroid.   08/30/2017 Tumor Marker   Chromogranin A: 150 (08/30/2017 pre-op) Chromogranin A: 127 (10/11/2017 post op)   09/24/2017 Surgery   Right hemicolectomy at [facility]   09/24/2017 Pathology Results   A: Terminal ileum, cecum, appendix and right colon, right hemicolectomy - Well differentiated neuroendocrine tumor, histologic grade G1, involving ileocecal valve and ileum, multifocal, see synoptic report for additional information. 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 - Benign  appendix with multiple appendiceal diverticuli. - Right colon diverticulosis. - Negative for dysplasia or carcinoma. TUMOR    Tumor Site:    Ileum: Ileocecal valve and terminal ileum     Histologic Type and Grade:    G1: Well-differentiated neuroendocrine tumor     Mitotic Rate:    < 2 mitoses / 2 mm2     Ki-67  Labeling Index:    < 3%   Tumor Size:    Greatest dimension in Centimeters (cm): 2.1 Centimeters (cm)  Tumor Focality:    Multifocal (number of tumors): 2   Tumor Extent:        Tumor Extension:    Tumor invades the submucosa   Accessory Findings:        Lymphovascular Invasion:    Present     Perineural Invasion:    Not identified     Large Mesenteric Masses (> 2 cm):    Not identified  MARGINS  Margins:    All margins are uninvolved by tumor     Margins Examined:    Proximal     Margins Examined:    Distal     Margins Examined:    Radial or mesenteric     Distance of Tumor from Closest Margin:    8.2 Centimeters (cm)    Closest Margin:    Radial or mesenteric  LYMPH NODES  Number of Lymph Nodes Involved:    5   Number of Lymph Nodes Examined:    27  PATHOLOGIC STAGE CLASSIFICATION (pTNM, AJCC 8th Edition)  Primary Tumor (pT):    pT2   Regional Lymph Nodes (pN):    pN1  ADDITIONAL FINDINGS  Additional Pathologic Findings:    Mesenteric tumor deposit(s) <=  2 cm    09/24/2017 Cancer Staging   Staging form: Jejunum and Ileum - Neuroendocine Tumors, AJCC 8th Edition - Pathologic stage from 09/24/2017: Stage III (pT2, pN1, cM0) - Signed by Truitt Merle, MD on 01/23/2020   09/15/2018 Imaging   CT AP w contrast IMPRESSION: 1. Status post right partial colectomy without mechanical bowel obstruction nor inflammation. No evidence of local recurrence. 2. Moderate stool retention within the colon with left-sided scattered colonic diverticulosis but without acute diverticulitis. 3. Small hypodensities likely representing cysts of the liver, the largest measuring 13 mm in the right  hepatic lobe. 4. Degenerative disc disease of the lumbar spine as above grade 1 anterolisthesis of L3 on L4. No acute nor suspicious osseous abnormalities.   09/14/2019 Imaging   CT AP IMPRESSION: 1. Redemonstrated postoperative findings of right hemicolectomy. No evidence of recurrent or metastatic disease in the abdomen or pelvis. 2. Multiple small low-attenuation lesions of the liver, the largest of which are clearly fluid attenuation cysts or hemangiomata and characterized as such by prior MRI, others too small to characterize. Attention on follow-up. 3. Coronary artery disease.  Aortic Atherosclerosis (ICD10-I70.0).   01/25/2020 Procedure   Colonoscopy by Dr Silverio Decamp  IMPRESSION - Patent end-to-side ileo-colonic anastomosis, characterized by healthy appearing mucosa. - The examined portion of the ileum was normal. - Diverticulosis in the sigmoid colon. - Non-bleeding internal hemorrhoids. - The examination was otherwise normal on direct and retroflexion views. - No specimens collected.   09/16/2020 Imaging   CT CAP  IMPRESSION: 1. Solid lobular 8 mm right upper lobe pulmonary nodule and 3 mm right lower lobe pulmonary nodules. Findings which are nonspecific, attention on short-term interval follow-up CT in 3 months is recommended. 2. Similar postsurgical findings of right hemicolectomy and reanastomosis. 3. No significant change in bilobar hypodense hepatic lesions, largest of which are clearly fluid attenuating cysts or hemangiomas and were characterized as such by prior MRI the abdomen, other smaller ones are too small to accurately characterize but also favored to represent benign cysts. 4. 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 Recommend annual imaging followup by CTA or MRA. This recommendation follows 2010 ACCF/AHA/AATS/ACR/ASA/SCA/SCAI/SIR/STS/SVM Guidelines for the Diagnosis and Management of Patients with  Thoracic Aortic Disease. Circulation. 2010; 121: X323-F573. Aortic aneurysm NOS (ICD10-I71.9) 5. Interval resolution of the right middle lobe opacity. 6. Colonic diverticulosis without findings of acute diverticulitis. 7. Emphysema and aortic atherosclerosis.   Aortic Atherosclerosis (ICD10-I70.0) and Emphysema (ICD10-J43.9).   12/19/2020 Imaging   CT Chest  IMPRESSION: 1. Enlarging elongated nodule in the RIGHT upper lobe is concerning for bronchial neoplasm. In Patient with history of carcinoid of the ileum, consider neuroendocrine tumor metastasis. Recommend DOTATATE PET scan versus tissue sampling 2. Interval enlargement RIGHT paratracheal adenopathy.     02/28/2021 PET scan   IMPRESSION: 1. Intensely radiotracer avid RIGHT upper lobe pulmonary nodule consistent with metastatic well differentiated neuroendocrine tumor. 2. Intense radiotracer avid metastatic well differentiated neuroendocrine tumor adenopathy to the RIGHT paratracheal nodal station, RIGHT hilar nodal station, and RIGHT supraclavicular nodal station. 3. Post RIGHT hemicolectomy without evidence of local recurrence. 4. No evidence of metastatic disease in liver or mesentery.   07/01/2021 Imaging   EXAM: CT ABDOMEN AND PELVIS WITH CONTRAST  IMPRESSION: Multiple too numerous to count peripherally enhancing lesions within the liver consistent with metastatic disease. Associated porta hepatis lymph nodes are noted as well.   4 mm nodule in the right lung base not seen  on the prior PET-CT examination and given the findings in the liver is somewhat suspicious for metastatic disease.   Changes of prior right colectomy.   07/11/2021 Pathology Results   FINAL MICROSCOPIC DIAGNOSIS:   A. LIVER, NEEDLE CORE BIOPSY:  - Metastatic small cell carcinoma, see comment  COMMENT:  Immunohistochemical stains show that the tumor cells are positive for TTF-1, synaptophysin and CD56; while they are negative for CDX2, consistent  with above interpretation.  Immunostain for Ki-67 shows a proliferative index of about 40-45%.  Findings are compatible with clinical suspicion of small cell transformation of patient's known neuroendocrine tumor.    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[place]), stage IV  07/11/2021 Pathology Results   FINAL MICROSCOPIC DIAGNOSIS:   A. LIVER, NEEDLE CORE BIOPSY:  - Metastatic small cell carcinoma, see comment  COMMENT:  Immunohistochemical stains show that the tumor cells are positive for TTF-1, synaptophysin and CD56; while they are negative for CDX2, consistent with above interpretation.  Immunostain for Ki-67 shows a proliferative index of about 40-45%.  Findings are compatible with clinical suspicion of small cell transformation of patient's known neuroendocrine tumor.    07/16/2021 Initial Diagnosis   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[place])   07/17/2021 -  Chemotherapy   Patient is on Treatment Plan : LUNG SCLC Carboplatin + Etoposide + Atezolizumab Induction q21d / Atezolizumab Maintenance q21d        REVIEW OF SYSTEMS:   Review of Systems  Reason unable to perform ROS: Sedated and cannot obtain review of systems this morning.  Constitutional:  Negative for chills and fever.   I have reviewed the past medical history, past surgical history, social history and family history with the patient and they are unchanged from previous note.   PHYSICAL EXAMINATION: ECOG PERFORMANCE STATUS: 3 - Symptomatic, >50% confined to bed  Vitals:   07/28/21 0600 07/28/21 0615  BP: 99/60 (!) 101/52  Pulse:    Resp: 19 (!) 21  Temp:    SpO2:     Filed Weights   07/24/21 1900 07/28/21 0500  Weight: 48.3 kg 49.6 kg    Intake/Output from previous day: 01/15 0701 - 01/16 0700 In: 2192.4 [I.V.:412.3; NG/GT:485.7; IV Piggyback:1294.4] Out: 1700 [Urine:400; Stool:1300]  Physical Exam Constitutional:      Comments: Sedated.  Cachectic.  HENT:     Head: Normocephalic.  Eyes:     Comments: Scleral icterus present   Cardiovascular:     Rate and Rhythm: Normal rate.  Pulmonary:     Effort: Pulmonary effort is normal.  Abdominal:     Tenderness: There is no abdominal tenderness.     Comments: Ascites present  Skin:    General: Skin is warm and dry.  Neurological:     Comments: Sedated.    LABORATORY DATA:  I have reviewed the data as listed CMP Latest Ref Rng & Units 07/28/2021 07/27/2021 07/26/2021  Glucose 70 - 99 mg/dL 158(H) 157(H) 136(H)  BUN 8 - 23 mg/dL 38(H) 29(H) 33(H)  Creatinine 0.44 - 1.00 mg/dL 1.23(H) 0.90 0.84  Sodium 135 - 145 mmol/L 147(H) 144 141  Potassium 3.5 - 5.1 mmol/L 4.2 2.6(LL) 3.7  Chloride 98 - 111 mmol/L 119(H) 112(H) 113(H)  CO2 22 - 32 mmol/L 20(L) 23 19(L)  Calcium 8.9 - 10.3 mg/dL 8.1(L) 8.0(L) 8.3(L)  Total Protein 6.5 - 8.1 g/dL 4.8(L) 4.8(L) 5.5(L)  Total Bilirubin 0.3 - 1.2 mg/dL 11.0(H) 11.6(H) 14.2(H)  Alkaline Phos 38 - 126 U/L 465(H) 359(H) 421(H)  AST 15 - 41 U/L  89(H) 64(H) 89(H)  ALT 0 - 44 U/L 56(H) 50(H) 59(H)    Lab Results  Component Value Date   WBC 3.4 (L) 07/28/2021   HGB 8.3 (L) 07/28/2021   HCT 23.2 (L) 07/28/2021   MCV 85.3 07/28/2021   PLT 53 (L) 07/28/2021   NEUTROABS 1.1 (L) 07/28/2021    No results found for: CEA1, CEA, CAN199, CA125, PSA1  DG Abd 1 View  Result Date: 07/26/2021 CLINICAL DATA:  Status post nasogastric tube placement. EXAM: ABDOMEN - 1 VIEW COMPARISON:  July 26, 2021 (5:04 p.m.) FINDINGS: The feeding tube seen on the prior study has been removed and has been replaced with a nasogastric tube. Its distal tip is seen overlying the expected region of the body of the stomach. The distal side hole sits along the gastroesophageal junction. The bowel gas pattern is normal. No radio-opaque calculi or other significant radiographic abnormality are seen. IMPRESSION: Nasogastric tube positioning, as described above. Electronically Signed   By: Virgina Norfolk M.D.   On: 07/26/2021 23:12   DG Abd 1 View  Result Date:  07/26/2021 CLINICAL DATA:  Feeding tube placement. EXAM: ABDOMEN - 1 VIEW COMPARISON:  CT, 07/01/2021. FINDINGS: Enteric feeding tube has its metallic tip in the proximal stomach, just below the GE junction. Normal bowel gas pattern. IMPRESSION: 1. Enteric feeding tube tip lies in the proximal stomach just below the GE junction. This will need to be further inserted to allow the tip to enter the duodenum by peristalsis. Recommend advancing 20 cm. Electronically Signed   By: Lajean Manes M.D.   On: 07/26/2021 17:40   CT HEAD WO CONTRAST (5MM)  Result Date: 07/27/2021 CLINICAL DATA:  Altered mental status. EXAM: CT HEAD WITHOUT CONTRAST TECHNIQUE: Contiguous axial images were obtained from the base of the skull through the vertex without intravenous contrast. RADIATION DOSE REDUCTION: This exam was performed according to the departmental dose-optimization program which includes automated exposure control, adjustment of the mA and/or kV according to patient size and/or use of iterative reconstruction technique. COMPARISON:  September 10, 2006 FINDINGS: Brain: Motion degraded exam despite repeated attempts. No evidence of acute infarction, hemorrhage, hydrocephalus, extra-axial collection or mass lesion/mass effect. Vascular: No hyperdense vessel or unexpected calcification. Skull: Normal. Negative for fracture or focal lesion. Sinuses/Orbits: No acute finding. Other: None. IMPRESSION: 1. Motion degraded exam despite repeated attempts. 2. No acute intracranial abnormalities. Electronically Signed   By: Fidela Salisbury M.D.   On: 07/27/2021 13:55   CT Angio Chest PE W and/or Wo Contrast  Result Date: 07/24/2021 CLINICAL DATA:  Shortness of breath, receiving chemotherapy for liver carcinoma. EXAM: CT ANGIOGRAPHY CHEST WITH CONTRAST TECHNIQUE: Multidetector CT imaging of the chest was performed using the standard protocol during bolus administration of intravenous contrast. Multiplanar CT image reconstructions  and MIPs were obtained to evaluate the vascular anatomy. RADIATION DOSE REDUCTION: This exam was performed according to the departmental dose-optimization program which includes automated exposure control, adjustment of the mA and/or kV according to patient size and/or use of iterative reconstruction technique. CONTRAST:  52mL OMNIPAQUE IOHEXOL 350 MG/ML SOLN COMPARISON:  None. FINDINGS: Cardiovascular: Satisfactory opacification of the pulmonary arteries to the segmental level. No evidence of pulmonary embolism. Normal heart size. Coronary artery atherosclerotic calcifications. No pericardial effusion. Mediastinum/Nodes: Right hilar region masslike opacity which may represent lymph nodes or mass. Lungs/Pleura: Emphysematous changes of bilateral lung apices. Large right upper lobe consolidation which extends into the right hilar region and is difficult to differentiate between the lung  consolidation/right hilar mass/lymph nodes. There are ground-glass opacities and fibrotic changes in the right upper lobe. Small right pleural effusion. Right lower lobe atelectasis with adjacent ground-glass opacities concerning for infectious/inflammatory process. Upper Abdomen: No acute abnormality. Enlarged liver. Evaluation of hepatic parenchyma is limited on this pulmonary angiogram. Musculoskeletal: Heterogeneous marrow density of the spine. No acute osseous abnormality. Review of the MIP images confirms the above findings. IMPRESSION: 1.  No evidence of pulmonary embolism. 2. Large right upper lobe consolidation which extends into the right hilar region. It may represent acute pneumonia with hilar lymphadenopathy, however a lung mass with metastatic lymph nodes can not be excluded. Short-term follow-up examination is recommended. 3. Small right pleural effusion with right basilar atelectasis with adjacent ground-glass opacities concerning for infectious/inflammatory process. 4.  Emphysematous changes of bilateral lungs. 5.  Heterogeneous bone density of the thoracic spine without evidence of acute osseous abnormality. Electronically Signed   By: Keane Police D.O.   On: 07/24/2021 16:23   US Abdomen Complete  Result Date: 07/25/2021 CLINICAL DATA:  Transaminitis. 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 EXAM: ABDOMEN ULTRASOUND COMPLETE COMPARISON:  None. FINDINGS: Gallbladder: There is diffuse heterogeneity of the gallbladder lumen with hyperechoic and anechoic regions and partial shadowing. This may be secondary to diffuse gallstones. The gallbladder wall thickness measures 3 mm, at the upper limits of normal. Common bile duct: Diameter: 4 mm, within normal limits Liver: Mildly nodular liver contour. Numerous hypoechoic nodular densities are seen throughout the liver consistent with previously reported metastatic disease. Within the right hepatic lobe there is a peripheral subcapsular anechoic avascular 2.1 x 1.2 x 2.0 cm cyst. At the periphery of this cyst is a tiny echogenic nodule measuring up to 6 mm that may represent one of the innumerable parenchymal nodules seen throughout the liver. Portal vein is patent on color Doppler imaging with normal direction of blood flow towards the liver. IVC: No abnormality visualized. Pancreas: Visualized portion unremarkable. Spleen: Size and appearance within normal limits. Right Kidney: Length: 9.7 cm. Echogenicity within normal limits. No mass or hydronephrosis visualized. Left Kidney: Length: 9.3 cm. Echogenicity within normal limits. No mass or hydronephrosis visualized. Abdominal aorta: No aneurysm visualized. The aortic bifurcation was not visualized. Other findings: Small left pleural effusion. Mild ascites around the liver and spleen. IMPRESSION:: IMPRESSION: 1. There are numerable nodules seen throughout the liver consistent with the diffuse metastases seen on prior CT and PET-CT. Within the posterior aspect of the right hepatic lobe there is a lobular  cystic lesion that is similar to 07/01/2021 prior CT. 2. There is diffuse heterogeneity of the gallbladder lumen. No gallbladder wall thickening. This may represent numerous gallstones. Gallbladder malignancy is felt less likely. 3. Mild ascites.  Small left pleural effusion. Electronically Signed   By: Yvonne Kendall M.D.   On: 07/25/2021 09:16   CT Abdomen Pelvis W Contrast  Result Date: 07/01/2021 CLINICAL DATA:  Abdominal pain EXAM: CT ABDOMEN AND PELVIS WITH CONTRAST TECHNIQUE: Multidetector CT imaging of the abdomen and pelvis was performed using the standard protocol following bolus administration of intravenous contrast. CONTRAST:  50m OMNIPAQUE IOHEXOL 350 MG/ML SOLN COMPARISON:  02/28/2021 PET-CT FINDINGS: Lower chest: Lung bases are free of acute infiltrate or sizable effusion. There is a 3-4 mm nodule identified in the medial aspect of the right lung base best seen on image number 24 of series 4. This was not present on the recent PET-CT from 02/28/2021. Hepatobiliary: Liver is well visualized with a few small  cysts identified within the left lobe of the liver and inferiorly in the right lobe of the liver stable in appearance from the prior CT. There are however new peripherally enhancing lesions scattered throughout the liver dominant lesion in the left lobe measures 2.6 cm best seen on image number 18 of series 2. Dominant lesion on the right measures approximately 2 cm best seen on image number 32 of series 2. 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 Pancreas: Unremarkable. No pancreatic ductal dilatation or surrounding inflammatory changes. Spleen: Normal in size without focal abnormality. Adrenals/Urinary Tract: Adrenal glands are unremarkable. Kidneys demonstrate a normal enhancement pattern bilaterally. No renal calculi or obstructive changes are seen. Normal  excretion of contrast is noted bilaterally. The bladder is well distended. Stomach/Bowel: Colon shows no obstructive or inflammatory changes. There are findings of prior resection of the right colon and proximal transverse colon seen. No small bowel obstructive changes are noted. The stomach is within normal limits. Vascular/Lymphatic: Diffuse vascular calcifications are seen. Lymphadenopathy is noted in the porta hepatis which was not well appreciated on recent PET-CT measuring up to 17 mm in short axis. No other definitive lymphadenopathy is seen. Reproductive: Uterus and bilateral adnexa are unremarkable. Other: No abdominal wall hernia or abnormality. No abdominopelvic ascites. Musculoskeletal: No acute or significant osseous findings. IMPRESSION: Multiple too numerous to count peripherally enhancing lesions within the liver consistent with metastatic disease. Associated porta hepatis lymph nodes are noted as well. 4 mm nodule in the right lung base not seen on the prior PET-CT examination and given the findings in the liver is somewhat suspicious for metastatic disease. Changes of prior right colectomy. Electronically Signed   By: Inez Catalina M.D.   On: 07/01/2021 15:42   DG Chest Port 1 View  Result Date: 07/24/2021 CLINICAL DATA:  History of metastatic neuroendocrine tumor EXAM: PORTABLE CHEST 1 VIEW COMPARISON:  PET-CT 02/28/2021 FINDINGS: Heart size is normal. Abnormal soft tissue prominence of the right hilum and right paratracheal stripe compatible with known metastatic disease. New airspace consolidation within the inferior aspect of the right upper lobe extending to the hilum which may represent atelectasis or infiltrate. Trace right pleural effusion. No pneumothorax. IMPRESSION: New airspace consolidation within the inferior aspect of the right upper lobe extending to the hilum which may represent atelectasis or infiltrate. Electronically Signed   By: Davina Poke D.O.   On: 07/24/2021 14:54    ECHOCARDIOGRAM COMPLETE  Result Date: 07/27/2021    ECHOCARDIOGRAM REPORT   Patient Name:   Meredith Goodman Date of Exam: 07/27/2021 Medical Rec #:  [id number]    Height:       62.0 in Accession #:    [id number]   Weight:       107.1 lb Date of Birth:  07-22-1955   BSA:          1.466 m Patient Age:    22 years     BP:           98/62 mmHg Patient Gender: F            HR:           85 bpm. Exam Location:  Inpatient Procedure: 2D Echo, Cardiac Doppler and Color Doppler Indications:    Respiratory distress  History:        Patient has no prior history of Echocardiogram examinations.  Risk Factors:Hypertension.  Sonographer:    Jyl Heinz Referring Phys: [place]  1. Left ventricular ejection fraction, by estimation, is 60 to 65%. The left ventricle has normal function. The left ventricle has no regional wall motion abnormalities. Left ventricular diastolic parameters are consistent with Grade I diastolic dysfunction (impaired relaxation).  2. Right ventricular systolic function is normal. The right ventricular size is normal. Tricuspid regurgitation signal is inadequate for assessing PA pressure.  3. The mitral valve is normal in structure. Trivial mitral valve regurgitation.  4. The aortic valve is tricuspid. There is mild calcification of the aortic valve. There is mild thickening of the aortic valve. Aortic valve regurgitation is not visualized.  5. The inferior vena cava is normal in size with <50% respiratory variability, suggesting right atrial pressure of 8 mmHg. Comparison(s): No prior Echocardiogram. FINDINGS  Left Ventricle: Left ventricular ejection fraction, by estimation, is 60 to 65%. The left ventricle has normal function. The left ventricle has no regional wall motion abnormalities. The left ventricular internal cavity size was normal in size. There is  no left ventricular hypertrophy. Left ventricular diastolic parameters are consistent with Grade I  diastolic dysfunction (impaired relaxation). Right Ventricle: The right ventricular size is normal. No increase in right ventricular wall thickness. Right ventricular systolic function is normal. Tricuspid regurgitation signal is inadequate for assessing PA pressure. Left Atrium: Left atrial size was normal in size. Right Atrium: Right atrial size was normal in size. Pericardium: Ascites is present. There is no evidence of pericardial effusion. Mitral Valve: The mitral valve is normal in structure. There is mild thickening of the mitral valve leaflet(s). There is mild calcification of the mitral valve leaflet(s). Mild mitral annular calcification. Trivial mitral valve regurgitation. Tricuspid Valve: The tricuspid valve is normal in structure. Tricuspid valve regurgitation is trivial. Aortic Valve: The aortic valve is tricuspid. There is mild calcification of the aortic valve. There is mild thickening of the aortic valve. Aortic valve regurgitation is not visualized. Aortic valve peak gradient measures 6.1 mmHg. Pulmonic Valve: The pulmonic valve was normal in structure. Pulmonic valve regurgitation is trivial. Aorta: The aortic root is normal in size and structure. Venous: The inferior vena cava is normal in size with less than 50% respiratory variability, suggesting right atrial pressure of 8 mmHg. IAS/Shunts: The atrial septum is grossly normal.  LEFT VENTRICLE PLAX 2D LVIDd:         3.60 cm     Diastology LVIDs:         2.40 cm     LV e' medial:    6.31 cm/s LV PW:         0.80 cm     LV E/e' medial:  13.9 LV IVS:        1.00 cm     LV e' lateral:   8.16 cm/s LVOT diam:     2.00 cm     LV E/e' lateral: 10.7 LV SV:         57 LV SV Index:   39 LVOT Area:     3.14 cm  LV Volumes (MOD) LV vol d, MOD A2C: 55.8 ml LV vol d, MOD A4C: 57.5 ml LV vol s, MOD A2C: 20.8 ml LV vol s, MOD A4C: 22.2 ml LV SV MOD A2C:     35.0 ml LV SV MOD A4C:     57.5 ml LV SV MOD BP:      35.8 ml RIGHT VENTRICLE  IVC RV Basal  diam:  2.50 cm     IVC diam: 1.40 cm RV Mid diam:    2.10 cm RV S prime:     12.60 cm/s TAPSE (M-mode): 1.9 cm LEFT ATRIUM           Index        RIGHT ATRIUM          Index LA diam:      2.90 cm 1.98 cm/m   RA Area:     7.85 cm LA Vol (A2C): 20.3 ml 13.85 ml/m  RA Volume:   14.10 ml 9.62 ml/m LA Vol (A4C): 22.5 ml 15.35 ml/m  AORTIC VALVE AV Area (Vmax): 2.51 cm AV Vmax:        123.00 cm/s AV Peak Grad:   6.1 mmHg LVOT Vmax:      98.30 cm/s LVOT Vmean:     66.400 cm/s LVOT VTI:       0.182 m  AORTA Ao Root diam: 2.90 cm MITRAL VALVE MV Area (PHT): 4.80 cm     SHUNTS MV Decel Time: 158 msec     Systemic VTI:  0.18 m MV E velocity: 87.50 cm/s   Systemic Diam: 2.00 cm MV A velocity: 107.00 cm/s MV E/A ratio:  0.82 Gwyndolyn Kaufman MD Electronically signed by Gwyndolyn Kaufman MD Signature Date/Time: 07/27/2021/2:40:17 PM    Final    [place] ASCITES (ABDOMEN LIMITED)  Result Date: 07/25/2021 CLINICAL DATA:  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 COMPARISON:  07/01/2021 FINDINGS: Survey of the abdominal 4 quadrants demonstrates a very small amount of abdominopelvic ascites. There is not enough fluid to warrant therapeutic paracentesis. Procedure not performed. IMPRESSION: Trace abdominopelvic ascites. Electronically Signed   By: Jerilynn Mages.  Shick M.D.   On: 07/25/2021 15:03   [place] CORE BIOPSY (LIVER)  Result Date: 07/11/2021 Criselda Peaches, MD     07/11/2021  2:52 PM Interventional Radiology Procedure Note Procedure: US guided liver bx Complications: None Estimated Blood Loss: None Recommendations: - Bedrest x 2 hrs - DC home Signed, Criselda Peaches, MD   [place] EKG SITE RITE  Result Date: 07/27/2021 If Site Rite image not attached, placement could not be confirmed due to current cardiac rhythm.    Future Appointments  Date Time Provider  [place]  08/04/2021 10:15 AM [facility] None  08/04/2021 10:40 AM Truitt Merle, MD [facility] None  08/04/2021 11:30 AM [facility] INFUSION [facility] None  08/05/2021  1:30 PM [facility] INFUSION [facility] None  08/06/2021  2:30 PM [facility] INFUSION [facility] None  08/08/2021  1:30 PM [facility] [place] [facility] None  08/11/2021 12:30 PM [facility] 2 [facility] [place]      LOS: 4 days    Addendum  I have seen the patient, examined her. I agree with the assessment and and plan and have edited the notes.   Meredith Goodman remains to be critically ill, she is sedated, but does wake up and try to communicated. I mainly spoke with her daughter and son. 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 I recommend DNR, her daughter and son would like to think about it and discuss with their third sibling. Will continue all medical treatment for now, and see how things go, before we talk to them about comfort care if she  get worse further. I will f/u on Wednesday.   Truitt Merle  07/28/2021

## 2021-07-29 ENCOUNTER — Inpatient Hospital Stay (HOSPITAL_COMMUNITY): Payer: Commercial Managed Care - HMO

## 2021-07-29 DIAGNOSIS — L299 Pruritus, unspecified: Secondary | ICD-10-CM

## 2021-07-29 DIAGNOSIS — E43 Unspecified severe protein-calorie malnutrition: Secondary | ICD-10-CM

## 2021-07-29 LAB — COMPREHENSIVE METABOLIC PANEL
ALT: 69 U/L — ABNORMAL HIGH (ref 0–44)
AST: 123 U/L — ABNORMAL HIGH (ref 15–41)
Albumin: 2.1 g/dL — ABNORMAL LOW (ref 3.5–5.0)
Alkaline Phosphatase: 542 U/L — ABNORMAL HIGH (ref 38–126)
Anion gap: 7 (ref 5–15)
BUN: 36 mg/dL — ABNORMAL HIGH (ref 8–23)
CO2: 21 mmol/L — ABNORMAL LOW (ref 22–32)
Calcium: 8.2 mg/dL — ABNORMAL LOW (ref 8.9–10.3)
Chloride: 118 mmol/L — ABNORMAL HIGH (ref 98–111)
Creatinine, Ser: 1.05 mg/dL — ABNORMAL HIGH (ref 0.44–1.00)
GFR, Estimated: 59 mL/min — ABNORMAL LOW (ref >60)
Glucose, Bld: 171 mg/dL — ABNORMAL HIGH (ref 70–99)
Potassium: 3.2 mmol/L — ABNORMAL LOW (ref 3.5–5.1)
Sodium: 146 mmol/L — ABNORMAL HIGH (ref 135–145)
Total Bilirubin: 10.5 mg/dL — ABNORMAL HIGH (ref 0.3–1.2)
Total Protein: 5 g/dL — ABNORMAL LOW (ref 6.5–8.1)

## 2021-07-29 LAB — CBC WITH DIFFERENTIAL/PLATELET
Abs Immature Granulocytes: 0.33 10*3/uL — ABNORMAL HIGH (ref 0.00–0.07)
Basophils Absolute: 0 10*3/uL (ref 0.0–0.1)
Basophils Relative: 0 %
Eosinophils Absolute: 0 10*3/uL (ref 0.0–0.5)
Eosinophils Relative: 0 %
HCT: 23.6 % — ABNORMAL LOW (ref 36.0–46.0)
Hemoglobin: 8.1 g/dL — ABNORMAL LOW (ref 12.0–15.0)
Immature Granulocytes: 6 %
Lymphocytes Relative: 33 %
Lymphs Abs: 1.8 10*3/uL (ref 0.7–4.0)
MCH: 30.1 pg (ref 26.0–34.0)
MCHC: 34.3 g/dL (ref 30.0–36.0)
MCV: 87.7 fL (ref 80.0–100.0)
Monocytes Absolute: 0.7 10*3/uL (ref 0.1–1.0)
Monocytes Relative: 13 %
Neutro Abs: 2.7 10*3/uL (ref 1.7–7.7)
Neutrophils Relative %: 48 %
Platelets: 61 10*3/uL — ABNORMAL LOW (ref 150–400)
RBC: 2.69 MIL/uL — ABNORMAL LOW (ref 3.87–5.11)
RDW: 20.4 % — ABNORMAL HIGH (ref 11.5–15.5)
WBC: 5.7 10*3/uL (ref 4.0–10.5)
nRBC: 14.7 % — ABNORMAL HIGH (ref 0.0–0.2)

## 2021-07-29 LAB — CULTURE, BLOOD (ROUTINE X 2)
Culture: NO GROWTH
Culture: NO GROWTH

## 2021-07-29 LAB — GLUCOSE, CAPILLARY
Glucose-Capillary: 187 mg/dL — ABNORMAL HIGH (ref 70–99)
Glucose-Capillary: 158 mg/dL — ABNORMAL HIGH (ref 70–99)
Glucose-Capillary: 121 mg/dL — ABNORMAL HIGH (ref 70–99)
Glucose-Capillary: 133 mg/dL — ABNORMAL HIGH (ref 70–99)
Glucose-Capillary: 109 mg/dL — ABNORMAL HIGH (ref 70–99)
Glucose-Capillary: 103 mg/dL — ABNORMAL HIGH (ref 70–99)

## 2021-07-29 LAB — PROTIME-INR
INR: 1.4 — ABNORMAL HIGH (ref 0.8–1.2)
Prothrombin Time: 17.5 seconds — ABNORMAL HIGH (ref 11.4–15.2)

## 2021-07-29 LAB — MAGNESIUM: Magnesium: 2.1 mg/dL (ref 1.7–2.4)

## 2021-07-29 LAB — PHOSPHORUS: Phosphorus: 2 mg/dL — ABNORMAL LOW (ref 2.5–4.6)

## 2021-07-29 MED ORDER — POTASSIUM PHOSPHATES 15 MMOLE/5ML IV SOLN
15.0000 mmol | Freq: Once | INTRAVENOUS | Status: DC
  Administered 2021-07-29: 15 mmol via INTRAVENOUS
  Filled 2021-07-29: qty 5

## 2021-07-29 MED ORDER — SODIUM PHOSPHATES 45 MMOLE/15ML IV SOLN: 30.0000 mmol | INTRAVENOUS | Status: DC

## 2021-07-29 MED ORDER — METOPROLOL TARTRATE 5 MG/5ML IV SOLN: 5.0000 mg | Freq: Four times a day (QID) | INTRAVENOUS | Status: DC | PRN

## 2021-07-29 MED ORDER — POTASSIUM CHLORIDE 20 MEQ PO PACK: 40.0000 meq | PACK | Freq: Two times a day (BID) | ORAL | Status: DC

## 2021-07-29 MED ORDER — POTASSIUM CHLORIDE 20 MEQ PO PACK
40.0000 meq | PACK | Freq: Two times a day (BID) | ORAL | Status: DC
Start: 2021-07-29 — End: 2021-07-30
  Administered 2021-07-29: 40 meq
  Filled 2021-07-29: qty 2

## 2021-07-29 MED ORDER — METOPROLOL TARTRATE 5 MG/5ML IV SOLN
5.0000 mg | INTRAVENOUS | Status: DC
Start: 2021-07-29 — End: 2021-07-29

## 2021-07-29 MED ORDER — ATENOLOL 25 MG PO TABS: 12.5000 mg | ORAL_TABLET | Freq: Every day | ORAL | Status: DC

## 2021-07-29 MED ORDER — METOPROLOL TARTRATE 5 MG/5ML IV SOLN
2.5000 mg | INTRAVENOUS | Status: AC
Start: 2021-07-29 — End: 2021-07-29
  Administered 2021-07-29: 2.5 mg via INTRAVENOUS
  Filled 2021-07-29: qty 5

## 2021-07-29 MED ORDER — POTASSIUM CHLORIDE 20 MEQ PO PACK
40.0000 meq | PACK | Freq: Once | ORAL | Status: AC
  Administered 2021-07-29: 40 meq
  Filled 2021-07-29: qty 2

## 2021-07-29 MED ORDER — MORPHINE SULFATE 10 MG/5ML PO SOLN
10.0000 mg | Freq: Four times a day (QID) | ORAL | Status: DC
  Administered 2021-07-29 (×2): 10 mg
  Filled 2021-07-29 (×2): qty 5

## 2021-07-29 MED ORDER — DEXTROSE 5 % IV SOLN
30.0000 mmol | Freq: Once | INTRAVENOUS | Status: AC
  Administered 2021-07-29: 30 mmol via INTRAVENOUS
  Filled 2021-07-29: qty 10

## 2021-07-29 MED ORDER — HYDROXYZINE HCL 25 MG PO TABS: 25.0000 mg | ORAL_TABLET | Freq: Three times a day (TID) | ORAL | Status: DC | PRN

## 2021-07-29 NOTE — Progress Notes (Signed)
PROGRESS NOTE  Meredith Goodman ZOX:[id number] DOB: [birth year] DOA: 07/24/2021 PCP: Pcp, No  HPI/Recap of past 24 hours: Meredith Goodman is a 66 y.o. 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    Work-up revealed hepatic encephalopathy, acute hypoxic respiratory failure secondary to right upper lobe pneumonia versus right upper lobe lung mass, decompensated liver failure, coagulopathy.  Seen by medical oncology and GI.  Patient has a very poor prognosis, therefore palliative care was consulted.    UDS positive for opiates and cocaine on 07/24/2021.  Hospital course complicated by agitation delirium for which she was started on Precedex drip and transferred to ICU.  PICC line and NG tube placed on 07/27/2021.  07/29/2021: Seen and examined at her bedside.  She is sedated on Precedex however she arouses to voices.  Denies having any pain.  Her abdomen is distended.    Assessment/Plan: Principal Problem:   Acute respiratory failure with hypoxia (HCC) Active Problems:   Protein-calorie malnutrition, severe  Persistent acute hypoxic respiratory failure secondary to lung mass versus infiltrate seen on CT scan Currently on 2 L from 4 L on admission. Not on oxygen supplementation at baseline. Maintain O2 saturation greater than 90% Wean off oxygen supplementation as tolerated when hemodynamically stable. Continue broad spectrum IV antibiotics coverage for now, she is on Zosyn, day #4.   Agitation delirium/acute hepatic encephalopathy, sedated on Precedex drip. Ammonia level elevated on presentation, on lactulose via tube Goal BM 2 to 4 loose stools per day Diagnostic paracentesis could not be performed by IR, not enough intra-abdominal fluid on 07/25/2021.   Due to agitation  delirium, she was started on Precedex on 07/26/2021.  PCCM assisting with the management.  Refractory hypokalemia, likely from GI losses Daily potassium replacement via tube Daily CMP Replete electrolytes as indicated Continue tube feedings  Hypophosphatemia Serum phosphorus 2.0 Repleted intravenously Repeat level in the morning Continue tube feedings  Mild non-anion gap metabolic acidosis Serum bicarb 21 Anion gap 7 Closely monitor and replace electrolytes as indicated.  Hypernatremia, acute Serum sodium is downtrending 146 from 147 Continue free water flushes Continue to monitor serum sodium level  Improving leukopenia/neutropenia Seen by medical oncology WBC, neutrophil count improved Continue empiric IV antibiotics Monitor fever curve and WBC.  Worsening elevated liver chemistries in the setting of liver metastasis. Alkaline phosphatase, AST ALT up-trending Liver mets with poor prognosis  Hyperbilirubinemia T bilirubin peaked at 14.2 Downtrending to 10.5  AKI likely secondary to intravascular volume depletion Baseline creatinine 0.7 with GFR greater than 60 Creatinine 1.05 with GFR of 59 Closely monitor renal function in the setting of advanced liver disease Overall prognosis is poor with renal involvement  Severe protein calorie malnutrition BMI 19 Severe muscle mass loss Minimally responsive NG tube feeding, dietitian consulted to initiate trickle feeds Continue aspiration precautions  Acute blood loss anemia, rule out intra-abdominal bleed GI consulted Hemoglobin dropped to 6.0, transfused 1 unit on 07/25/2021, repeat hemoglobin 10.6 on 07/26/2021 Hemoglobin 8.1 from 8.3. Goal hemoglobin between 7 and 9 Continue to monitor H&H   Sepsis, suspect intra-abdominal source Leukopenia 1.5, tachycardia 124 Leukopenia has resolved, WBC 5.7 IR paracentesis with fluid analysis was ordered but there was not enough fluid to be tapped. Follow blood cultures and  urine culture Received IV fluid in the ED  and in the ICU due to hypotension and n.p.o. status Started on IV cefepime and IV vancomycin in the ED, these were DC'd and replaced by IV Zosyn on 07/26/2021. MRSA screening test negative, IV vancomycin DC'd on 07/25/2021. Continue to monitor fever curve and WBC Maintain MAP greater than 65 IV fluid D5 half-normal saline DC'd on 07/29/2021, no hypoglycemia, no hypotension.   Small abdominal ascites in the setting of recently diagnosed mets to the liver IR consulted for paracentesis diagnostic and therapeutic Rule out SBP, this could not be performed, not enough intra-abdominal fluid.   Treated coagulopathy in the setting of advanced liver disease INR 2.1> 1.4>1.4> 1.5> 1.4. Received a dose of IV vitamin K 5 mg.  Uncontrolled hypertension Hypertensive on 07/29/2021, IV fluid DC'd. Precedex also DC'd.  Chronic thrombocytopenia in the setting of advanced liver disease Received 1 unit of platelet on 07/25/2021 for platelet count of 22> 50>38K> 61,000   Improving pancytopenia likely secondary to recent chemotherapy WBC 5.7, platelet count 61, hemoglobin 8.1. Seen by medical oncology.  Chronic pain syndrome Minimize use of narcotics and sedating agents in the setting of advanced liver disease. Closely monitor in the ICU  Polysubstance abuse including cocaine and alcohol. TOC consulted to provide resources for polysubstance cessation CIWA protocol, Precedex drip stopped on 07/29/2021  Goals of care Palliative care team consulted to assist with establishing goals of care Very poor prognosis in the setting of advanced cancer, decompensated advanced liver disease, liver metastasis, ongoing intermittent alcohol use, drug use. Made DNR on 07/29/2021  Critical care time: 65 minutes     DVT prophylaxis: SCDs due to high risk of bleeding   Code Status: DNR on 07/29/2021   Family Communication: Daughter is at bedside.  Updated.   Disposition Plan:  Admitted to stepdown unit   Consults called: Medical oncology, palliative care team, PCCM.   Admission status: Inpatient status     Status is: Inpatient   Patient requires at least 2 midnights for further evaluation and treatment of present condition.                Objective: Vitals:   07/29/21 1300 07/29/21 1400 07/29/21 1431 07/29/21 1500  BP: (!) 184/94 (!) 211/100  (!) 165/81  Pulse: 82 (!) 114 (!) 103 90  Resp: 16 (!) 25 (!) 26 (!) 24  Temp:      TempSrc:      SpO2: 94% 94% 96% 96%  Weight:        Intake/Output Summary (Last 24 hours) at 07/29/2021 1513 Last data filed at 07/29/2021 1400 Gross per 24 hour  Intake 1073.89 ml  Output 1195 ml  Net -121.11 ml   Filed Weights   07/24/21 1900 07/28/21 0500 07/29/21 0500  Weight: 48.3 kg 49.6 kg 49 kg    Exam:  General: 66 y.o. year-old female frail-appearing in no acute distress.  She is sedated on Precedex this morning but easily arousable to voices and answers to questions appropriately.   Cardiovascular: Regular rate and rhythm no rubs or gallops.   Respiratory: Mild rales at bases no wheezing noted.  Poor inspiratory effort.   Abdomen: Distended abdomen with bowel sounds present.  Nontender with palpation. Skin: No ulcerative lesions noted.   Psychiatry: Mood is appropriate for condition and setting. Neuro: Moves all 4 extremities.  Nonfocal exam. Musculoskeletal: No lower extremity edema bilaterally.  Data Reviewed: CBC: Recent Labs  Lab 07/25/21 0253 07/25/21 3244 07/25/21 1438 07/26/21 0253 07/27/21 0251 07/28/21 0008 07/29/21 0102  WBC 1.3* 1.1*  --  1.5* 1.3* 3.4* 5.7  NEUTROABS 0.4*  --   --  0.3* 0.2* 1.1* 2.7  HGB 7.1* 6.4* 9.3* 10.6* 8.1* 8.3* 8.1*  HCT 20.2* 18.5* 25.8* 28.9* 22.3* 23.2* 23.6*  MCV 87.8 88.9  --  83.3 84.2 85.3 87.7  PLT 23* 22*  --  50* 38* 53* 61*   Basic Metabolic Panel: Recent Labs  Lab 07/25/21 0253 07/26/21 0253 07/27/21 0251 07/28/21 0327 07/29/21 0609   NA 138 141 144 147* 146*  K 4.0 3.7 2.6* 4.2 3.2*  CL 113* 113* 112* 119* 118*  CO2 16* 19* 23 20* 21*  GLUCOSE 79 136* 157* 158* 171*  BUN 36* 33* 29* 38* 36*  CREATININE 0.78 0.84 0.90 1.23* 1.05*  CALCIUM 7.6* 8.3* 8.0* 8.1* 8.2*  MG 2.0 1.7 2.1 2.2 2.1  PHOS 4.2 3.2 3.4 2.5 2.0*   GFR: Estimated Creatinine Clearance: 41.9 mL/min (A) (by C-G formula based on SCr of 1.05 mg/dL (H)). Liver Function Tests: Recent Labs  Lab 07/25/21 0253 07/26/21 0253 07/27/21 0251 07/28/21 0327 07/29/21 0609  AST 103* 89* 64* 89* 123*  ALT 60* 59* 50* 56* 69*  ALKPHOS 451* 421* 359* 465* 542*  BILITOT 13.3* 14.2* 11.6* 11.0* 10.5*  PROT 4.8* 5.5* 4.8* 4.8* 5.0*  ALBUMIN 2.0* 2.5* 2.1* 2.1* 2.1*   No results for input(s): LIPASE, AMYLASE in the last 168 hours. Recent Labs  Lab 07/24/21 1412 07/25/21 0713 07/28/21 0327  AMMONIA 57* 94* 97*   Coagulation Profile: Recent Labs  Lab 07/25/21 0253 07/26/21 0345 07/27/21 0251 07/28/21 0327 07/29/21 0609  INR 2.4* 1.4* 1.4* 1.5* 1.4*   Cardiac Enzymes: Recent Labs  Lab 07/27/21 0251  CKTOTAL 80  CKMB 2.1   BNP (last 3 results) No results for input(s): PROBNP in the last 8760 hours. HbA1C: Recent Labs    07/28/21 0327  HGBA1C 6.6*   CBG: Recent Labs  Lab 07/28/21 2047 07/28/21 2338 07/29/21 0407 07/29/21 0829 07/29/21 1232  GLUCAP 108* 178* 187* 158* 121*   Lipid Profile: No results for input(s): CHOL, HDL, LDLCALC, TRIG, CHOLHDL, LDLDIRECT in the last 72 hours. Thyroid Function Tests: No results for input(s): TSH, T4TOTAL, FREET4, T3FREE, THYROIDAB in the last 72 hours. Anemia Panel: No results for input(s): VITAMINB12, FOLATE, FERRITIN, TIBC, IRON, RETICCTPCT in the last 72 hours. Urine analysis:    Component Value Date/Time   COLORURINE YELLOW 07/01/2021 [place] 07/01/2021 1613   LABSPEC 1.014 07/01/2021 1613   PHURINE 7.0 07/01/2021 1613   GLUCOSEU NEGATIVE 07/01/2021 1613   HGBUR  SMALL (A) 07/01/2021 1613   BILIRUBINUR NEGATIVE 07/01/2021 1613   KETONESUR NEGATIVE 07/01/2021 1613   PROTEINUR NEGATIVE 07/01/2021 1613   UROBILINOGEN 0.2 12/26/2016 1259   NITRITE NEGATIVE 07/01/2021 1613   LEUKOCYTESUR NEGATIVE 07/01/2021 1613   Sepsis Labs: @LABRCNTIP (procalcitonin:4,lacticidven:4)  ) Recent Results (from the past 240 hour(s))  Urine Culture     Status: Abnormal   Collection Time: 07/24/21  1:49 PM   Specimen: In/Out Cath Urine  Result Value Ref Range Status   Specimen Description   Final    IN/OUT CATH URINE Performed at [facility], [place] [address], [place], [place] [address]    Special Requests   Final    Immunocompromised Performed at [facility], [place] [address], [place], [place] [address]    Culture MULTIPLE SPECIES PRESENT, SUGGEST RECOLLECTION (A)  Final   Report Status 07/26/2021 FINAL  Final  Blood Culture (routine x 2)     Status: None   Collection Time: 07/24/21  2:20 PM   Specimen: BLOOD  Result Value Ref Range Status   Specimen Description   Final    BLOOD BLOOD LEFT FOREARM Performed at [place] [address], [place], [place] [address]    Special Requests   Final    BOTTLES DRAWN AEROBIC AND ANAEROBIC Blood Culture results may not be optimal due to an inadequate volume of blood received in culture bottles Performed at [place] [address] [place], [place] [address]    Culture   Final    NO GROWTH 5 DAYS Performed at [facility], [place] [address], [place], [place] [address]    Report Status 07/29/2021 FINAL  Final  Blood Culture (routine x 2)     Status: None   Collection Time: 07/24/21  2:25 PM   Specimen: BLOOD  Result Value Ref Range Status   Specimen Description   Final    BLOOD BLOOD RIGHT FOREARM Performed at [place] [address], [place], [place] [address]    Special Requests   Final    BOTTLES  DRAWN AEROBIC AND ANAEROBIC Blood Culture results may not be optimal due to an inadequate volume of blood received in culture bottles Performed at [place] [address], [place], [place] [address]    Culture   Final    NO GROWTH 5 DAYS Performed at [facility], [place] [address] [place],  [address]    Report Status 07/29/2021 FINAL  Final  Resp Panel by RT-PCR (Flu A&B, Covid) Nasopharyngeal Swab     Status: None   Collection Time: 07/24/21  2:36 PM   Specimen: Nasopharyngeal Swab; Nasopharyngeal(NP) swabs in vial transport medium  Result Value Ref Range Status   SARS Coronavirus 2 by RT PCR NEGATIVE NEGATIVE Final    Comment: (NOTE) SARS-CoV-2 target nucleic acids are NOT DETECTED.  The SARS-CoV-2 RNA is generally detectable in upper respiratory specimens during the acute phase of infection. The lowest concentration of SARS-CoV-2 viral copies this assay can detect is 138 copies/mL. A negative result does not preclude SARS-Cov-2 infection and should not be used as the sole basis for treatment or other patient management decisions. A negative result may occur with  improper specimen collection/handling, submission of specimen other than nasopharyngeal swab, presence of viral mutation(s) within the areas targeted by this assay, and inadequate number of viral copies(<138 copies/mL). A negative result must be combined with clinical observations, patient history, and epidemiological information. The expected result is Negative.  Fact Sheet for Patients:  EntrepreneurPulse.com.au  Fact Sheet for Healthcare Providers:  IncredibleEmployment.be  This test is no t yet approved or cleared by the [place] FDA and  has been authorized for detection and/or diagnosis of SARS-CoV-2 by FDA under an Emergency Use Authorization (EUA). This EUA will remain  in effect (meaning this test can be used) for the duration of  the COVID-19 declaration under Section 564(b)(1) of the Act, 21 U.S.C.section 360bbb-3(b)(1), unless the authorization is terminated  or revoked sooner.       Influenza A by PCR NEGATIVE NEGATIVE Final   Influenza B by PCR NEGATIVE NEGATIVE Final    Comment: (NOTE) The Xpert Xpress SARS-CoV-2/FLU/RSV plus assay is intended as an aid in the diagnosis of influenza from Nasopharyngeal swab specimens and should not be used as a sole basis for treatment. Nasal  washings and aspirates are unacceptable for Xpert Xpress SARS-CoV-2/FLU/RSV testing.  Fact Sheet for Patients: EntrepreneurPulse.com.au  Fact Sheet for Healthcare Providers: IncredibleEmployment.be  This test is not yet approved or cleared by the [place] FDA and has been authorized for detection and/or diagnosis of SARS-CoV-2 by FDA under an Emergency Use Authorization (EUA). This EUA will remain in effect (meaning this test can be used) for the duration of the COVID-19 declaration under Section 564(b)(1) of the Act, 21 U.S.C. section 360bbb-3(b)(1), unless the authorization is terminated or revoked.  Performed at [facility], [place] [address], [place], [place] [address]   MRSA Next Gen by PCR, Nasal     Status: None   Collection Time: 07/24/21  6:56 PM   Specimen: Nasal Mucosa; Nasal Swab  Result Value Ref Range Status   MRSA by PCR Next Gen NOT DETECTED NOT DETECTED Final    Comment: (NOTE) The GeneXpert MRSA Assay (FDA approved for NASAL specimens only), is one component of a comprehensive MRSA colonization surveillance program. It is not intended to diagnose MRSA infection nor to guide or monitor treatment for MRSA infections. Test performance is not FDA approved in patients less than 45 years old. Performed at [facility], [place] [address] [place], [place] [address]       Studies: No results found.  Scheduled Meds:  Chlorhexidine  Gluconate Cloth  6 each Topical Daily   feeding supplement (PROSource TF)  45 mL Per Tube Daily   folic acid  1 mg Per Tube Daily   free water  200 mL Per Tube Q4H   insulin aspart  0-9 Units Subcutaneous Q4H   lactulose  30 g Per Tube BID   mouth rinse  15 mL Mouth Rinse BID   midodrine  10 mg Per Tube TID WC   morphine  10 mg Per Tube Q6H   multivitamin with minerals  1 tablet Per Tube Daily   pantoprazole sodium  40 mg Per Tube QHS   sodium chloride flush  10-40 mL Intracatheter Q12H   thiamine  100 mg Per Tube Daily   Or   thiamine  100 mg Intravenous Daily    Continuous Infusions:  sodium chloride Stopped (07/28/21 0342)   dextrose 5 % and 0.45% NaCl Stopped (07/27/21 0923)   feeding supplement (OSMOLITE 1.5 CAL) 1,000 mL (07/28/21 2031)   piperacillin-tazobactam (ZOSYN)  IV 12.5 mL/hr at 07/29/21 1400   potassium PHOSPHATE IVPB (in mmol)       LOS: 5 days     Kayleen Memos, MD [facility] Pager [phone]  If 7PM-7AM, please contact night-coverage [email or web address] Password [facility] 07/29/2021, 3:13 PM

## 2021-07-29 NOTE — Progress Notes (Signed)
Pharmacy: electrolyte replacement K 3.2 Phos 2  Plan: Kphos 15 mM & Kcl 40 per tube x 1 per elink protocol  Eudelia Bunch, Pharm.D 07/29/2021 2:07 PM

## 2021-07-29 NOTE — Plan of Care (Signed)
°  Interdisciplinary Goals of Care Family Meeting   Date carried out:: 07/29/2021  Location of the meeting: Bedside  Member's involved: Nurse Practitioner and Family Member or next of kin - daughter, Meredith Goodman Power of Attorney or acting medical decision maker: Meredith Goodman, daughter     Discussion: We discussed goals of care for Meredith Goodman .  Reviewed patient status, events surrounding hospitalization, patients disease progression despite therapy and inability to receive further chemotherapy.  Daughter expresses the family wants to make sure the patient is comfortable.  They have had prior discussions with the Palliative Care service in detail.  Agrees that CPR, mechanical ventilation and pressors are not in patients best interest. We discussed continuing current supportive measures, focusing on pain / symptom control.  Meredith Goodman would be ok with "treating the treatable" but understands that her mother will eventually decline and will succumb to her cancer.  We reviewed that we will continue to care for her through the dying process. Support offered to family.    Code status: Full DNR  Disposition: Continue current supportive measures for now. As patient transitions, primary focus on comfort measures.   Time spent for the meeting: 20 minutes.     Meredith Gens, MSN, APRN, NP-C, AGACNP-BC [facility] 07/29/2021, 12:25 PM   Please see [email or web address] for pager details.   From 7A-7P if no response, please call [phone] After hours, please call ELink [phone]

## 2021-07-29 NOTE — Progress Notes (Signed)
Daily Progress Note   Patient Name: Meredith Goodman       Date: 07/29/2021 DOB: [birth year]  Age: 66 y.o. MRN#: [id number] Attending Physician: Kayleen Memos, DO Primary Care Physician: Pcp, No Admit Date: 07/24/2021  Reason for Consultation/Follow-up: Establishing goals of care  Subjective: Resting in bed, no distress, I met with her family outside the room. Also discussed with PCCM colleagues, see below.      Questions and concerns addressed.   PMT will continue to support holistically.  Length of Stay: 5  Current Medications: Scheduled Meds:   Chlorhexidine Gluconate Cloth  6 each Topical Daily   feeding supplement (PROSource TF)  45 mL Per Tube Daily   folic acid  1 mg Per Tube Daily   free water  200 mL Per Tube Q4H   insulin aspart  0-9 Units Subcutaneous Q4H   lactulose  30 g Per Tube BID   mouth rinse  15 mL Mouth Rinse BID   midodrine  10 mg Per Tube TID WC   morphine  10 mg Per Tube Q6H   multivitamin with minerals  1 tablet Per Tube Daily   pantoprazole sodium  40 mg Per Tube QHS   potassium chloride  40 mEq Per Tube BID   sodium chloride flush  10-40 mL Intracatheter Q12H   thiamine  100 mg Per Tube Daily   Or   thiamine  100 mg Intravenous Daily    Continuous Infusions:  sodium chloride Stopped (07/28/21 0342)   feeding supplement (OSMOLITE 1.5 CAL) 1,000 mL (07/28/21 2031)   piperacillin-tazobactam (ZOSYN)  IV 12.5 mL/hr at 07/29/21 1400   potassium PHOSPHATE IVPB (in mmol)      PRN Meds: sodium chloride, fentaNYL (SUBLIMAZE) injection, hydrOXYzine, lip balm, metoprolol tartrate, oxyCODONE, sodium chloride flush  Physical Exam     General: Sleepy but arousable.  No distress.  Chronically ill-appearing HEENT: No bruits, no goiter, no  JVD Heart: Regular rate and rhythm. No murmur appreciated. Lungs: Good air movement, clear Abdomen:distended, positive bowel sounds.   Ext: No significant edema Skin: Warm and dry Neuro: On Precedex      Vital Signs: BP (!) 165/81    Pulse 90    Temp 97.6 F (36.4 C) (Axillary)    Resp (!) 24    Wt 49 kg  SpO2 96%    BMI 19.76 kg/m  SpO2: SpO2: 96 % O2 Device: O2 Device: Nasal Cannula O2 Flow Rate: O2 Flow Rate (L/min): 2 L/min  Intake/output summary:  Intake/Output Summary (Last 24 hours) at 07/29/2021 1609 Last data filed at 07/29/2021 1400 Gross per 24 hour  Intake 1056.49 ml  Output 985 ml  Net 71.49 ml    LBM: Last BM Date: 07/29/21 Baseline Weight: Weight: 48.3 kg Most recent weight: Weight: 49 kg       Palliative Assessment/Data:    Flowsheet Rows    Flowsheet Row Most Recent Value  Intake Tab   Referral Department Hospitalist  Unit at Time of Referral ICU  Palliative Care Primary Diagnosis Cancer  Date Notified 07/25/21  Palliative Care Type New Palliative care  Reason for referral Clarify Goals of Care  Date of Admission 07/24/21  Date first seen by Palliative Care 07/26/21  # of days Palliative referral response time 1 Day(s)  # of days IP prior to Palliative referral 1  Clinical Assessment   Palliative Performance Scale Score 10%  Psychosocial & Spiritual Assessment   Palliative Care Outcomes   Patient/Family meeting held? 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 female  with  past medical history of hypertension, carcinoid tumor of the ileum, small cell lung cancer, recent discovery of liver mets who recently started on chemotherapy with Dr. Burr Medico admitted on 07/24/2021 with shortness of breath.  CTA ruled out PE but showed right upper lobe mass versus pneumonia.  Her mental status has declined and she was obtunded but then became very combative and currently on Precedex infusion.  She was evaluated by GI due to decompensated liver with worsening pancytopenia and concern for hepatic encephalopathy.  Dr. Burr Medico evaluated her and discussed poor prognosis with patient and her son.  Recommendation for DNR/DNI was made.  She does remain a full code at this time.  Noted to have found white rock substance in her room and UDS was positive for cocaine.  Precedex started 1/14 due to continued agitation encephalopathy.    Recommendations/Plan: Discussed with PCCM colleagues and also with family outside the room, agree with DNR and current measures, monitor hospital course, focusing on pain and non pain symptom management.  Palliative care to continue to follow.   Code Status: now DNR 07-29-21.     Code Status Orders  (From admission, onward)           Start     Ordered   07/24/21 1728  Full code  Continuous        07/24/21 1727           Code Status History     This patient has a current code status but no historical code status.       Prognosis: Guarded  Discharge Planning: To Be Determined  Care plan was discussed with IDT  Thank you for allowing the Palliative Medicine Team to assist in the care of this patient.   Time In: 1500 Time  Out: 1525 Total Time 25 Prolonged Time Billed No   Loistine Chance, MD  Please contact Palliative Medicine Team phone at [phone] for questions and concerns.

## 2021-07-29 NOTE — Progress Notes (Signed)
NAME:  Meredith Goodman, MRN:  [id number], DOB:  [birth year], LOS: 5 ADMISSION DATE:  07/24/2021, CONSULTATION DATE: 1/14  REFERRING MD:  Dr Irene Pap of [facility], CHIEF COMPLAINT:  Acute Encephalopathy - agitated   BRIEF Hx:  66 year old F, smoker, alcoholic and does crack cocaine [UDS this admission positive for opioids and cocaine], along with prescription for oxycodone and MS Contin for chronic cancer-related pain with declining performance status most recently ECOG 3 on 07/21/21 oncology visit.  In 2019 she had malignant carcinoid tumor of the ileum stage III followed by right hemicolectomy at [facility] March 2019.  Then in the summer 2022 had reactive mediastinal adenopathy that by December 2022 showed extensive metastatic disease in the liver.  She completed palliative chemo cycle 1 days 1-3 on 1/5 - 1/7.  Carboplatin on day 1, etoposide on days 1-3 with GCSF on day 5, and atezolizumab q3 weeks.  Most recent oncology visit on 07/21/2021 she was found to be in the wheelchair, ongoing drinking, ongoing substance abuse nausea and vomiting and jaundice, loss of prescription opioids, cough and dyspnea.  Outpatient labs on 07/21/2021 showed anemia hemoglobin 9.3, thrombocytopenia platelet 77,000, renal insufficiency with a creatinine 1.4, jaundice with a bilirubin of 14.3 [worse since 07/17/2021 but was 1.4].  Goals of care discussion held advised DNR/DNI and no further chemo liver failure gets worse and to quit drinking alcohol.  She presented to the emergency department 1/12 with shortness of breath requiring 4 L nasal cannula, hypoxemia, acute respiratory failure pulm embolism ruled out associated acute metabolic encephalopathy on admission [sepsis and SBP suspected], worsening jaundice with a bilirubin of 16, coagulopathy with INR 2.1, leukopenia, ongoing ascites, pancytopenia.  Developed agitated delirium & was started on precedex.  There were concerns she was brought in illegal substances on 1/13.    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 Crack cocaine found in room - daughter reported it was because the patient got bad news, in a frame of mind of wanting to give up.  Lactulose started.   11/14 Agitated encephalopathy, CCM consulted. Precedex initiated.   1/16 GI signed off.  Bili slightly improved.  Remains on precedex. Palliative Care following.   Interim History / Subjective:  Afebrile  Pt reports pain in her abdomen  Remains on precedex infusion   Objective   Blood pressure (!) 144/72, pulse 89, temperature 97.8 F (36.6 C), temperature source Axillary, resp. rate (!) 21, weight 49 kg, SpO2 98 %. CVP:  [7 mmHg-13 mmHg] 11 mmHg      Intake/Output Summary (Last 24 hours) at 07/29/2021 0803 Last data filed at 07/29/2021 0636 Gross per 24 hour  Intake 1064.31 ml  Output 1545 ml  Net -480.69 ml   Filed Weights   07/24/21 1900 07/28/21 0500 07/29/21 0500  Weight: 48.3 kg 49.6 kg 49 kg    Examination: General: adult female lying in bed, chronically ill appearing  HEENT: MM pink/dry, temporal wasting, poor dentition  Neuro: generalized weakness but moves all extremities, oriented to self, place CV: s1s2 RRR, no m/r/g PULM:  non-labored, moist cough, rhonchi bilaterally  GI: soft, bsx4  active  Extremities: warm/dry, generalized 1+ pitting edema  Skin: no rashes or lesions  Resolved Hospital Problem list     Assessment & Plan:   Metastatic Small Cell (POA) Initially GI with mets to lung and liver.  Deemed not a candidate for further chemotherapy per oncology.  -continue palliative discussions -grim prognosis, sadly she has  a life limiting illness  -transition off precedex -add home morphine back for pain  -PRN oxycodone  -recommend DNR / DNI, full comfort measures   Acute Hypoxemic Respiratory Failure in setting of Metastatic Small Cell  Possible Aspiration given Encephalopathy  -O2 as needed to support sats >90% -intubation would not change patient outcome, unfortunately not a reversible process  -abx per [facility]   Hepatic Encephalopathy / Acute Metabolic Encephalopathy Hyperammonemia   Not enough fluid for paracentesis on 1/13 per IR.  Elevated ammonia due to liver dysfunction / metastatic disease. Possible withdrawal given chronic opioid use / poly substance abuse. CT head w/o acute intracranial process 1/15.  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  -supportive care, lactulose    AKI (POA) Hypokalemia  -replace K+  -Trend BMP / urinary output -Replace electrolytes as indicated -Avoid nephrotoxic agents, ensure adequate renal perfusion  Opioid Dependence in the setting of Cancer Pain  Polysubstance Abuse  -resume home scheduled opioids  -wean precedex to off  -PRN oxycodone   Hx HTN  ST  -tele  -hold home antihypertensives   Anemia / Pancytopenia  Chronic Thrombocytopenia  Post chemotherapy, hx ETOH use -trend CBC -transfuse for Hgb >7%  At Risk Hyper/Hypoglycemia  -per [facility]     Adult Failure to Thrive  Severe Protein Calorie Malnutrition  Poor mobility prior to admit. ECOG 3. Hx longstanding substance abuse & now metastatic disease.   -Palliative care appreciated  -aspiration precautions  -nutrition as able   Best practice:  Diet: NPO DVT prophylaxis: scd GI prophylaxis: ppi Glucose control: ssi   Mobility: bed rest Code Status: full Family Communication: Daughter, [name], met with palliative care 1/16.  They are to discuss as a family re goals of care. Palliative care to follow up 1/17.   Critical Care Time: 30 minutes      Noe Gens, MSN, APRN, NP-C, AGACNP-BC [facility] 07/29/2021, 8:03 AM   Please see [email or web address] for pager details.   From 7A-7P if no response, please call [phone] After hours, please call ELink [phone]

## 2021-07-30 DIAGNOSIS — I10 Essential (primary) hypertension: Secondary | ICD-10-CM | POA: Diagnosis not present

## 2021-07-30 DIAGNOSIS — F1410 Cocaine abuse, uncomplicated: Secondary | ICD-10-CM | POA: Diagnosis present

## 2021-07-30 DIAGNOSIS — Z515 Encounter for palliative care: Secondary | ICD-10-CM | POA: Diagnosis not present

## 2021-07-30 DIAGNOSIS — R531 Weakness: Secondary | ICD-10-CM

## 2021-07-30 DIAGNOSIS — C7A012 Malignant carcinoid tumor of the ileum: Secondary | ICD-10-CM | POA: Diagnosis present

## 2021-07-30 DIAGNOSIS — C3490 Malignant neoplasm of unspecified part of unspecified bronchus or lung: Secondary | ICD-10-CM | POA: Diagnosis present

## 2021-07-30 DIAGNOSIS — Z7189 Other specified counseling: Secondary | ICD-10-CM | POA: Diagnosis not present

## 2021-07-30 DIAGNOSIS — J9621 Acute and chronic respiratory failure with hypoxia: Secondary | ICD-10-CM | POA: Diagnosis not present

## 2021-07-30 DIAGNOSIS — J9601 Acute respiratory failure with hypoxia: Secondary | ICD-10-CM | POA: Diagnosis not present

## 2021-07-30 DIAGNOSIS — F1910 Other psychoactive substance abuse, uncomplicated: Secondary | ICD-10-CM | POA: Diagnosis present

## 2021-07-30 LAB — GLUCOSE, CAPILLARY
Glucose-Capillary: 124 mg/dL — ABNORMAL HIGH (ref 70–99)
Glucose-Capillary: 93 mg/dL (ref 70–99)

## 2021-07-30 LAB — CBC WITH DIFFERENTIAL/PLATELET
Abs Immature Granulocytes: 1.42 10*3/uL — ABNORMAL HIGH (ref 0.00–0.07)
Basophils Absolute: 0.1 10*3/uL (ref 0.0–0.1)
Basophils Relative: 0 %
Eosinophils Absolute: 0 10*3/uL (ref 0.0–0.5)
Eosinophils Relative: 0 %
HCT: 23.6 % — ABNORMAL LOW (ref 36.0–46.0)
Hemoglobin: 8.1 g/dL — ABNORMAL LOW (ref 12.0–15.0)
Immature Granulocytes: 9 %
Lymphocytes Relative: 22 %
Lymphs Abs: 3.4 10*3/uL (ref 0.7–4.0)
MCH: 29.9 pg (ref 26.0–34.0)
MCHC: 34.3 g/dL (ref 30.0–36.0)
MCV: 87.1 fL (ref 80.0–100.0)
Monocytes Absolute: 1.7 10*3/uL — ABNORMAL HIGH (ref 0.1–1.0)
Monocytes Relative: 11 %
Neutro Abs: 9 10*3/uL — ABNORMAL HIGH (ref 1.7–7.7)
Neutrophils Relative %: 58 %
Platelets: 70 10*3/uL — ABNORMAL LOW (ref 150–400)
RBC: 2.71 MIL/uL — ABNORMAL LOW (ref 3.87–5.11)
RDW: 21.4 % — ABNORMAL HIGH (ref 11.5–15.5)
WBC: 15.6 10*3/uL — ABNORMAL HIGH (ref 4.0–10.5)
nRBC: 6.2 % — ABNORMAL HIGH (ref 0.0–0.2)

## 2021-07-30 LAB — COMPREHENSIVE METABOLIC PANEL
ALT: 68 U/L — ABNORMAL HIGH (ref 0–44)
AST: 101 U/L — ABNORMAL HIGH (ref 15–41)
Albumin: 2.2 g/dL — ABNORMAL LOW (ref 3.5–5.0)
Alkaline Phosphatase: 538 U/L — ABNORMAL HIGH (ref 38–126)
Anion gap: 7 (ref 5–15)
BUN: 35 mg/dL — ABNORMAL HIGH (ref 8–23)
CO2: 20 mmol/L — ABNORMAL LOW (ref 22–32)
Calcium: 8.4 mg/dL — ABNORMAL LOW (ref 8.9–10.3)
Chloride: 118 mmol/L — ABNORMAL HIGH (ref 98–111)
Creatinine, Ser: 1.06 mg/dL — ABNORMAL HIGH (ref 0.44–1.00)
GFR, Estimated: 59 mL/min — ABNORMAL LOW (ref >60)
Glucose, Bld: 124 mg/dL — ABNORMAL HIGH (ref 70–99)
Potassium: 4.9 mmol/L (ref 3.5–5.1)
Sodium: 145 mmol/L (ref 135–145)
Total Bilirubin: 10.7 mg/dL — ABNORMAL HIGH (ref 0.3–1.2)
Total Protein: 5 g/dL — ABNORMAL LOW (ref 6.5–8.1)

## 2021-07-30 LAB — AMMONIA: Ammonia: 92 umol/L — ABNORMAL HIGH (ref 9–35)

## 2021-07-30 LAB — PHOSPHORUS: Phosphorus: 5.3 mg/dL — ABNORMAL HIGH (ref 2.5–4.6)

## 2021-07-30 LAB — PROTIME-INR
INR: 1.3 — ABNORMAL HIGH (ref 0.8–1.2)
Prothrombin Time: 16.5 seconds — ABNORMAL HIGH (ref 11.4–15.2)

## 2021-07-30 LAB — MAGNESIUM: Magnesium: 3.2 mg/dL — ABNORMAL HIGH (ref 1.7–2.4)

## 2021-07-30 MED ORDER — LORAZEPAM 2 MG/ML IJ SOLN
0.5000 mg | INTRAMUSCULAR | Status: DC | PRN
  Administered 2021-08-01: 0.5 mg via INTRAVENOUS
  Filled 2021-07-30: qty 1

## 2021-07-30 NOTE — Progress Notes (Signed)
Daily Progress Note   Patient Name: Meredith Goodman       Date: 07/30/2021 DOB: 04-03-1956  Age: 66 y.o. MRN#: [id number] Attending Physician: Allie Bossier, MD Primary Care Physician: Pcp, No Admit Date: 07/24/2021  Reason for Consultation/Follow-up: Establishing goals of care  Subjective: Awake alert, in no distress, is able to verbalize, follow commands and make needs known. Son and daughter are at bedside.    Goals of care discussions held, see below.   Length of Stay: 6  Current Medications: Scheduled Meds:   atenolol  12.5 mg Per Tube Daily   Chlorhexidine Gluconate Cloth  6 each Topical Daily   feeding supplement (PROSource TF)  45 mL Per Tube Daily   folic acid  1 mg Per Tube Daily   free water  200 mL Per Tube Q4H   insulin aspart  0-9 Units Subcutaneous Q4H   lactulose  30 g Per Tube BID   mouth rinse  15 mL Mouth Rinse BID   multivitamin with minerals  1 tablet Per Tube Daily   pantoprazole sodium  40 mg Per Tube QHS   potassium chloride  40 mEq Per Tube BID   sodium chloride flush  10-40 mL Intracatheter Q12H   thiamine  100 mg Per Tube Daily   Or   thiamine  100 mg Intravenous Daily    Continuous Infusions:  sodium chloride Stopped (07/28/21 0342)   feeding supplement (OSMOLITE 1.5 CAL) 1,000 mL (07/28/21 2031)   piperacillin-tazobactam (ZOSYN)  IV Stopped (07/30/21 1042)    PRN Meds: sodium chloride, fentaNYL (SUBLIMAZE) injection, hydrOXYzine, lip balm, metoprolol tartrate, metoprolol tartrate, oxyCODONE, sodium chloride flush  Physical Exam     General: awake alert no distress Chronically ill-appearing Regular work of breathing No edema Abdomen not tender.     Vital Signs: BP (!) 159/94    Pulse 100    Temp 99 F (37.2 C) (Oral)     Resp (!) 31    Wt 52 kg    SpO2 96%    BMI 20.97 kg/m  SpO2: SpO2: 96 % O2 Device: O2 Device: Nasal Cannula O2 Flow Rate: O2 Flow Rate (L/min): 2 L/min  Intake/output summary:  Intake/Output Summary (Last 24 hours) at 07/30/2021 1232 Last data filed at 07/30/2021 1150 Gross per 24 hour  Intake 625.78 ml  Output 1062 ml  Net -436.22 ml    LBM: Last BM Date: 07/29/21 Baseline Weight: Weight: 48.3 kg Most recent weight: Weight: 52 kg       Palliative Assessment/Data:    Flowsheet Rows    Flowsheet Row Most Recent Value  Intake Tab   Referral Department Hospitalist  Unit at Time of Referral ICU  Palliative Care Primary Diagnosis Cancer  Date Notified 07/25/21  Palliative Care Type New Palliative care  Reason for referral Clarify Goals of Care  Date of Admission 07/24/21  Date first seen by Palliative Care 07/26/21  # of days Palliative referral response time 1 Day(s)  # of days IP prior to Palliative referral 1  Clinical Assessment   Palliative Performance Scale Score 10%  Psychosocial & Spiritual Assessment   Palliative Care Outcomes   Patient/Family meeting held? 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 female  with past medical history of hypertension, carcinoid tumor of the ileum, small cell lung cancer, recent discovery of liver mets who recently started on chemotherapy with Dr. Burr Medico admitted on 07/24/2021 with shortness of breath.  CTA ruled out PE but showed right upper lobe mass versus pneumonia.   Her mental status has declined and she was obtunded but then became very combative and currently on Precedex infusion.  She was evaluated by GI due to decompensated liver with worsening pancytopenia and concern for hepatic encephalopathy.  Dr. Burr Medico evaluated her and discussed poor prognosis with patient and her son.  Recommendation for DNR/DNI was made.  She does remain a full code at this time.  Noted to have found [place] substance in her room and UDS was positive for cocaine.  Precedex started 1/14 due to continued agitation encephalopathy.    Recommendations/Plan: Family meeting with the patient's daughter and son held: [place] to transfer out of the ICU Monitor hospital course Consider residential hospice versus SNF rehab with palliative on discharge, PMT to follow.      Code Status: now DNR 07-29-21.     Code Status Orders  (From admission, onward)           Start     Ordered   07/24/21 1728  Full code  Continuous        07/24/21 1727           Code Status History     This patient has a current code status but no historical code status.       Prognosis: Guarded  Discharge Planning: To Be Determined  Care plan was discussed with IDT  Thank you for allowing the Palliative Medicine Team to assist in the care of this patient.   Time In: 12 Time Out: 12.35 Total Time 35 Prolonged Time Billed No   Loistine Chance, MD  Please contact Palliative Medicine Team phone at [phone] for questions and concerns.

## 2021-07-30 NOTE — Progress Notes (Signed)
Chaplain responded to consult for support.  Lasandra and her daughter were grateful for the visit and requested prayer. Chaplain provided prayer and ministry of presence.  [place], Bcc Pager, [phone] 4:13 PM

## 2021-07-30 NOTE — Progress Notes (Signed)
Pt A&Ox4.  Requesting fluids to drink.  RN performed [employer] Screen.  Pt was able to drink 3 oz water but coughed after.  Pt did not pass swallow screen evaluation.  MD notified.   Justice Britain, RN

## 2021-07-30 NOTE — Progress Notes (Signed)
Nutrition Follow-up  DOCUMENTATION CODES:   Severe malnutrition in context of chronic illness  INTERVENTION:  - no plan for NGT replacement. - RN reports plan for soft foods for comfort feeding.    NUTRITION DIAGNOSIS:   Severe Malnutrition related to chronic illness, cancer and cancer related treatments as evidenced by severe fat depletion, severe muscle depletion. -ongoing  GOAL:   Patient will meet greater than or equal to 90% of their needs -unable to meet   MONITOR:   PO intake, Labs, Weight trends  ASSESSMENT:   66 y.o. female with medical history of malignant carcinoid of the ileum, small cell lung cancer, recent dx of liver metastasis, undergoing chemo, DM, hypercholesteremia, hemorrhoids, vitamin D deficiency, arthritis, GERD, pre-diabetes vs DM, and essential HTN. She presented to the ED due to sudden onset of shortness of breath x1 day. She presented to the ED via EMS from home.  Patient assessed in person by this RD on 1/13. NGT placed in R nare on 1/14 at 2020. Assessed remotely by a RD on 1/15 for TF initiation and management. Patient pulled NGT yesterday at 1700. NGT replaced in R nare yesterday at 1715 and pulled again at 1830.  Able to talk with RN who reports no plan for NGT replacement. Plan for soft foods to allow for comfort feeding. Family's goal is to get patient home, per RN.  Weight stable 1/12-1/17 and weight today is +7 lb compared to weight yesterday. Mild pitting edema to BLE documented in the edema section of flow sheet yesterday.   Patient seen by Palliative Care MD this AM and note indicates likely residential hospice vs home with hospice dependent on PO intake and mental status over the next 1-2 days.     Labs reviewed; CBGs: 124 and 93 mg/dl, Cl: 118 mmol/l, BUN: 24 mg/dl, creatinine: 1.06 mg/dl, Ca: 8.4 mg/dl, Phos: 5.3 mg/dl, Mg: 3.2 mg/dl, Alk Phos elevated, LFTs elevated (slightly down from 1/17), ammonia: 92 umol/l.  Medications reviewed;  30 mmol IV KPhos x1 run 1/17.   Diet Order:   Diet Order             Diet NPO time specified  Diet effective now                   EDUCATION NEEDS:   No education needs have been identified at this time  Skin:  Skin Assessment: Skin Integrity Issues: Skin Integrity Issues:: Other (Comment) Other: puncture wound to abdomen for liver biopsy (12/30)  Last BM:  1/17 (800 ml via rectal tube)  Height:   Ht Readings from Last 1 Encounters:  07/17/21 5' 2"  (1.575 m)    Weight:   Wt Readings from Last 1 Encounters:  07/30/21 52 kg     Estimated Nutritional Needs:  Kcal:  1700-1950 kcal Protein:  85-100 grams Fluid:  >/= 1.6 L/day     Jarome Matin, MS, RD, LDN Inpatient Clinical Dietitian RD pager # available in [place]  After hours/weekend pager # available in [facility]

## 2021-07-30 NOTE — Progress Notes (Signed)
Chaplain encountered Meredith Goodman's daughter in ICU waiting room and provided support and prayer at dtr's request.  [name], Bcc Pager, [phone] 4:14 PM

## 2021-07-30 NOTE — Progress Notes (Signed)
PMT no charge note  Chart reviewed, call placed and discussed with daughter [name]. We reviewed about the patient's current condition and next steps.  Plan: SLP evaluation, ok to proceed with comfort feeds, if patient is awake alert and asking for food/drink, since overall focus is on comfort measures.  Patient will likely need residential hospice versus home with hospice, based on her PO intake and mental status over the course of the next 1-2 days. PMT to follow.  Loistine Chance MD [place] palliative.  No charge.

## 2021-07-30 NOTE — Evaluation (Signed)
SLP Cancellation Note  Patient Details Name: Meredith Goodman MRN: [id number] DOB: 01-02-1956   Cancelled treatment:       Reason Eval/Treat Not Completed: Other (comment) (pt being transferred to 5th floor when SLP visit attempted, will follow up in am.)   Macario Golds 07/30/2021, 4:51 PM  Kathleen Lime, [place] Office [phone] Cell [phone]

## 2021-07-30 NOTE — Progress Notes (Signed)
Patient now comfort care.  Will monitor routine vitals.

## 2021-07-30 NOTE — Progress Notes (Signed)
°   07/30/21 1448  Assess: MEWS Score  Temp 100.2 F (37.9 C)  BP (!) 171/93  Pulse Rate (!) 127  Resp (!) 22  SpO2 99 %  O2 Device Nasal Cannula  Assess: MEWS Score  MEWS Temp 0  MEWS Systolic 0  MEWS Pulse 2  MEWS RR 1  MEWS LOC 0  MEWS Score 3  MEWS Score Color Yellow  Assess: if the MEWS score is Yellow or Red  Were vital signs taken at a resting state? Yes  Focused Assessment No change from prior assessment (pt transfered from ICU and has been tachycardic)  Does the patient meet 2 or more of the SIRS criteria? No  MEWS guidelines implemented *See Row Information* No, vital signs rechecked  Treat  MEWS Interventions Escalated (See documentation below)  Take Vital Signs  Increase Vital Sign Frequency  Yellow: Q 2hr X 2 then Q 4hr X 2, if remains yellow, continue Q 4hrs  Escalate  MEWS: Escalate Yellow: discuss with charge nurse/RN and consider discussing with provider and RRT  Notify: Charge Nurse/RN  Name of Charge Nurse/RN Notified Mariel Craft. RN/ Chasity H. RN  Date Charge Nurse/RN Notified 07/30/21  Time Charge Nurse/RN Notified 1455  Document  Patient Outcome Other (Comment) (Transferred to 5W. Patient will transition to comfort care measures. Patient has been tachycardic in the ICU. No change from previous vitals in ICU)  Progress note created (see row info) Yes  Assess: SIRS CRITERIA  SIRS Temperature  0  SIRS Pulse 1  SIRS Respirations  1  SIRS WBC 0  SIRS Score Sum  2

## 2021-07-30 NOTE — Progress Notes (Signed)
PROGRESS NOTE    Meredith Goodman  WYO:[id number] DOB: 05-01-1956 DOA: 07/24/2021 PCP: Pcp, No     Brief Narrative:  66 y.o. 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     Work-up revealed hepatic encephalopathy, acute hypoxic respiratory failure secondary to right upper lobe pneumonia versus right upper lobe lung mass, decompensated liver failure, coagulopathy.  Seen by medical oncology and GI.  Patient has a very poor prognosis, therefore palliative care was consulted.     UDS positive for opiates and cocaine on 07/24/2021.   Hospital course complicated by agitation delirium for which she was started on Precedex drip and transferred to ICU.  PICC line and NG tube placed on 07/27/2021.   07/29/2021: Seen and examined at her bedside.  She is sedated on Precedex however she arouses to voices.  Denies having any pain.  Her abdomen is distended.     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 -1/18 agitation resolved -1/18 patient now comfort care   Refractory hypokalemia, likely from GI losses -1/18 patient now comfort care   Hypophosphatemia -1/18 patient now comfort care   Mild non-anion gap metabolic acidosis -7/74  patient now comfort care   Hypernatremia, acute -1/18 patient now comfort care   Improving leukopenia/neutropenia -1/18 patient now comfort care   Worsening elevated liver chemistries in the setting of liver metastasis. -1/18 patient now comfort care   Hyperbilirubinemia -1/18 patient now comfort care   AKI likely secondary to intravascular volume depletion -1/18 patient now comfort care   Severe protein calorie malnutrition -Awaiting speech evaluation Will start comfort feeds. -1/18 patient now comfort care   Acute blood loss anemia, rule out intra-abdominal bleed -1/18 patient now comfort care   Sepsis, suspect intra-abdominal source -1/18 patient now comfort care   Small abdominal ascites in the setting of recently diagnosed mets to the liver -1/18 patient now comfort care   Treated coagulopathy in the setting of advanced liver disease -1/18 patient now comfort care   Uncontrolled hypertension -1/18 patient now comfort care.   Chronic thrombocytopenia in the setting of advanced liver disease -1/13 transfuse 1 unit of platelet  -1/18 patient now comfort care   Improving pancytopenia likely secondary to recent chemotherapy -1/18 patient now comfort care   Chronic pain syndrome -1/18 patient now comfort care   Polysubstance abuse including cocaine and alcohol. -1/18 patient now comfort care   Goals of care Palliative care team consulted to assist with establishing goals of care Very poor prognosis in the setting of advanced cancer, decompensated advanced liver disease, liver metastasis, ongoing intermittent alcohol use, drug use. Made DNR on 07/29/2021     DVT prophylaxis:  Code Status: Comfort care Family Communication: 1/18 daughters at bedside and a care all  questions answered Status is: Inpatient    Dispo: The patient is from: Home              Anticipated d/c is to:  Home with hospice?  We will await recommendations from palliative care               Anticipated d/c date is: 2 days              Patient currently is not medically stable to d/c.      Consultants:  Palliative care PCCM  Procedures/Significant Events:    I have personally reviewed and interpreted all radiology studies and my findings are as above.  VENTILATOR SETTINGS:    Cultures   Antimicrobials:    Devices    LINES / TUBES:    Continuous Infusions:  sodium chloride Stopped (07/28/21 0342)     Objective: Vitals:   07/30/21 1100 07/30/21 1200 07/30/21 1300 07/30/21 1448  BP: (!) 170/84 (!) 170/92 (!) 180/90 (!) 171/93  Pulse: (!) 106 (!) 110 (!) 118 (!) 127  Resp: (!) 28 (!) 23 (!) 27 (!) 22  Temp:    100.2 F (37.9 C)  TempSrc:    Oral  SpO2: 96% 95% 97% 99%  Weight:        Intake/Output Summary (Last 24 hours) at 07/30/2021 1712 Last data filed at 07/30/2021 1150 Gross per 24 hour  Intake 388.57 ml  Output 250 ml  Net 138.57 ml   Filed Weights   07/28/21 0500 07/29/21 0500 07/30/21 0500  Weight: 49.6 kg 49 kg 52 kg    Examination:  General: A/O x3, No acute respiratory distress, cachectic Eyes: negative scleral hemorrhage, negative anisocoria, negative icterus ENT: Negative Runny nose, negative gingival bleeding, Neck:  Negative scars, masses, torticollis, lymphadenopathy, JVD Lungs: decreased breath sounds bilaterally without wheezes or crackles Cardiovascular: Regular rate and rhythm without murmur gallop or rub normal S1 and S2 Abdomen: negative abdominal pain, nondistended, positive soft, bowel sounds, no rebound, no ascites, no appreciable mass Extremities: No significant cyanosis, clubbing, or edema bilateral lower extremities Skin: Negative rashes, lesions, ulcers Psychiatric:  Negative depression, negative anxiety, negative fatigue, negative mania  Central nervous system:  Cranial nerves II through XII intact, tongue/uvula midline, all extremities muscle strength 5/5, sensation intact throughout, negative  dysarthria, negative expressive aphasia, negative receptive aphasia.  .     Data Reviewed: Care during the described time interval was provided by me .  I have reviewed this patient's available data, including medical history, events of note, physical examination, and all test results as part of my evaluation.  CBC: Recent Labs  Lab 07/26/21 0253 07/27/21 0251 07/28/21 0008 07/29/21 0609 07/30/21 0428  WBC 1.5* 1.3* 3.4* 5.7 15.6*  NEUTROABS 0.3* 0.2* 1.1* 2.7 9.0*  HGB 10.6* 8.1* 8.3* 8.1* 8.1*  HCT 28.9* 22.3* 23.2* 23.6* 23.6*  MCV 83.3 84.2 85.3 87.7 87.1  PLT 50* 38* 53* 61* 70*   Basic Metabolic Panel: Recent Labs  Lab 07/26/21 0253 07/27/21 0251 07/28/21 0327 07/29/21 0609 07/30/21 0428  NA 141 144 147* 146* 145  K 3.7 2.6* 4.2 3.2* 4.9  CL 113* 112* 119* 118* 118*  CO2 19* 23 20* 21* 20*  GLUCOSE 136* 157* 158* 171* 124*  BUN 33* 29* 38* 36* 35*  CREATININE 0.84 0.90 1.23* 1.05* 1.06*  CALCIUM 8.3* 8.0* 8.1* 8.2* 8.4*  MG 1.7 2.1 2.2 2.1 3.2*  PHOS 3.2 3.4 2.5 2.0* 5.3*   GFR: Estimated Creatinine Clearance: 42.4  mL/min (A) (by C-G formula based on SCr of 1.06 mg/dL (H)). Liver Function Tests: Recent Labs  Lab 07/26/21 0253 07/27/21 0251 07/28/21 0327 07/29/21 0609 07/30/21 0428  AST 89* 64* 89* 123* 101*  ALT 59* 50* 56* 69* 68*  ALKPHOS 421* 359* 465* 542* 538*  BILITOT 14.2* 11.6* 11.0* 10.5* 10.7*  PROT 5.5* 4.8* 4.8* 5.0* 5.0*  ALBUMIN 2.5* 2.1* 2.1* 2.1* 2.2*   No results for input(s): LIPASE, AMYLASE in the last 168 hours. Recent Labs  Lab 07/24/21 1412 07/25/21 0713 07/28/21 0327 07/30/21 0427  AMMONIA 57* 94* 97* 92*   Coagulation Profile: Recent Labs  Lab 07/26/21 0345 07/27/21 0251 07/28/21 0327 07/29/21 0609 07/30/21 0644  INR 1.4* 1.4* 1.5* 1.4* 1.3*   Cardiac Enzymes: Recent Labs  Lab 07/27/21 0251  CKTOTAL 80  CKMB 2.1   BNP (last 3 results) No results for input(s): PROBNP in the last 8760  hours. HbA1C: Recent Labs    07/28/21 0327  HGBA1C 6.6*   CBG: Recent Labs  Lab 07/29/21 1635 07/29/21 2007 07/29/21 2328 07/30/21 0335 07/30/21 0832  GLUCAP 133* 109* 103* 124* 93   Lipid Profile: No results for input(s): CHOL, HDL, LDLCALC, TRIG, CHOLHDL, LDLDIRECT in the last 72 hours. Thyroid Function Tests: No results for input(s): TSH, T4TOTAL, FREET4, T3FREE, THYROIDAB in the last 72 hours. Anemia Panel: No results for input(s): VITAMINB12, FOLATE, FERRITIN, TIBC, IRON, RETICCTPCT in the last 72 hours. Sepsis Labs: Recent Labs  Lab 07/24/21 1623 07/25/21 0253 07/27/21 0251  PROCALCITON  --  2.55  --   LATICACIDVEN 1.5  --  1.5    Recent Results (from the past 240 hour(s))  Urine Culture     Status: Abnormal   Collection Time: 07/24/21  1:49 PM   Specimen: In/Out Cath Urine  Result Value Ref Range Status   Specimen Description   Final    IN/OUT CATH URINE Performed at [facility], [place] [address], [place], [place] [address]    Special Requests   Final    Immunocompromised Performed at [facility], [place] [address], [place], [place] [address]    Culture MULTIPLE SPECIES PRESENT, SUGGEST RECOLLECTION (A)  Final   Report Status 07/26/2021 FINAL  Final  Blood Culture (routine x 2)     Status: None   Collection Time: 07/24/21  2:20 PM   Specimen: BLOOD  Result Value Ref Range Status   Specimen Description   Final    BLOOD BLOOD LEFT FOREARM Performed at [place] [address] [place], [place] [address]    Special Requests   Final    BOTTLES DRAWN AEROBIC AND ANAEROBIC Blood Culture results may not be optimal due to an inadequate volume of blood received in culture bottles Performed at [place] [address], [place], [place] [address]    Culture   Final    NO GROWTH 5 DAYS Performed at [facility], [place] [address], [place], [place] [address]    Report Status  07/29/2021 FINAL  Final  Blood Culture (routine x 2)     Status: None   Collection Time: 07/24/21  2:25 PM   Specimen: BLOOD  Result Value Ref Range Status   Specimen Description   Final    BLOOD BLOOD RIGHT FOREARM Performed at [facility] [address], [place], [place] [address]    Special Requests   Final    BOTTLES DRAWN AEROBIC AND ANAEROBIC  Blood Culture results may not be optimal due to an inadequate volume of blood received in culture bottles Performed at [place] [address] [place], [place] [address]    Culture   Final    NO GROWTH 5 DAYS Performed at [facility], [place] [address] [place], [place] [address]    Report Status 07/29/2021 FINAL  Final  Resp Panel by RT-PCR (Flu A&B, Covid) Nasopharyngeal Swab     Status: None   Collection Time: 07/24/21  2:36 PM   Specimen: Nasopharyngeal Swab; Nasopharyngeal(NP) swabs in vial transport medium  Result Value Ref Range Status   SARS Coronavirus 2 by RT PCR NEGATIVE NEGATIVE Final    Comment: (NOTE) SARS-CoV-2 target nucleic acids are NOT DETECTED.  The SARS-CoV-2 RNA is generally detectable in upper respiratory specimens during the acute phase of infection. The lowest concentration of SARS-CoV-2 viral copies this assay can detect is 138 copies/mL. A negative result does not preclude SARS-Cov-2 infection and should not be used as the sole basis for treatment or other patient management decisions. A negative result may occur with  improper specimen collection/handling, submission of specimen other than nasopharyngeal swab, presence of viral mutation(s) within the areas targeted by this assay, and inadequate number of viral copies(<138 copies/mL). A negative result must be combined with clinical observations, patient history, and epidemiological information. The expected result is Negative.  Fact Sheet for Patients:  EntrepreneurPulse.com.au  Fact  Sheet for Healthcare Providers:  IncredibleEmployment.be  This test is no t yet approved or cleared by the [place] FDA and  has been authorized for detection and/or diagnosis of SARS-CoV-2 by FDA under an Emergency Use Authorization (EUA). This EUA will remain  in effect (meaning this test can be used) for the duration of the COVID-19 declaration under Section 564(b)(1) of the Act, 21 U.S.C.section 360bbb-3(b)(1), unless the authorization is terminated  or revoked sooner.       Influenza A by PCR NEGATIVE NEGATIVE Final   Influenza B by PCR NEGATIVE NEGATIVE Final    Comment: (NOTE) The Xpert Xpress SARS-CoV-2/FLU/RSV plus assay is intended as an aid in the diagnosis of influenza from Nasopharyngeal swab specimens and should not be used as a sole basis for treatment. Nasal washings and aspirates are unacceptable for Xpert Xpress SARS-CoV-2/FLU/RSV testing.  Fact Sheet for Patients: EntrepreneurPulse.com.au  Fact Sheet for Healthcare Providers: IncredibleEmployment.be  This test is not yet approved or cleared by the [place] FDA and has been authorized for detection and/or diagnosis of SARS-CoV-2 by FDA under an Emergency Use Authorization (EUA). This EUA will remain in effect (meaning this test can be used) for the duration of the COVID-19 declaration under Section 564(b)(1) of the Act, 21 U.S.C. section 360bbb-3(b)(1), unless the authorization is terminated or revoked.  Performed at [facility], [place] [address], [place], [place] [address]   MRSA Next Gen by PCR, Nasal     Status: None   Collection Time: 07/24/21  6:56 PM   Specimen: Nasal Mucosa; Nasal Swab  Result Value Ref Range Status   MRSA by PCR Next Gen NOT DETECTED NOT DETECTED Final    Comment: (NOTE) The GeneXpert MRSA Assay (FDA approved for NASAL specimens only), is one component of a comprehensive MRSA colonization  surveillance program. It is not intended to diagnose MRSA infection nor to guide or monitor treatment for MRSA infections. Test performance is not FDA approved in patients less than 62 years old. Performed at [facility], [place] [address] [place], [place] [address]          Radiology Studies: DG Abd 1 View  Result Date: 07/29/2021 CLINICAL DATA:  NG tube placement. EXAM: ABDOMEN - 1 VIEW COMPARISON:  07/26/2021 FINDINGS: The tip of an endotracheal tube projects near the GE junction with side hole overlying the distal esophagus. The tube is more proximally located than on the prior study. A right PICC terminates near the superior cavoatrial junction/high right atrium. There is mild gaseous distension of the splenic flexure of the colon. There is persistent right suprahilar consolidation, more fully evaluated on a recent chest CTA. IMPRESSION: Endotracheal tube terminating near the GE junction. Advancement by 10 cm would place both the tip and side hole well within the stomach. Electronically Signed   By: Logan Bores M.D.   On: 07/29/2021 17:42        Scheduled Meds:   Continuous Infusions:  sodium chloride Stopped (07/28/21 0342)     LOS: 6 days    Time spent:40 min   Denette Hass, Geraldo Docker, MD [facility]   If 7PM-7AM, please contact night-coverage 07/30/2021, 5:12 PM

## 2021-07-31 ENCOUNTER — Inpatient Hospital Stay (HOSPITAL_COMMUNITY): Payer: Commercial Managed Care - HMO

## 2021-07-31 DIAGNOSIS — F1410 Cocaine abuse, uncomplicated: Secondary | ICD-10-CM | POA: Diagnosis not present

## 2021-07-31 DIAGNOSIS — R0602 Shortness of breath: Secondary | ICD-10-CM | POA: Diagnosis not present

## 2021-07-31 DIAGNOSIS — J9621 Acute and chronic respiratory failure with hypoxia: Secondary | ICD-10-CM | POA: Diagnosis not present

## 2021-07-31 DIAGNOSIS — R7401 Elevation of levels of liver transaminase levels: Secondary | ICD-10-CM

## 2021-07-31 DIAGNOSIS — E43 Unspecified severe protein-calorie malnutrition: Secondary | ICD-10-CM | POA: Diagnosis not present

## 2021-07-31 DIAGNOSIS — C3490 Malignant neoplasm of unspecified part of unspecified bronchus or lung: Secondary | ICD-10-CM | POA: Diagnosis not present

## 2021-07-31 DIAGNOSIS — R180 Malignant ascites: Secondary | ICD-10-CM

## 2021-07-31 DIAGNOSIS — C801 Malignant (primary) neoplasm, unspecified: Secondary | ICD-10-CM

## 2021-07-31 LAB — COMPREHENSIVE METABOLIC PANEL
ALT: 61 U/L — ABNORMAL HIGH (ref 0–44)
AST: 92 U/L — ABNORMAL HIGH (ref 15–41)
Albumin: 2.2 g/dL — ABNORMAL LOW (ref 3.5–5.0)
Alkaline Phosphatase: 595 U/L — ABNORMAL HIGH (ref 38–126)
Anion gap: 9 (ref 5–15)
BUN: 28 mg/dL — ABNORMAL HIGH (ref 8–23)
CO2: 20 mmol/L — ABNORMAL LOW (ref 22–32)
Calcium: 8.4 mg/dL — ABNORMAL LOW (ref 8.9–10.3)
Chloride: 114 mmol/L — ABNORMAL HIGH (ref 98–111)
Creatinine, Ser: 1.05 mg/dL — ABNORMAL HIGH (ref 0.44–1.00)
GFR, Estimated: 59 mL/min — ABNORMAL LOW (ref >60)
Glucose, Bld: 130 mg/dL — ABNORMAL HIGH (ref 70–99)
Potassium: 4.2 mmol/L (ref 3.5–5.1)
Sodium: 143 mmol/L (ref 135–145)
Total Bilirubin: 13.4 mg/dL — ABNORMAL HIGH (ref 0.3–1.2)
Total Protein: 5.3 g/dL — ABNORMAL LOW (ref 6.5–8.1)

## 2021-07-31 LAB — CBC WITH DIFFERENTIAL/PLATELET
Abs Immature Granulocytes: 4.36 10*3/uL — ABNORMAL HIGH (ref 0.00–0.07)
Basophils Absolute: 0.1 10*3/uL (ref 0.0–0.1)
Basophils Relative: 0 %
Eosinophils Absolute: 0.1 10*3/uL (ref 0.0–0.5)
Eosinophils Relative: 0 %
HCT: 26.4 % — ABNORMAL LOW (ref 36.0–46.0)
Hemoglobin: 9.2 g/dL — ABNORMAL LOW (ref 12.0–15.0)
Immature Granulocytes: 13 %
Lymphocytes Relative: 16 %
Lymphs Abs: 5.2 10*3/uL — ABNORMAL HIGH (ref 0.7–4.0)
MCH: 29.6 pg (ref 26.0–34.0)
MCHC: 34.8 g/dL (ref 30.0–36.0)
MCV: 84.9 fL (ref 80.0–100.0)
Monocytes Absolute: 3.6 10*3/uL — ABNORMAL HIGH (ref 0.1–1.0)
Monocytes Relative: 11 %
Neutro Abs: 19.8 10*3/uL — ABNORMAL HIGH (ref 1.7–7.7)
Neutrophils Relative %: 60 %
Platelets: 68 10*3/uL — ABNORMAL LOW (ref 150–400)
RBC: 3.11 MIL/uL — ABNORMAL LOW (ref 3.87–5.11)
RDW: 23.5 % — ABNORMAL HIGH (ref 11.5–15.5)
WBC: 31.9 10*3/uL — ABNORMAL HIGH (ref 4.0–10.5)
nRBC: 1.8 % — ABNORMAL HIGH (ref 0.0–0.2)

## 2021-07-31 LAB — RESPIRATORY PANEL BY PCR

## 2021-07-31 LAB — MAGNESIUM: Magnesium: 1.8 mg/dL (ref 1.7–2.4)

## 2021-07-31 LAB — LACTIC ACID, PLASMA
Lactic Acid, Venous: 1.5 mmol/L (ref 0.5–1.9)
Lactic Acid, Venous: 1.7 mmol/L (ref 0.5–1.9)

## 2021-07-31 LAB — GLUCOSE, CAPILLARY
Glucose-Capillary: 71 mg/dL (ref 70–99)
Glucose-Capillary: 87 mg/dL (ref 70–99)
Glucose-Capillary: 119 mg/dL — ABNORMAL HIGH (ref 70–99)
Glucose-Capillary: 112 mg/dL — ABNORMAL HIGH (ref 70–99)

## 2021-07-31 LAB — PROCALCITONIN: Procalcitonin: 3.61 ng/mL

## 2021-07-31 LAB — PHOSPHORUS: Phosphorus: 2.9 mg/dL (ref 2.5–4.6)

## 2021-07-31 MED ORDER — METOPROLOL TARTRATE 5 MG/5ML IV SOLN
5.0000 mg | Freq: Four times a day (QID) | INTRAVENOUS | Status: DC
  Administered 2021-07-31 – 2021-08-01 (×4): 5 mg via INTRAVENOUS
  Filled 2021-07-31 (×5): qty 5

## 2021-07-31 MED ORDER — ATENOLOL 25 MG PO TABS
12.5000 mg | ORAL_TABLET | Freq: Every day | ORAL | Status: DC
  Administered 2021-07-31: 12.5 mg via ORAL
  Filled 2021-07-31 (×2): qty 1

## 2021-07-31 MED ORDER — IPRATROPIUM-ALBUTEROL 0.5-2.5 (3) MG/3ML IN SOLN
3.0000 mL | Freq: Four times a day (QID) | RESPIRATORY_TRACT | Status: DC
  Administered 2021-08-01 (×2): 3 mL via RESPIRATORY_TRACT
  Filled 2021-07-31 (×2): qty 3

## 2021-07-31 MED ORDER — PROCHLORPERAZINE MALEATE 10 MG PO TABS
10.0000 mg | ORAL_TABLET | Freq: Four times a day (QID) | ORAL | Status: DC | PRN
  Filled 2021-07-31: qty 1

## 2021-07-31 MED ORDER — AMLODIPINE BESYLATE 5 MG PO TABS
5.0000 mg | ORAL_TABLET | Freq: Every day | ORAL | Status: DC
  Administered 2021-07-31 – 2021-08-01 (×2): 5 mg via ORAL
  Filled 2021-07-31 (×3): qty 1

## 2021-07-31 MED ORDER — FENTANYL CITRATE PF 50 MCG/ML IJ SOSY
25.0000 ug | PREFILLED_SYRINGE | INTRAMUSCULAR | Status: DC | PRN
  Administered 2021-07-31: 50 ug via INTRAVENOUS
  Filled 2021-07-31: qty 2

## 2021-07-31 NOTE — Progress Notes (Signed)
Meredith Goodman   DOB:[birth year]   XI#:[id number]   JQB#:[id number]  Oncology follow-up note  Subjective: Patient was transferred out ICU, awake, alert, answer some questions.  She complains of itchiness, she was spreading hand sanitizer on her legs for itchiness, still slightly confused.  Daughter at the bedside.    Objective:  Vitals:   07/31/21 1204 07/31/21 1710  BP: (!) 142/86 (!) 157/89  Pulse: (!) 104 (!) 106  Resp: 20 16  Temp:  97.9 F (36.6 C)  SpO2: 92% 94%    Body mass index is 20.97 kg/m.  Intake/Output Summary (Last 24 hours) at 07/31/2021 2026 Last data filed at 07/31/2021 1828 Gross per 24 hour  Intake 0 ml  Output 975 ml  Net -975 ml     Sclerae icteric  no peripheral edema  Neuro nonfocal   CBG (last 3)  Recent Labs    07/31/21 1136 07/31/21 1643 07/31/21 1959  GLUCAP 87 119* 112*     Labs:  Urine Studies No results for input(s): UHGB, CRYS in the last 72 hours.  Invalid input(s): UACOL, UAPR, USPG, UPH, UTP, UGL, UKET, UBIL, UNIT, UROB, ULEU, UEPI, UWBC, [place], [place], [place], [place], [place]  Basic Metabolic Panel: Recent Labs  Lab 07/27/21 0251 07/28/21 0327 07/29/21 0609 07/30/21 0428 07/31/21 1735  NA 144 147* 146* 145 143  K 2.6* 4.2 3.2* 4.9 4.2  CL 112* 119* 118* 118* 114*  CO2 23 20* 21* 20* 20*  GLUCOSE 157* 158* 171* 124* 130*  BUN 29* 38* 36* 35* 28*  CREATININE 0.90 1.23* 1.05* 1.06* 1.05*  CALCIUM 8.0* 8.1* 8.2* 8.4* 8.4*  MG 2.1 2.2 2.1 3.2* 1.8  PHOS 3.4 2.5 2.0* 5.3* 2.9   GFR Estimated Creatinine Clearance: 42.8 mL/min (A) (by C-G formula based on SCr of 1.05 mg/dL (H)). Liver Function Tests: Recent Labs  Lab 07/27/21 0251 07/28/21 0327 07/29/21 0609 07/30/21 0428 07/31/21 1735  AST 64* 89* 123* 101* 92*  ALT 50* 56* 69* 68* 61*  ALKPHOS 359* 465* 542* 538* 595*  BILITOT 11.6* 11.0* 10.5* 10.7* 13.4*  PROT 4.8* 4.8* 5.0* 5.0* 5.3*  ALBUMIN 2.1* 2.1* 2.1* 2.2* 2.2*   No results for input(s): LIPASE, AMYLASE in the  last 168 hours. Recent Labs  Lab 07/25/21 0713 07/28/21 0327 07/30/21 0427  AMMONIA 94* 97* 92*   Coagulation profile Recent Labs  Lab 07/26/21 0345 07/27/21 0251 07/28/21 0327 07/29/21 0609 07/30/21 0644  INR 1.4* 1.4* 1.5* 1.4* 1.3*    CBC: Recent Labs  Lab 07/27/21 0251 07/28/21 0008 07/29/21 0609 07/30/21 0428 07/31/21 1735  WBC 1.3* 3.4* 5.7 15.6* 31.9*  NEUTROABS 0.2* 1.1* 2.7 9.0* 19.8*  HGB 8.1* 8.3* 8.1* 8.1* 9.2*  HCT 22.3* 23.2* 23.6* 23.6* 26.4*  MCV 84.2 85.3 87.7 87.1 84.9  PLT 38* 53* 61* 70* 68*   Cardiac Enzymes: Recent Labs  Lab 07/27/21 0251  CKTOTAL 80  CKMB 2.1   BNP: Invalid input(s): POCBNP CBG: Recent Labs  Lab 07/30/21 0832 07/31/21 0722 07/31/21 1136 07/31/21 1643 07/31/21 1959  GLUCAP 93 71 87 119* 112*   D-Dimer No results for input(s): DDIMER in the last 72 hours. Hgb A1c No results for input(s): HGBA1C in the last 72 hours. Lipid Profile No results for input(s): CHOL, HDL, LDLCALC, TRIG, CHOLHDL, LDLDIRECT in the last 72 hours. Thyroid function studies No results for input(s): TSH, T4TOTAL, T3FREE, THYROIDAB in the last 72 hours.  Invalid input(s): FREET3 Anemia work up No results for input(s): VITAMINB12, FOLATE, FERRITIN,  TIBC, IRON, RETICCTPCT in the last 72 hours. Microbiology Recent Results (from the past 240 hour(s))  Urine Culture     Status: Abnormal   Collection Time: 07/24/21  1:49 PM   Specimen: In/Out Cath Urine  Result Value Ref Range Status   Specimen Description   Final    IN/OUT CATH URINE Performed at [facility], [place] [address], [place], [place] [address]    Special Requests   Final    Immunocompromised Performed at [facility], [place] [address], [place], [place] [address]    Culture MULTIPLE SPECIES PRESENT, SUGGEST RECOLLECTION (A)  Final   Report Status 07/26/2021 FINAL  Final  Blood Culture (routine x 2)     Status: None   Collection Time:  07/24/21  2:20 PM   Specimen: BLOOD  Result Value Ref Range Status   Specimen Description   Final    BLOOD BLOOD LEFT FOREARM Performed at [place] [address], [place], [place] [address]    Special Requests   Final    BOTTLES DRAWN AEROBIC AND ANAEROBIC Blood Culture results may not be optimal due to an inadequate volume of blood received in culture bottles Performed at [place] [address] [place], [place] [address]    Culture   Final    NO GROWTH 5 DAYS Performed at [facility], [place] [address], [place], [place] [address]    Report Status 07/29/2021 FINAL  Final  Blood Culture (routine x 2)     Status: None   Collection Time: 07/24/21  2:25 PM   Specimen: BLOOD  Result Value Ref Range Status   Specimen Description   Final    BLOOD BLOOD RIGHT FOREARM Performed at [place] [address] [place], [place] [address]    Special Requests   Final    BOTTLES DRAWN AEROBIC AND ANAEROBIC Blood Culture results may not be optimal due to an inadequate volume of blood received in culture bottles Performed at [place] [address], [place], [place] [address]    Culture   Final    NO GROWTH 5 DAYS Performed at [facility], [place] [address], [place], [place] [address]    Report Status 07/29/2021 FINAL  Final  Resp Panel by RT-PCR (Flu A&B, Covid) Nasopharyngeal Swab     Status: None   Collection Time: 07/24/21  2:36 PM   Specimen: Nasopharyngeal Swab; Nasopharyngeal(NP) swabs in vial transport medium  Result Value Ref Range Status   SARS Coronavirus 2 by RT PCR NEGATIVE NEGATIVE Final    Comment: (NOTE) SARS-CoV-2 target nucleic acids are NOT DETECTED.  The SARS-CoV-2 RNA is generally detectable in upper respiratory specimens during the acute phase of infection. The lowest concentration of SARS-CoV-2 viral copies this assay can detect is 138 copies/mL. A negative result  does not preclude SARS-Cov-2 infection and should not be used as the sole basis for treatment or other patient management decisions. A negative result may occur with  improper specimen collection/handling, submission of specimen other than nasopharyngeal swab, presence of viral mutation(s) within the areas targeted by this assay, and inadequate number of viral copies(<138 copies/mL). A negative result must be combined with clinical observations, patient history, and epidemiological information. The expected result is Negative.  Fact Sheet for Patients:  EntrepreneurPulse.com.au  Fact Sheet for Healthcare Providers:  IncredibleEmployment.be  This test is no t yet approved or cleared by the [place]  FDA and  has been authorized for detection and/or diagnosis of SARS-CoV-2 by FDA under an Emergency Use Authorization (EUA). This EUA will remain  in effect (meaning this test can be used) for the duration of the COVID-19 declaration under Section 564(b)(1) of the Act, 21 U.S.C.section 360bbb-3(b)(1), unless the authorization is terminated  or revoked sooner.       Influenza A by PCR NEGATIVE NEGATIVE Final   Influenza B by PCR NEGATIVE NEGATIVE Final    Comment: (NOTE) The Xpert Xpress SARS-CoV-2/FLU/RSV plus assay is intended as an aid in the diagnosis of influenza from Nasopharyngeal swab specimens and should not be used as a sole basis for treatment. Nasal washings and aspirates are unacceptable for Xpert Xpress SARS-CoV-2/FLU/RSV testing.  Fact Sheet for Patients: EntrepreneurPulse.com.au  Fact Sheet for Healthcare Providers: IncredibleEmployment.be  This test is not yet approved or cleared by the [place] FDA and has been authorized for detection and/or diagnosis of SARS-CoV-2 by FDA under an Emergency Use Authorization (EUA). This EUA will remain in effect (meaning this test can be used) for  the duration of the COVID-19 declaration under Section 564(b)(1) of the Act, 21 U.S.C. section 360bbb-3(b)(1), unless the authorization is terminated or revoked.  Performed at [facility], [place] [address] [place], [place] [address]   MRSA Next Gen by PCR, Nasal     Status: None   Collection Time: 07/24/21  6:56 PM   Specimen: Nasal Mucosa; Nasal Swab  Result Value Ref Range Status   MRSA by PCR Next Gen NOT DETECTED NOT DETECTED Final    Comment: (NOTE) The GeneXpert MRSA Assay (FDA approved for NASAL specimens only), is one component of a comprehensive MRSA colonization surveillance program. It is not intended to diagnose MRSA infection nor to guide or monitor treatment for MRSA infections. Test performance is not FDA approved in patients less than 80 years old. Performed at [facility], [place] [address], [place], [place] [address]       Studies:  DG CHEST PORT 1 VIEW  Result Date: 07/31/2021 CLINICAL DATA:  Shortness of breath. EXAM: PORTABLE CHEST 1 VIEW COMPARISON:  Chest x-ray 07/24/2021 and chest CT 07/24/2021. FINDINGS: New right upper extremity PICC terminates in the distal SVC. No pneumothorax. Stable right upper lobe/perihilar focal density. Stable small right pleural effusion. Cardiac silhouette within normal limits. No acute fractures. IMPRESSION: 1. New right upper extremity PICC line terminates in the distal SVC. 2. No pneumothorax. 3. Stable small right pleural effusion and right upper lobe/perihilar density. Electronically Signed   By: Ronney Asters M.D.   On: 07/31/2021 17:52    Assessment: 66 y.o. female   1.  Metastatic small cell cancer 2.  Pancytopenia, improved  3.  Sepsis with intra-abdominal source suspected 4.  Decompensated cirrhosis with ascites 5.  Metabolic encephalopathy 6.  Coagulopathy 7.  Acute respiratory failure with hypoxia 8.  AKI  Plan:  -pt was make comfort care, but her daughter wants all medical  treatment for now, DNR. She would like to take her home with hospice. I support her decision. Pt's daughter states she and her other family members will take care of her at home  -palliative care on board, appreciate their assistance,to see if her symptoms can be managed by oral meds to get her ready to go home with hospice  -I will be happy to remains as her attending when she is under hospice care   Truitt Merle  07/31/2021    Truitt Merle,  MD 07/31/2021  8:26 PM

## 2021-07-31 NOTE — Progress Notes (Addendum)
PROGRESS NOTE    Meredith Goodman  CVE:[id number] DOB: 05/23/1956 DOA: 07/24/2021 PCP: Pcp, No     Brief Narrative:  66 y.o. 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     Work-up revealed hepatic encephalopathy, acute hypoxic respiratory failure secondary to right upper lobe pneumonia versus right upper lobe lung mass, decompensated liver failure, coagulopathy.  Seen by medical oncology and GI.  Patient has a very poor prognosis, therefore palliative care was consulted.     UDS positive for opiates and cocaine on 07/24/2021.   Hospital course complicated by agitation delirium for which she was started on Precedex drip and transferred to ICU.  PICC line and NG tube placed on 07/27/2021.   07/29/2021: Seen and examined at her bedside.  She is sedated on Precedex however she arouses to voices.  Denies having any pain.  Her abdomen is distended.     Subjective: 1/19 tachypneic, hypertensive, elevated temp (37.9 C).  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  Treat underlying causes -Titrate O2 to maintain SPO2> 92% for comfort -1/19 PCXR pending - 1/19 sputum and respiratory virus panel pending - Flutter valve - Incentive spirometry -DuoNeb QID  Metastatic small cell cancer/lung mass/infiltrate -1/18 patient now comfort care -1/19 patient and [name] (daughter) reversed  comfort care patient now DNR.  Would like everything that can be treated to be treated. -1/19 per Dr. Truitt Merle oncology note on 1/16, given patient's multiorgan failure and overall poor performance status, she is not a candidate for additional chemotherapy.   Agitation delirium/acute hepatic encephalopathy, sedated on Precedex drip. -1/18 agitation resolved   Refractory hypokalemia, likely from GI losses -1/18 patient now comfort care  -1/19 patient and [name] (daughter) reversed comfort care patient now DNR.  Would like everything that can be treated to be treated.  Labs pending  Hypophosphatemia -1/18 patient now comfort care -1/19 patient and [name] (daughter) reversed comfort care patient now DNR.  Would like everything that can be treated to be treated.  Labs pending  Mild non-anion gap metabolic acidosis -0/25 patient now comfort care -1/19 patient and [name] (daughter) reversed comfort care patient now DNR.  Would like everything that can be treated to be treated.  Labs pending   Hypernatremia, acute -1/18 patient now comfort care   -1/19 patient and [name] (daughter) reversed comfort care patient now DNR.  Would like everything that can be treated to be treated.  Labs pending  Leukopenia/Neutropenia -1/18 patient now comfort care -1/19 patient and [name] (daughter) reversed comfort care patient now DNR.  Would like everything that can be treated to be treated.  Labs pending  Elevated liver chemistries in the setting of liver metastasis. -1/18 patient now comfort care  -1/19 patient and [name] (daughter) reversed comfort care patient now DNR.  Would like everything that can be treated to be treated.  Labs pending  Hyperbilirubinemia -1/18 patient now comfort care  -1/19 patient and [name] (daughter) reversed comfort care patient now DNR.  Would like everything that can be treated to be treated.  Labs pending   AKI -Likely secondary to intravascular volume depletion -1/18  patient now comfort care  -1/19 patient and [name] (daughter) reversed comfort care patient now DNR.  Would like everything that can be treated to be treated.  Labs pending   Severe protein calorie malnutrition -1/18 patient now comfort care  -1/19 Speech evaluation: Recommends dysphagia 3: Thin liquid diet  Acute blood loss anemia, rule out intra-abdominal bleed -1/18 patient now comfort care  -1/19 patient and [name] (daughter) reversed comfort care patient now DNR.  Would like everything that can be treated to be treated.  Labs pending -Transfuse for hemoglobin<7.5 per oncology  Sepsis, suspect intra-abdominal source -1/18 patient now comfort care  -1/19 patient and [name] (daughter) reversed comfort care patient now DNR.  Would like everything that can be treated to be treated.  Labs pending   Small abdominal ascites in the setting of recently diagnosed mets to the liver -1/18 patient now comfort care  -1/19 patient and [name] (daughter) reversed comfort care patient now DNR.  Would like everything that can be treated to be treated.  Labs pending  Treated coagulopathy in the setting of advanced liver disease -1/18 patient now comfort care   Uncontrolled HTN/Sinus Tachycardia  -1/18 patient now comfort care. -Amlodipine 5 mg daily - Metoprolol IV 5 mg QID - Metoprolol PRN SBP> 150 or HR> 130  Chronic Thrombocytopenia in the setting of advanced liver disease -1/13 transfuse 1 unit of platelet  -1/18 patient now comfort care -1/19 patient and [name] (daughter) reversed comfort care patient now DNR.  Would like everything that can be treated to be treated.  Labs pending   Improving pancytopenia likely secondary to recent chemotherapy -1/18 patient now comfort care -1/19 patient and [name] (daughter) reversed comfort care patient now DNR.  Would like everything that can be treated to be treated.  Labs pending -Per oncology transfuse for platelet count<20,000 or active bleeding    Chronic pain syndrome -1/18 patient now comfort care -1/19 patient and [name] (daughter) reversed comfort care patient now DNR.  Would like everything that can be treated to be treated.  Labs pending  Polysubstance abuse including cocaine and alcohol. -1/18 patient now comfort care   Goals of care Palliative care team consulted to assist with establishing goals of care Very poor prognosis in the setting of advanced cancer, decompensated advanced liver disease, liver metastasis, ongoing intermittent alcohol use, drug use. Made DNR on 07/29/2021 -1/19 patient and [name] (daughter) reversed comfort care patient now DNR.  Would like everything that can be treated to be treated.  Labs pending     DVT prophylaxis:  Code Status: Comfort care Family Communication: 1/19 [name] (daughter), and son at bedside for discussion of plan of care, all questions answered  Status is: Inpatient    Dispo: The patient is from: Home              Anticipated d/c is to:  Home with hospice?  We will await recommendations from palliative care              Anticipated d/c date is: 2 days              Patient currently is not medically stable to d/c.      Consultants:  Palliative care PCCM  Procedures/Significant Events:  I have personally reviewed and interpreted all radiology studies and my findings are as above.  VENTILATOR SETTINGS: Nasal cannula 1/19 Flow 2 L/min SPO2 92%   Cultures   Antimicrobials:    Devices    LINES / TUBES:    Continuous Infusions:  sodium chloride Stopped (07/28/21 0342)     Objective: Vitals:   07/30/21 1200 07/30/21 1300 07/30/21 1448 07/31/21 1204  BP: (!) 170/92 (!) 180/90 (!) 171/93 (!) 142/86  Pulse: (!) 110 (!) 118 (!) 127 (!) 104  Resp: (!) 23 (!) 27 (!) 22 20  Temp:   100.2 F (37.9 C)   TempSrc:   Oral Oral  SpO2: 95% 97% 99% 92%  Weight:        Intake/Output Summary (Last 24 hours) at 07/31/2021 1606 Last data filed at 07/31/2021  0600 Gross per 24 hour  Intake 0 ml  Output 575 ml  Net -575 ml    Filed Weights   07/28/21 0500 07/29/21 0500 07/30/21 0500  Weight: 49.6 kg 49 kg 52 kg    Examination:  General: A/O x3 (does not know when), positive acute respiratory distress, cachectic Eyes: negative scleral hemorrhage, negative anisocoria, positive icterus ENT: Negative Runny nose, negative gingival bleeding, Neck:  Negative scars, masses, torticollis, lymphadenopathy, JVD Lungs: decreased breath sounds bilaterally without wheezes or crackles Cardiovascular: Regular rate and rhythm without murmur gallop or rub normal S1 and S2 Abdomen: negative abdominal pain, positive distention,positive soft, bowel sounds, no rebound, no ascites, no appreciable mass Extremities: No significant cyanosis, clubbing, or edema bilateral lower extremities Skin: Negative rashes, lesions, ulcers Psychiatric:  Negative depression, negative anxiety, negative fatigue, negative mania  Central nervous system:  Cranial nerves II through XII intact, tongue/uvula midline, all extremities muscle strength 5/5, sensation intact throughout, negative dysarthria, negative expressive aphasia, negative receptive aphasia.  .     Data Reviewed: Care during the described time interval was provided by me .  I have reviewed this patient's available data, including medical history, events of note, physical examination, and all test results as part of my evaluation.  CBC: Recent Labs  Lab 07/26/21 0253 07/27/21 0251 07/28/21 0008 07/29/21 0609 07/30/21 0428  WBC 1.5* 1.3* 3.4* 5.7 15.6*  NEUTROABS 0.3* 0.2* 1.1* 2.7 9.0*  HGB 10.6* 8.1* 8.3* 8.1* 8.1*  HCT 28.9* 22.3* 23.2* 23.6* 23.6*  MCV 83.3 84.2 85.3 87.7 87.1  PLT 50* 38* 53* 61* 70*    Basic Metabolic Panel: Recent Labs  Lab 07/26/21 0253 07/27/21 0251 07/28/21 0327 07/29/21 0609 07/30/21 0428  NA 141 144 147* 146* 145  K 3.7 2.6* 4.2 3.2* 4.9  CL 113* 112* 119* 118* 118*   CO2 19* 23 20* 21* 20*  GLUCOSE 136* 157* 158* 171* 124*  BUN 33* 29* 38* 36* 35*  CREATININE 0.84 0.90 1.23* 1.05* 1.06*  CALCIUM 8.3* 8.0* 8.1* 8.2* 8.4*  MG 1.7 2.1 2.2 2.1 3.2*  PHOS 3.2 3.4 2.5 2.0* 5.3*    GFR: Estimated Creatinine Clearance: 42.4 mL/min (A) (by C-G formula based on SCr of 1.06 mg/dL (H)). Liver Function Tests: Recent Labs  Lab 07/26/21 0253 07/27/21 0251 07/28/21 0327 07/29/21 0609 07/30/21 0428  AST 89* 64* 89* 123* 101*  ALT 59* 50* 56* 69* 68*  ALKPHOS 421* 359* 465* 542* 538*  BILITOT 14.2* 11.6* 11.0* 10.5* 10.7*  PROT 5.5* 4.8* 4.8* 5.0* 5.0*  ALBUMIN 2.5* 2.1* 2.1* 2.1* 2.2*    No results for input(s): LIPASE, AMYLASE in the last 168 hours. Recent  Labs  Lab 07/25/21 0713 07/28/21 0327 07/30/21 0427  AMMONIA 94* 97* 92*    Coagulation Profile: Recent Labs  Lab 07/26/21 0345 07/27/21 0251 07/28/21 0327 07/29/21 0609 07/30/21 0644  INR 1.4* 1.4* 1.5* 1.4* 1.3*    Cardiac Enzymes: Recent Labs  Lab 07/27/21 0251  CKTOTAL 80  CKMB 2.1    BNP (last 3 results) No results for input(s): PROBNP in the last 8760 hours. HbA1C: No results for input(s): HGBA1C in the last 72 hours.  CBG: Recent Labs  Lab 07/29/21 2328 07/30/21 0335 07/30/21 0832 07/31/21 0722 07/31/21 1136  GLUCAP 103* 124* 93 71 87    Lipid Profile: No results for input(s): CHOL, HDL, LDLCALC, TRIG, CHOLHDL, LDLDIRECT in the last 72 hours. Thyroid Function Tests: No results for input(s): TSH, T4TOTAL, FREET4, T3FREE, THYROIDAB in the last 72 hours. Anemia Panel: No results for input(s): VITAMINB12, FOLATE, FERRITIN, TIBC, IRON, RETICCTPCT in the last 72 hours. Sepsis Labs: Recent Labs  Lab 07/24/21 1623 07/25/21 0253 07/27/21 0251  PROCALCITON  --  2.55  --   LATICACIDVEN 1.5  --  1.5     Recent Results (from the past 240 hour(s))  Urine Culture     Status: Abnormal   Collection Time: 07/24/21  1:49 PM   Specimen: In/Out Cath Urine  Result  Value Ref Range Status   Specimen Description   Final    IN/OUT CATH URINE Performed at [facility], [place] [address] [place], [place] [address]    Special Requests   Final    Immunocompromised Performed at [facility], [place] [address] [place], [place] [address]    Culture MULTIPLE SPECIES PRESENT, SUGGEST RECOLLECTION (A)  Final   Report Status 07/26/2021 FINAL  Final  Blood Culture (routine x 2)     Status: None   Collection Time: 07/24/21  2:20 PM   Specimen: BLOOD  Result Value Ref Range Status   Specimen Description   Final    BLOOD BLOOD LEFT FOREARM Performed at [place] [address], [place], [place] [address]    Special Requests   Final    BOTTLES DRAWN AEROBIC AND ANAEROBIC Blood Culture results may not be optimal due to an inadequate volume of blood received in culture bottles Performed at [place] [address], [place], [place] [address]    Culture   Final    NO GROWTH 5 DAYS Performed at [facility], [place] [address] [place], [place] [address]    Report Status 07/29/2021 FINAL  Final  Blood Culture (routine x 2)     Status: None   Collection Time: 07/24/21  2:25 PM   Specimen: BLOOD  Result Value Ref Range Status   Specimen Description   Final    BLOOD BLOOD RIGHT FOREARM Performed at [facility] [address] [place], [place] [address]    Special Requests   Final    BOTTLES DRAWN AEROBIC AND ANAEROBIC Blood Culture results may not be optimal due to an inadequate volume of blood received in culture bottles Performed at [place] [address], [place], [place] [address]    Culture   Final    NO GROWTH 5 DAYS Performed at [facility], [place] [address] [place], [place] [address]    Report Status 07/29/2021 FINAL  Final  Resp Panel by RT-PCR (Flu A&B, Covid) Nasopharyngeal Swab     Status: None  Collection Time:  07/24/21  2:36 PM   Specimen: Nasopharyngeal Swab; Nasopharyngeal(NP) swabs in vial transport medium  Result Value Ref Range Status   SARS Coronavirus 2 by RT PCR NEGATIVE NEGATIVE Final    Comment: (NOTE) SARS-CoV-2 target nucleic acids are NOT DETECTED.  The SARS-CoV-2 RNA is generally detectable in upper respiratory specimens during the acute phase of infection. The lowest concentration of SARS-CoV-2 viral copies this assay can detect is 138 copies/mL. A negative result does not preclude SARS-Cov-2 infection and should not be used as the sole basis for treatment or other patient management decisions. A negative result may occur with  improper specimen collection/handling, submission of specimen other than nasopharyngeal swab, presence of viral mutation(s) within the areas targeted by this assay, and inadequate number of viral copies(<138 copies/mL). A negative result must be combined with clinical observations, patient history, and epidemiological information. The expected result is Negative.  Fact Sheet for Patients:  EntrepreneurPulse.com.au  Fact Sheet for Healthcare Providers:  IncredibleEmployment.be  This test is no t yet approved or cleared by the [place] FDA and  has been authorized for detection and/or diagnosis of SARS-CoV-2 by FDA under an Emergency Use Authorization (EUA). This EUA will remain  in effect (meaning this test can be used) for the duration of the COVID-19 declaration under Section 564(b)(1) of the Act, 21 U.S.C.section 360bbb-3(b)(1), unless the authorization is terminated  or revoked sooner.       Influenza A by PCR NEGATIVE NEGATIVE Final   Influenza B by PCR NEGATIVE NEGATIVE Final    Comment: (NOTE) The Xpert Xpress SARS-CoV-2/FLU/RSV plus assay is intended as an aid in the diagnosis of influenza from Nasopharyngeal swab specimens and should not be used as a sole basis for treatment. Nasal washings  and aspirates are unacceptable for Xpert Xpress SARS-CoV-2/FLU/RSV testing.  Fact Sheet for Patients: EntrepreneurPulse.com.au  Fact Sheet for Healthcare Providers: IncredibleEmployment.be  This test is not yet approved or cleared by the [place] FDA and has been authorized for detection and/or diagnosis of SARS-CoV-2 by FDA under an Emergency Use Authorization (EUA). This EUA will remain in effect (meaning this test can be used) for the duration of the COVID-19 declaration under Section 564(b)(1) of the Act, 21 U.S.C. section 360bbb-3(b)(1), unless the authorization is terminated or revoked.  Performed at [facility], [place] [address], [place], [place] [address]   MRSA Next Gen by PCR, Nasal     Status: None   Collection Time: 07/24/21  6:56 PM   Specimen: Nasal Mucosa; Nasal Swab  Result Value Ref Range Status   MRSA by PCR Next Gen NOT DETECTED NOT DETECTED Final    Comment: (NOTE) The GeneXpert MRSA Assay (FDA approved for NASAL specimens only), is one component of a comprehensive MRSA colonization surveillance program. It is not intended to diagnose MRSA infection nor to guide or monitor treatment for MRSA infections. Test performance is not FDA approved in patients less than 26 years old. Performed at [facility], [place] [address], [place], [place] [address]           Radiology Studies: DG Abd 1 View  Result Date: 07/29/2021 CLINICAL DATA:  NG tube placement. EXAM: ABDOMEN - 1 VIEW COMPARISON:  07/26/2021 FINDINGS: The tip of an endotracheal tube projects near the GE junction with side hole overlying the distal esophagus. The tube is more proximally located than on the prior study. A right PICC terminates near the superior cavoatrial junction/high right atrium. There is  mild gaseous distension of the splenic flexure of the colon. There is persistent right suprahilar consolidation, more fully  evaluated on a recent chest CTA. IMPRESSION: Endotracheal tube terminating near the GE junction. Advancement by 10 cm would place both the tip and side hole well within the stomach. Electronically Signed   By: Logan Bores M.D.   On: 07/29/2021 17:42        Scheduled Meds:  amLODipine  5 mg Oral Daily   atenolol  12.5 mg Oral Daily    Continuous Infusions:  sodium chloride Stopped (07/28/21 0342)     LOS: 7 days    Time spent:40 min   Safiatou Islam, Geraldo Docker, MD [facility]   If 7PM-7AM, please contact night-coverage 07/31/2021, 4:06 PM

## 2021-07-31 NOTE — Progress Notes (Signed)
Daily Progress Note   Patient Name: Meredith Goodman       Date: 07/31/2021 DOB: April 20, 1956  Age: 66 y.o. MRN#: [id number] Attending Physician: Allie Bossier, MD Primary Care Physician: Pcp, No Admit Date: 07/24/2021  Reason for Consultation/Follow-up: Establishing goals of care  Subjective: Patient is now on the 5th floor, she awakens easily, complains that she has not been given anything to eat other than applesauce, her daughter is at bedside, her sister is at bedside, daughter wishes to re discuss goals of care and treatment plan for the patient, see below.    Length of Stay: 7  Current Medications: Scheduled Meds:   amLODipine  5 mg Oral Daily   atenolol  12.5 mg Oral Daily    Continuous Infusions:  sodium chloride Stopped (07/28/21 0342)    PRN Meds: sodium chloride, fentaNYL (SUBLIMAZE) injection, lip balm, LORazepam, metoprolol tartrate, metoprolol tartrate, prochlorperazine, sodium chloride flush  Physical Exam     General: awake alert no distress Chronically ill-appearing Regular work of breathing No edema Abdomen not tender.     Vital Signs: BP (!) 171/93 (BP Location: Left Arm)    Pulse (!) 127    Temp 100.2 F (37.9 C) (Oral)    Resp (!) 22    Wt 52 kg    SpO2 99%    BMI 20.97 kg/m  SpO2: SpO2: 99 % O2 Device: O2 Device: Nasal Cannula O2 Flow Rate: O2 Flow Rate (L/min): 2 L/min  Intake/output summary:  Intake/Output Summary (Last 24 hours) at 07/31/2021 1046 Last data filed at 07/31/2021 0600 Gross per 24 hour  Intake 121.43 ml  Output 575 ml  Net -453.57 ml    LBM: Last BM Date:  (noted small stool in flexiseal) Baseline Weight: Weight: 48.3 kg Most recent weight: Weight: 52 kg       Palliative Assessment/Data:    Flowsheet Rows    Flowsheet  Row Most Recent Value  Intake Tab   Referral Department Hospitalist  Unit at Time of Referral ICU  Palliative Care Primary Diagnosis Cancer  Date Notified 07/25/21  Palliative Care Type New Palliative care  Reason for referral Clarify Goals of Care  Date of Admission 07/24/21  Date first seen by Palliative Care 07/26/21  # of days Palliative referral response time 1 Day(s)  # of days  IP prior to Palliative referral 1  Clinical Assessment   Palliative Performance Scale Score 10%  Psychosocial & Spiritual Assessment   Palliative Care Outcomes   Patient/Family meeting held? No       Patient Active Problem List   Diagnosis Date Noted   Acute on chronic respiratory failure with hypoxia ([place]) 07/30/2021   Malignant carcinoid tumor of the ileum ([place]) 07/30/2021   Small cell lung cancer ([place]) 07/30/2021   Essential hypertension 07/30/2021   Substance abuse ([place]) 07/30/2021   Cocaine abuse ([place]) 07/30/2021   Protein-calorie malnutrition, severe 07/25/2021   Acute respiratory failure with hypoxia ([place]) 07/24/2021   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[place]), stage IV 07/16/2021   Liver metastases ([place])    Hyperlipidemia 11/28/2019   Malignant carcinoid tumor of ileum ([place]) 08/30/2019   SYPHILIS 05/12/2007   MARIJUANA ABUSE 05/12/2007   COCAINE ABUSE 05/12/2007   HYPERTENSION 05/12/2007    Palliative Care Assessment & Plan   Patient Profile: 66 y.o. female  with past medical history of hypertension, carcinoid tumor of the ileum, small cell lung cancer, recent discovery of liver mets who recently started on chemotherapy with Dr. Burr Medico admitted on 07/24/2021 with shortness of breath.  CTA ruled out PE but showed right upper lobe mass versus pneumonia.  Her mental status has declined and she was obtunded but then became very combative and currently on Precedex infusion.  She was evaluated by GI due to decompensated liver with worsening pancytopenia and concern for hepatic encephalopathy.   Dr. Burr Medico evaluated her and discussed poor prognosis with patient and her son.  Recommendation for DNR/DNI was made.  She does remain a full code at this time.  Noted to have found white rock substance in her room and UDS was positive for cocaine.  Precedex started 1/14 due to continued agitation encephalopathy.    Recommendations/Plan: Family meeting with the patient's daughter   held: Comfort feeds: discussed with SLP colleague who will evaluate the patient today.  While the overall focus is on avoiding pain and suffering, patient's daughter wishes for some medical treatments to continue. We talked about resuming her home medications, particularly her BP medications.  PT consult Monitor hospital course Consider  SNF rehab with palliative on discharge,  PMT to follow.      Code Status: now DNR 07-29-21.     Code Status Orders  (From admission, onward)           Start     Ordered   07/24/21 1728  Full code  Continuous        07/24/21 1727           Code Status History     This patient has a current code status but no historical code status.       Prognosis: Guarded  Discharge Planning: To Be Determined  Care plan was discussed with daughter, Dr Sherral Hammers and also various members of the IDT  Thank you for allowing the Palliative Medicine Team to assist in the care of this patient.   Time In: 9 Time Out: 9.35 Total Time 35 Prolonged Time Billed No   Loistine Chance, MD  Please contact Palliative Medicine Team phone at [phone] for questions and concerns.
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
## 2021-08-01 DIAGNOSIS — J9621 Acute and chronic respiratory failure with hypoxia: Secondary | ICD-10-CM | POA: Diagnosis not present

## 2021-08-01 DIAGNOSIS — Z720 Tobacco use: Secondary | ICD-10-CM

## 2021-08-01 DIAGNOSIS — F1010 Alcohol abuse, uncomplicated: Secondary | ICD-10-CM

## 2021-08-01 DIAGNOSIS — E43 Unspecified severe protein-calorie malnutrition: Secondary | ICD-10-CM | POA: Diagnosis not present

## 2021-08-01 DIAGNOSIS — C801 Malignant (primary) neoplasm, unspecified: Secondary | ICD-10-CM | POA: Diagnosis not present

## 2021-08-01 DIAGNOSIS — R180 Malignant ascites: Secondary | ICD-10-CM | POA: Diagnosis not present

## 2021-08-01 DIAGNOSIS — R0602 Shortness of breath: Secondary | ICD-10-CM | POA: Diagnosis not present

## 2021-08-01 LAB — CBC WITH DIFFERENTIAL/PLATELET
Abs Immature Granulocytes: 2.4 10*3/uL — ABNORMAL HIGH (ref 0.00–0.07)
Basophils Absolute: 0.3 10*3/uL — ABNORMAL HIGH (ref 0.0–0.1)
Basophils Relative: 1 %
Eosinophils Absolute: 0.3 10*3/uL (ref 0.0–0.5)
Eosinophils Relative: 1 %
HCT: 20.6 % — ABNORMAL LOW (ref 36.0–46.0)
Hemoglobin: 7 g/dL — ABNORMAL LOW (ref 12.0–15.0)
Lymphocytes Relative: 12 %
Lymphs Abs: 4.1 10*3/uL — ABNORMAL HIGH (ref 0.7–4.0)
MCH: 28.6 pg (ref 26.0–34.0)
MCHC: 34 g/dL (ref 30.0–36.0)
MCV: 84.1 fL (ref 80.0–100.0)
Metamyelocytes Relative: 1 %
Monocytes Absolute: 2.7 10*3/uL — ABNORMAL HIGH (ref 0.1–1.0)
Monocytes Relative: 8 %
Myelocytes: 5 %
Neutro Abs: 24.1 10*3/uL — ABNORMAL HIGH (ref 1.7–7.7)
Neutrophils Relative %: 71 %
Platelets: 81 10*3/uL — ABNORMAL LOW (ref 150–400)
Promyelocytes Relative: 1 %
RBC: 2.45 MIL/uL — ABNORMAL LOW (ref 3.87–5.11)
RDW: 23.7 % — ABNORMAL HIGH (ref 11.5–15.5)
WBC: 33.9 10*3/uL — ABNORMAL HIGH (ref 4.0–10.5)
nRBC: 1.4 % — ABNORMAL HIGH (ref 0.0–0.2)

## 2021-08-01 LAB — GLUCOSE, CAPILLARY
Glucose-Capillary: 114 mg/dL — ABNORMAL HIGH (ref 70–99)
Glucose-Capillary: 120 mg/dL — ABNORMAL HIGH (ref 70–99)
Glucose-Capillary: 163 mg/dL — ABNORMAL HIGH (ref 70–99)
Glucose-Capillary: 75 mg/dL (ref 70–99)
Glucose-Capillary: 94 mg/dL (ref 70–99)

## 2021-08-01 LAB — COMPREHENSIVE METABOLIC PANEL
ALT: 59 U/L — ABNORMAL HIGH (ref 0–44)
AST: 91 U/L — ABNORMAL HIGH (ref 15–41)
Albumin: 2.2 g/dL — ABNORMAL LOW (ref 3.5–5.0)
Alkaline Phosphatase: 652 U/L — ABNORMAL HIGH (ref 38–126)
Anion gap: 8 (ref 5–15)
BUN: 26 mg/dL — ABNORMAL HIGH (ref 8–23)
CO2: 22 mmol/L (ref 22–32)
Calcium: 8.4 mg/dL — ABNORMAL LOW (ref 8.9–10.3)
Chloride: 111 mmol/L (ref 98–111)
Creatinine, Ser: 0.81 mg/dL (ref 0.44–1.00)
GFR, Estimated: >60 mL/min (ref >60)
Glucose, Bld: 91 mg/dL (ref 70–99)
Potassium: 3.9 mmol/L (ref 3.5–5.1)
Sodium: 141 mmol/L (ref 135–145)
Total Bilirubin: 14.3 mg/dL — ABNORMAL HIGH (ref 0.3–1.2)
Total Protein: 5.2 g/dL — ABNORMAL LOW (ref 6.5–8.1)

## 2021-08-01 LAB — PROCALCITONIN: Procalcitonin: 3.04 ng/mL

## 2021-08-01 LAB — PREPARE RBC (CROSSMATCH)

## 2021-08-01 LAB — AMMONIA: Ammonia: 135 umol/L — ABNORMAL HIGH (ref 9–35)

## 2021-08-01 LAB — MAGNESIUM: Magnesium: 2 mg/dL (ref 1.7–2.4)

## 2021-08-01 LAB — PHOSPHORUS: Phosphorus: 2.8 mg/dL (ref 2.5–4.6)

## 2021-08-01 MED ORDER — LACTULOSE 10 GM/15ML PO SOLN
30.0000 g | Freq: Two times a day (BID) | ORAL | Status: DC
  Administered 2021-08-01 (×2): 30 g via ORAL
  Filled 2021-08-01 (×2): qty 45

## 2021-08-01 MED ORDER — SODIUM CHLORIDE 0.9% IV SOLUTION: Freq: Once | INTRAVENOUS | Status: AC

## 2021-08-01 MED ORDER — METOPROLOL TARTRATE 25 MG PO TABS
25.0000 mg | ORAL_TABLET | Freq: Two times a day (BID) | ORAL | Status: DC
  Administered 2021-08-01 – 2021-08-02 (×2): 25 mg via ORAL
  Filled 2021-08-01 (×2): qty 1

## 2021-08-01 MED ORDER — RIFAXIMIN 550 MG PO TABS
550.0000 mg | ORAL_TABLET | Freq: Two times a day (BID) | ORAL | Status: DC
  Administered 2021-08-01 – 2021-08-02 (×3): 550 mg via ORAL
  Filled 2021-08-01 (×3): qty 1

## 2021-08-01 MED ORDER — IPRATROPIUM-ALBUTEROL 0.5-2.5 (3) MG/3ML IN SOLN
3.0000 mL | Freq: Three times a day (TID) | RESPIRATORY_TRACT | Status: DC
  Administered 2021-08-01 – 2021-08-02 (×2): 3 mL via RESPIRATORY_TRACT
  Filled 2021-08-01 (×2): qty 3

## 2021-08-01 MED ORDER — OXYCODONE HCL 5 MG PO TABS
10.0000 mg | ORAL_TABLET | Freq: Four times a day (QID) | ORAL | Status: DC | PRN
  Administered 2021-08-01 – 2021-08-02 (×2): 10 mg via ORAL
  Filled 2021-08-01 (×2): qty 2

## 2021-08-01 MED ORDER — AMLODIPINE BESYLATE 10 MG PO TABS
10.0000 mg | ORAL_TABLET | Freq: Every day | ORAL | Status: DC
  Administered 2021-08-02: 10 mg via ORAL
  Filled 2021-08-01: qty 1

## 2021-08-01 MED ORDER — AMLODIPINE BESYLATE 5 MG PO TABS
5.0000 mg | ORAL_TABLET | Freq: Once | ORAL | Status: AC
  Administered 2021-08-01: 5 mg via ORAL
  Filled 2021-08-01: qty 1

## 2021-08-01 NOTE — Progress Notes (Signed)
[facility] [employer] [facility]) Hospital Liaison Note  Received request from Transitions of Care Manager Dessa Phi, RN, for hospice services at home after discharge. Chart and patient information under review by [facility] physician. Hospice eligibility pending at this time.  Spoke with daughter [name] to initiate education related to hospice philosophy, services and team approach to care. Patient/family verbalized understanding of information provided. Per discussion, the plan for discharge is on 1.21.23 once DME is delivered.  DME needs discussed. Patient has the following equipment in the home: [facility], shower bench and walker. Patient/family requests the following equipment for delivery: hospital bed, OBT, wheelchair and oxygen. Address has been verified and is correct in the chart. [name] is the family contact to arrange time of equipment delivery.   Please send signed and completed DNR home with patient/family. Please provide prescriptions at discharge as needed to ensure ongoing symptom management.   ACC information and contact numbers given to family. Above information shared with [place].   Please do not hesitate to call with any hospice related questions or concerns.   Thank you for the opportunity to participate in this patient's care.   Nadene Rubins, RN, BSN [place] Liaison [phone]

## 2021-08-01 NOTE — Progress Notes (Signed)
Report given to [facility] RN. All questions were answered. Completed first unit of RBC. Informed receiving nurse to give second unit.   Bobie Kistler.

## 2021-08-01 NOTE — Progress Notes (Signed)
PT Cancellation Note  Patient Details Name: Meredith Goodman MRN: [id number] DOB: Aug 23, 1955   Cancelled Treatment:    Reason Eval/Treat Not Completed: Other (comment) patient soundly asleep and RN reports they are just starting blood, would be better for PT to return later in the day if able. Will attempt if time/schedule allow.   Windell Norfolk, DPT, PN2   Supplemental Physical Therapist [place]    Pager [phone] Acute Rehab Office [phone]

## 2021-08-01 NOTE — Progress Notes (Signed)
Daily Progress Note   Patient Name: Meredith Goodman       Date: 08/01/2021 DOB: 24-Aug-1955  Age: 66 y.o. MRN#: [id number] Attending Physician: Allie Bossier, MD Primary Care Physician: Pcp, No Admit Date: 07/24/2021  Reason for Consultation/Follow-up: Establishing goals of care  Subjective: Patient is resting in bed, did not rest well overnight, chart reviewed, discussed with daughter and other members of the health care team, see below.      Length of Stay: 8  Current Medications: Scheduled Meds:   sodium chloride   Intravenous Once   amLODipine  5 mg Oral Daily   ipratropium-albuterol  3 mL Nebulization QID   lactulose  30 g Oral BID   metoprolol tartrate  5 mg Intravenous Q6H   rifaximin  550 mg Oral BID    Continuous Infusions:  sodium chloride Stopped (07/28/21 0342)    PRN Meds: sodium chloride, fentaNYL (SUBLIMAZE) injection, lip balm, LORazepam, metoprolol tartrate, prochlorperazine, sodium chloride flush  Physical Exam     General: awakens easily no distress Chronically ill-appearing Regular work of breathing No edema Abdomen not tender.     Vital Signs: BP 140/85 (BP Location: Left Arm)    Pulse (!) 116    Temp 98.3 F (36.8 C) (Oral)    Resp 18    Wt 52 kg    SpO2 94%    BMI 20.97 kg/m  SpO2: SpO2: 94 % O2 Device: O2 Device: Room Air O2 Flow Rate: O2 Flow Rate (L/min): 2 L/min  Intake/output summary:  Intake/Output Summary (Last 24 hours) at 08/01/2021 1314 Last data filed at 08/01/2021 0830 Gross per 24 hour  Intake 120 ml  Output 900 ml  Net -780 ml    LBM: Last BM Date: 07/31/21 Baseline Weight: Weight: 48.3 kg Most recent weight: Weight: 52 kg       Palliative Assessment/Data:    Flowsheet Rows    Flowsheet Row Most Recent Value   Intake Tab   Referral Department Hospitalist  Unit at Time of Referral ICU  Palliative Care Primary Diagnosis Cancer  Date Notified 07/25/21  Palliative Care Type New Palliative care  Reason for referral Clarify Goals of Care  Date of Admission 07/24/21  Date first seen by Palliative Care 07/26/21  # of days Palliative referral response time 1 Day(s)  #  of days IP prior to Palliative referral 1  Clinical Assessment   Palliative Performance Scale Score 10%  Psychosocial & Spiritual Assessment   Palliative Care Outcomes   Patient/Family meeting held? 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 female  with past medical history of hypertension, carcinoid tumor of the ileum, small cell lung cancer, recent discovery of liver mets who recently started on chemotherapy with Dr. Burr Medico admitted on 07/24/2021 with shortness of breath.  CTA ruled out PE but showed right upper lobe mass versus pneumonia.  Her mental status has declined and she was obtunded but then became very combative and currently on Precedex infusion.  She was evaluated by GI due to decompensated liver with worsening pancytopenia and concern for hepatic encephalopathy.  Dr. Burr Medico evaluated  her and discussed poor prognosis with patient and her son.  Recommendation for DNR/DNI was made.  She does remain a full code at this time.  Noted to have found [place] substance in her room and UDS was positive for cocaine.  Precedex started 1/14 due to continued agitation encephalopathy.    Recommendations/Plan: Family meeting with the patient's daughter held: Comfort feeds. Continue current mode of care, patient's family is appreciative of current medical measures and reasonable interventions that are being done such as PRBC, electrolyte replacements etc.  Disposition: home with hospice, discussed in detail with patient's daughter [name] [facility] consult.      Code Status: now DNR 07-29-21.     Code Status Orders  (From admission, onward)           Start     Ordered   07/24/21 1728  Full code  Continuous        07/24/21 1727           Code Status History     This patient has a current code status but no historical code status.       Prognosis: Guarded  Discharge Planning: To Be Determined  Care plan was discussed with daughter, Dr Burr Medico and Dr Sherral Hammers and also various members of the IDT  Thank you for allowing the Palliative Medicine Team to assist in the care of this patient.   Time In: 9 Time Out: 9.35 Total Time 35 Prolonged Time Billed No   Loistine Chance, MD  Please contact Palliative Medicine Team phone at [phone] for questions and concerns.

## 2021-08-01 NOTE — TOC Progression Note (Addendum)
Transition of Care [facility]) - Progression Note    Patient Details  Name: Meredith Goodman MRN: [id number] Date of Birth: 19-Mar-1956  Transition of [facility]) CM/SW Contact  Plez Belton, Juliann Pulse, RN Phone Number: 08/01/2021, 1:59 PM  Clinical Narrative:  Referral for home w/hospice-spoke to dtr [name]-agree to home w/hospice, & [facility] to eval-TC rep Shanita for eval, & acceptance-await outcome.DME needed:hospital bed,02,w/c,bedside table. PTAR needed @ d/c-confirmed address. Likely d/c n am.  -Noted [facility] not in network.Spoke to [place] dtr-in agreement to [place] to eval-await eval from [place].   Expected Discharge Plan: Home w Hospice Care Barriers to Discharge: Continued Medical Work up  Expected Discharge Plan and Services Expected Discharge Plan: [place]   Discharge Planning Services: CM Consult   Living arrangements for the past 2 months: Apartment                                       Social Determinants of Health (SDOH) Interventions    Readmission Risk Interventions No flowsheet data found.

## 2021-08-01 NOTE — Progress Notes (Signed)
UPDATE:   [facility] [address] [facility]) Hospital Liaison Note   Notified that [facility] is not in network with patient's insurance. Discussed with patient's daughter [name]. [name] has decided to go with a hospice agency in network with patient's insurance. Hospice referral with [facility] closed at this time.    Please do not hesitate to call with any hospice related questions or concerns.    Thank you,   Nadene Rubins, RN, BSN [facility] Liaison [phone]

## 2021-08-01 NOTE — Progress Notes (Addendum)
PROGRESS NOTE    Lus Kriegel  ZSW:[id number] DOB: 10/18/1955 DOA: 07/24/2021 PCP: Pcp, No     Brief Narrative:  66 y.o. 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     Work-up revealed hepatic encephalopathy, acute hypoxic respiratory failure secondary to right upper lobe pneumonia versus right upper lobe lung mass, decompensated liver failure, coagulopathy.  Seen by medical oncology and GI.  Patient has a very poor prognosis, therefore palliative care was consulted.     UDS positive for opiates and cocaine on 07/24/2021.   Hospital course complicated by agitation delirium for which she was started on Precedex drip and transferred to ICU.  PICC line and NG tube placed on 07/27/2021.   07/29/2021: Seen and examined at her bedside.  She is sedated on Precedex however she arouses to voices.  Denies having any pain.  Her abdomen is distended.     Subjective: 1/20 afebrile overnight patient lying in bed complaining of back pain.  Receiving her second unit of PRBC.   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  Treat underlying causes -Titrate O2 to maintain SPO2> 92% for comfort -1/19 PCXR pending - 1/19 sputum and respiratory virus panel pending - Flutter valve - Incentive spirometry -DuoNeb QID  Metastatic small cell cancer/lung mass/infiltrate -1/18 patient now comfort care -1/19 patient and [name]  (daughter) reversed comfort care patient now DNR.  Would like everything that can be treated to be treated. -1/19 per Dr. Truitt Merle oncology note on 1/16, given patient's multiorgan failure and overall poor performance status, she is not a candidate for additional chemotherapy.  Sepsis, suspect intra-abdominal source -1/18 patient now comfort care  -1/19 patient and [name] (daughter) reversed comfort care patient now DNR.  Would like everything that can be treated to be treated.  Labs pending -1/20 DOES NOT meet criteria for sepsis.  Elevated    Agitation delirium/acute Hepatic encephalopathy -1/18 agitation resolved  Elevated Ammonia - Secondary to liver metastasis - 1/20 monitor ammonia level currently= 135 - 1/20 Lactulose 30 g BID  - 1/20 Rifaximin 550mg  BID   Refractory hypokalemia, likely from GI losses -1/18 patient now comfort care  -1/19 patient and [name] (daughter) reversed comfort care patient now DNR.  Would like everything that can be treated to be treated.  Labs pending -1/20 resolved  Hypophosphatemia -1/18 patient now comfort care -1/19 patient and [name] (daughter) reversed comfort care patient now DNR.  Would like everything that can be treated to be treated.  Labs pending -12/20 resolved  Mild non-anion gap metabolic acidosis -3/22 patient now comfort care -1/19 patient and [name] (daughter) reversed comfort care patient now DNR.  Would like everything that can be treated to be treated.  Labs pending   Hypernatremia, acute -1/18 patient now comfort care   -1/19 patient and [name] (daughter) reversed comfort care patient now DNR.  Would like everything that can be treated to be treated.  Labs pending -12/20 resolved  Leukocytosis/Neutropenia -1/9 per oncology office visit received dose of Neulasta -1/18 patient now comfort care -1/19 patient and [name] (daughter) reversed comfort care patient now DNR.  Would like everything that can be treated to be treated.   Labs pending -9/20 patient with significant leukocytosis, negative fever, normal lactic acid  Elevated liver chemistries in the setting of liver metastasis. -1/18 patient now comfort care  -1/19 patient and [name] (daughter) reversed comfort care patient now DNR.  Would like everything that can be treated to be treated.  Labs pending -12/20 patient internal phase of metastatic lung cancer expect that her liver enzymes will continue to remain elevated.  Follow-up with oncology upon discharge   Hyperbilirubinemia -1/18 patient now comfort care  -1/19 patient and [name] (daughter) reversed comfort care patient now DNR.  Would like everything that can be treated to be treated.  Labs pending   AKI -Likely secondary to intravascular volume depletion -1/18 patient now comfort care  -1/19 patient and [name] (daughter) reversed comfort care patient now DNR.  Would like everything that can be treated to be treated.  Labs pending Lab Results  Component Value Date   CREATININE 0.81 08/01/2021   CREATININE 1.05 (H) 07/31/2021   CREATININE 1.06 (H) 07/30/2021   CREATININE 1.05 (H) 07/29/2021   CREATININE 1.23 (H) 07/28/2021  -1/20 resolved   Severe protein calorie malnutrition -1/18 patient now comfort care  -1/19 Speech evaluation: Recommends dysphagia 3: Thin liquid diet  Acute blood loss anemia, rule out intra-abdominal bleed -1/18 patient now comfort care  -1/19 patient and [name] (daughter) reversed comfort care patient now DNR.  Would like everything that can be treated to be treated.  Labs pending -Transfuse for hemoglobin<7.5 per oncology -1/20 transfused 2 units PRBC   Small abdominal ascites  -1/13 US abdomen shows trace amount of ascites -1/18 patient now comfort care   Treated coagulopathy in the setting of advanced liver disease -1/18 patient now comfort care   Uncontrolled HTN/Sinus Tachycardia  -1/18 patient now comfort care. - 1/20 increase Amlodipine 10 mg daily - 1/20  Metoprolol 25 BID  Chronic Thrombocytopenia in the setting of advanced liver disease -1/13 transfuse 1 unit of platelet  -1/18 patient now comfort care -1/19 patient and [name] (daughter) reversed comfort care patient now DNR.  Would like everything that can be treated to be treated.  Labs pending -Per oncology transfuse for platelet count<20,000 or active bleeding   Improving pancytopenia likely secondary to recent chemotherapy -1/18 patient now comfort care -1/19 patient and [name] (daughter) reversed comfort care patient now DNR.  Would like everything that can be treated to be treated.  Labs pending    Chronic pain syndrome -1/18 patient now comfort care -1/19 patient and [name] (daughter) reversed comfort care patient now DNR.  Would like everything that can be treated to be treated.  Labs pending -1/20 Oxycodone 10 mg QID PRN.  Patient will need to follow-up with oncology or establish care with pain clinic to obtain long-term pain medication.  Especially given her polysubstance abuse (EtOH, cocaine) .  We will need to sign pain contract.  Polysubstance abuse including cocaine and alcohol. -1/18 patient now comfort care -EtOH and cocaine abuse.   Goals of care Palliative care team consulted to assist with establishing goals of care Very poor prognosis in the setting of advanced cancer, decompensated advanced liver disease, liver metastasis, ongoing intermittent alcohol use, drug use. Made DNR on 07/29/2021 -1/19 patient and [name] (daughter) reversed comfort  care patient now DNR.  Would like everything that can be treated to be treated.  Labs pending     DVT prophylaxis:  Code Status: Comfort care Family Communication: 1/19 [name] (daughter), and son at bedside for discussion of plan of care, all questions answered  Status is: Inpatient    Dispo: The patient is from: Home              Anticipated d/c is to:  Home with hospice?  We will await recommendations from palliative  care              Anticipated d/c date is: 2 days              Patient currently is not medically stable to d/c.      Consultants:  Palliative care PCCM  Procedures/Significant Events:    I have personally reviewed and interpreted all radiology studies and my findings are as above.  VENTILATOR SETTINGS: Nasal cannula 1/20 Flow 2 L/min SPO2 92%   Cultures   Antimicrobials:    Devices    LINES / TUBES:    Continuous Infusions:  sodium chloride Stopped (07/28/21 0342)     Objective: Vitals:   07/31/21 1710 07/31/21 2131 07/31/21 2359 08/01/21 0459  BP: (!) 157/89 (!) 145/79 137/84 (!) 148/72  Pulse: (!) 106 (!) 102 (!) 106 96  Resp: 16 15 15 14   Temp: 97.9 F (36.6 C) 99.7 F (37.6 C) 98.9 F (37.2 C) 98.6 F (37 C)  TempSrc:  Oral Oral Oral  SpO2: 94% 97% 97% 91%  Weight:        Intake/Output Summary (Last 24 hours) at 08/01/2021 0745 Last data filed at 08/01/2021 0330 Gross per 24 hour  Intake --  Output 900 ml  Net -900 ml    Filed Weights   07/28/21 0500 07/29/21 0500 07/30/21 0500  Weight: 49.6 kg 49 kg 52 kg    Examination:  General: A/O x4 , positive acute respiratory distress, cachectic, complains of back pain Eyes: negative scleral hemorrhage, negative anisocoria, positive icterus ENT: Negative Runny nose, negative gingival bleeding, Neck:  Negative scars, masses, torticollis, lymphadenopathy, JVD Lungs: decreased breath sounds bilaterally without wheezes or crackles Cardiovascular: Regular rate and rhythm without murmur gallop or rub normal S1 and S2 Abdomen: negative abdominal pain, positive distention,positive soft, bowel sounds, no rebound, no ascites, no appreciable mass Extremities: No significant cyanosis, clubbing, or edema bilateral lower extremities Skin: Negative rashes, lesions, ulcers Psychiatric:  Negative depression, negative anxiety, negative fatigue, negative mania  Central nervous system:  Cranial nerves II  through XII intact, tongue/uvula midline, all extremities muscle strength 5/5, sensation intact throughout, negative dysarthria, negative expressive aphasia, negative receptive aphasia.  .     Data Reviewed: Care during the described time interval was provided by me .  I have reviewed this patient's available data, including medical history, events of note, physical examination, and all test results as part of my evaluation.  CBC: Recent Labs  Lab 07/28/21 0008 07/29/21 0609 07/30/21 0428 07/31/21 1735 08/01/21 0442  WBC 3.4* 5.7 15.6* 31.9* 33.9*  NEUTROABS 1.1* 2.7 9.0* 19.8* 24.1*  HGB 8.3* 8.1* 8.1* 9.2* 7.0*  HCT 23.2* 23.6* 23.6* 26.4* 20.6*  MCV 85.3 87.7 87.1 84.9 84.1  PLT 53* 61* 70* 68* 81*    Basic Metabolic Panel: Recent Labs  Lab 07/28/21 0327 07/29/21 0609 07/30/21 0428 07/31/21 1735 08/01/21 0442  NA 147* 146* 145 143 141  K 4.2 3.2* 4.9 4.2 3.9  CL 119* 118* 118* 114* 111  CO2 20* 21* 20* 20* 22  GLUCOSE 158* 171* 124* 130* 91  BUN 38* 36* 35* 28* 26*  CREATININE 1.23* 1.05* 1.06* 1.05* 0.81  CALCIUM 8.1* 8.2* 8.4* 8.4* 8.4*  MG 2.2 2.1 3.2* 1.8 2.0  PHOS 2.5 2.0* 5.3* 2.9 2.8    GFR: Estimated Creatinine Clearance: 55.5 mL/min (by C-G formula based on SCr of 0.81 mg/dL). Liver Function Tests: Recent Labs  Lab 07/28/21 0327 07/29/21 0609 07/30/21 0428 07/31/21 1735 08/01/21 0442  AST 89* 123* 101* 92* 91*  ALT 56* 69* 68* 61* 59*  ALKPHOS 465* 542* 538* 595* 652*  BILITOT 11.0* 10.5* 10.7* 13.4* 14.3*  PROT 4.8* 5.0* 5.0* 5.3* 5.2*  ALBUMIN 2.1* 2.1* 2.2* 2.2* 2.2*    No results for input(s): LIPASE, AMYLASE in the last 168 hours. Recent Labs  Lab 07/28/21 0327 07/30/21 0427 08/01/21 0537  AMMONIA 97* 92* 135*    Coagulation Profile: Recent Labs  Lab 07/26/21 0345 07/27/21 0251 07/28/21 0327 07/29/21 0609 07/30/21 0644  INR 1.4* 1.4* 1.5* 1.4* 1.3*    Cardiac Enzymes: Recent Labs  Lab 07/27/21 0251  CKTOTAL 80   CKMB 2.1    BNP (last 3 results) No results for input(s): PROBNP in the last 8760 hours. HbA1C: No results for input(s): HGBA1C in the last 72 hours.  CBG: Recent Labs  Lab 07/31/21 1643 07/31/21 1959 08/01/21 0005 08/01/21 0500 08/01/21 0739  GLUCAP 119* 112* 114* 94 75    Lipid Profile: No results for input(s): CHOL, HDL, LDLCALC, TRIG, CHOLHDL, LDLDIRECT in the last 72 hours. Thyroid Function Tests: No results for input(s): TSH, T4TOTAL, FREET4, T3FREE, THYROIDAB in the last 72 hours. Anemia Panel: No results for input(s): VITAMINB12, FOLATE, FERRITIN, TIBC, IRON, RETICCTPCT in the last 72 hours. Sepsis Labs: Recent Labs  Lab 07/27/21 0251 07/31/21 1906 07/31/21 2255 08/01/21 0442  PROCALCITON  --  3.61  --  3.04  LATICACIDVEN 1.5 1.5 1.7  --      Recent Results (from the past 240 hour(s))  Urine Culture     Status: Abnormal   Collection Time: 07/24/21  1:49 PM   Specimen: In/Out Cath Urine  Result Value Ref Range Status   Specimen Description   Final    IN/OUT CATH URINE Performed at [facility], [place] [address] [place], [place] [address]    Special Requests   Final    Immunocompromised Performed at [facility], [place] [address] [place], [place] [address]    Culture MULTIPLE SPECIES PRESENT, SUGGEST RECOLLECTION (A)  Final   Report Status 07/26/2021 FINAL  Final  Blood Culture (routine x 2)     Status: None   Collection Time: 07/24/21  2:20 PM   Specimen: BLOOD  Result Value Ref Range Status   Specimen Description   Final    BLOOD BLOOD LEFT FOREARM Performed at [place] [address], [place], [place] [address]    Special Requests   Final    BOTTLES DRAWN AEROBIC AND ANAEROBIC Blood Culture results may not be optimal due to an inadequate volume of blood received in culture bottles Performed at [place] [address], [place], [place] [address]    Culture    Final    NO GROWTH 5 DAYS Performed at [facility], [place] [address] [place],  [address]    Report Status 07/29/2021 FINAL  Final  Blood Culture (routine x 2)  Status: None   Collection Time: 07/24/21  2:25 PM   Specimen: BLOOD  Result Value Ref Range Status   Specimen Description   Final    BLOOD BLOOD RIGHT FOREARM Performed at [place] [address], [place], [place] [address]    Special Requests   Final    BOTTLES DRAWN AEROBIC AND ANAEROBIC Blood Culture results may not be optimal due to an inadequate volume of blood received in culture bottles Performed at [place] [address] [place], [place] [address]    Culture   Final    NO GROWTH 5 DAYS Performed at [facility], [place] [address] [place], [place] [address]    Report Status 07/29/2021 FINAL  Final  Resp Panel by RT-PCR (Flu A&B, Covid) Nasopharyngeal Swab     Status: None   Collection Time: 07/24/21  2:36 PM   Specimen: Nasopharyngeal Swab; Nasopharyngeal(NP) swabs in vial transport medium  Result Value Ref Range Status   SARS Coronavirus 2 by RT PCR NEGATIVE NEGATIVE Final    Comment: (NOTE) SARS-CoV-2 target nucleic acids are NOT DETECTED.  The SARS-CoV-2 RNA is generally detectable in upper respiratory specimens during the acute phase of infection. The lowest concentration of SARS-CoV-2 viral copies this assay can detect is 138 copies/mL. A negative result does not preclude SARS-Cov-2 infection and should not be used as the sole basis for treatment or other patient management decisions. A negative result may occur with  improper specimen collection/handling, submission of specimen other than nasopharyngeal swab, presence of viral mutation(s) within the areas targeted by this assay, and inadequate number of viral copies(<138 copies/mL). A negative result must be combined with clinical observations, patient history, and  epidemiological information. The expected result is Negative.  Fact Sheet for Patients:  EntrepreneurPulse.com.au  Fact Sheet for Healthcare Providers:  IncredibleEmployment.be  This test is no t yet approved or cleared by the [place] FDA and  has been authorized for detection and/or diagnosis of SARS-CoV-2 by FDA under an Emergency Use Authorization (EUA). This EUA will remain  in effect (meaning this test can be used) for the duration of the COVID-19 declaration under Section 564(b)(1) of the Act, 21 U.S.C.section 360bbb-3(b)(1), unless the authorization is terminated  or revoked sooner.       Influenza A by PCR NEGATIVE NEGATIVE Final   Influenza B by PCR NEGATIVE NEGATIVE Final    Comment: (NOTE) The Xpert Xpress SARS-CoV-2/FLU/RSV plus assay is intended as an aid in the diagnosis of influenza from Nasopharyngeal swab specimens and should not be used as a sole basis for treatment. Nasal washings and aspirates are unacceptable for Xpert Xpress SARS-CoV-2/FLU/RSV testing.  Fact Sheet for Patients: EntrepreneurPulse.com.au  Fact Sheet for Healthcare Providers: IncredibleEmployment.be  This test is not yet approved or cleared by the [place] FDA and has been authorized for detection and/or diagnosis of SARS-CoV-2 by FDA under an Emergency Use Authorization (EUA). This EUA will remain in effect (meaning this test can be used) for the duration of the COVID-19 declaration under Section 564(b)(1) of the Act, 21 U.S.C. section 360bbb-3(b)(1), unless the authorization is terminated or revoked.  Performed at [facility], [place] [address] [place], [place] [address]   MRSA Next Gen by PCR, Nasal     Status: None   Collection Time: 07/24/21  6:56 PM   Specimen: Nasal Mucosa; Nasal Swab  Result Value Ref Range Status   MRSA by PCR Next Gen NOT DETECTED NOT  DETECTED Final     Comment: (NOTE) The GeneXpert MRSA Assay (FDA approved for NASAL specimens only), is one component of a comprehensive MRSA colonization surveillance program. It is not intended to diagnose MRSA infection nor to guide or monitor treatment for MRSA infections. Test performance is not FDA approved in patients less than 30 years old. Performed at [facility], [place] [address], [place], [place] [address]   Respiratory (~20 pathogens) panel by PCR     Status: None   Collection Time: 07/31/21  5:12 PM   Specimen: Nasopharyngeal Swab; Respiratory  Result Value Ref Range Status   Adenovirus NOT DETECTED NOT DETECTED Final   Coronavirus 229E NOT DETECTED NOT DETECTED Final    Comment: (NOTE) The Coronavirus on the Respiratory Panel, DOES NOT test for the novel  Coronavirus (2019 nCoV)    Coronavirus HKU1 NOT DETECTED NOT DETECTED Final   Coronavirus NL63 NOT DETECTED NOT DETECTED Final   Coronavirus OC43 NOT DETECTED NOT DETECTED Final   Metapneumovirus NOT DETECTED NOT DETECTED Final   Rhinovirus / Enterovirus NOT DETECTED NOT DETECTED Final   Influenza A NOT DETECTED NOT DETECTED Final   Influenza B NOT DETECTED NOT DETECTED Final   Parainfluenza Virus 1 NOT DETECTED NOT DETECTED Final   Parainfluenza Virus 2 NOT DETECTED NOT DETECTED Final   Parainfluenza Virus 3 NOT DETECTED NOT DETECTED Final   Parainfluenza Virus 4 NOT DETECTED NOT DETECTED Final   Respiratory Syncytial Virus NOT DETECTED NOT DETECTED Final   Bordetella pertussis NOT DETECTED NOT DETECTED Final   Bordetella Parapertussis NOT DETECTED NOT DETECTED Final   Chlamydophila pneumoniae NOT DETECTED NOT DETECTED Final   Mycoplasma pneumoniae NOT DETECTED NOT DETECTED Final    Comment: Performed at [facility], [place]. [address], [place], [place] [address]          Radiology Studies: DG CHEST PORT 1 VIEW  Result Date: 07/31/2021 CLINICAL DATA:  Shortness of breath. EXAM: PORTABLE CHEST 1 VIEW  COMPARISON:  Chest x-ray 07/24/2021 and chest CT 07/24/2021. FINDINGS: New right upper extremity PICC terminates in the distal SVC. No pneumothorax. Stable right upper lobe/perihilar focal density. Stable small right pleural effusion. Cardiac silhouette within normal limits. No acute fractures. IMPRESSION: 1. New right upper extremity PICC line terminates in the distal SVC. 2. No pneumothorax. 3. Stable small right pleural effusion and right upper lobe/perihilar density. Electronically Signed   By: Ronney Asters M.D.   On: 07/31/2021 17:52        Scheduled Meds:  amLODipine  5 mg Oral Daily   ipratropium-albuterol  3 mL Nebulization QID   metoprolol tartrate  5 mg Intravenous Q6H    Continuous Infusions:  sodium chloride Stopped (07/28/21 0342)     LOS: 8 days    Time spent:40 min   Deondria Puryear, Geraldo Docker, MD [facility]   If 7PM-7AM, please contact night-coverage 08/01/2021, 7:45 AM

## 2021-08-02 ENCOUNTER — Other Ambulatory Visit: Payer: Self-pay

## 2021-08-02 ENCOUNTER — Emergency Department (HOSPITAL_COMMUNITY)
Admission: EM | Admit: 2021-08-02 | Discharge: 2021-08-02 | Disposition: A | Discharge status: Home / Self Care | Payer: Commercial Managed Care - HMO | Attending: Student | Admitting: Student

## 2021-08-02 ENCOUNTER — Encounter (HOSPITAL_COMMUNITY): Payer: Self-pay | Admitting: Emergency Medicine

## 2021-08-02 DIAGNOSIS — Z85038 Personal history of other malignant neoplasm of large intestine: Secondary | ICD-10-CM | POA: Insufficient documentation

## 2021-08-02 DIAGNOSIS — R180 Malignant ascites: Secondary | ICD-10-CM | POA: Diagnosis not present

## 2021-08-02 DIAGNOSIS — T839XXA Unspecified complication of genitourinary prosthetic device, implant and graft, initial encounter: Secondary | ICD-10-CM

## 2021-08-02 DIAGNOSIS — T83098A Other mechanical complication of other indwelling urethral catheter, initial encounter: Secondary | ICD-10-CM | POA: Insufficient documentation

## 2021-08-02 DIAGNOSIS — Z79899 Other long term (current) drug therapy: Secondary | ICD-10-CM | POA: Insufficient documentation

## 2021-08-02 DIAGNOSIS — F1410 Cocaine abuse, uncomplicated: Secondary | ICD-10-CM | POA: Diagnosis not present

## 2021-08-02 DIAGNOSIS — F17210 Nicotine dependence, cigarettes, uncomplicated: Secondary | ICD-10-CM | POA: Insufficient documentation

## 2021-08-02 DIAGNOSIS — R82998 Other abnormal findings in urine: Secondary | ICD-10-CM | POA: Diagnosis not present

## 2021-08-02 DIAGNOSIS — E119 Type 2 diabetes mellitus without complications: Secondary | ICD-10-CM | POA: Insufficient documentation

## 2021-08-02 DIAGNOSIS — Z7984 Long term (current) use of oral hypoglycemic drugs: Secondary | ICD-10-CM | POA: Diagnosis not present

## 2021-08-02 DIAGNOSIS — R0602 Shortness of breath: Secondary | ICD-10-CM | POA: Diagnosis not present

## 2021-08-02 DIAGNOSIS — J9621 Acute and chronic respiratory failure with hypoxia: Secondary | ICD-10-CM | POA: Diagnosis not present

## 2021-08-02 DIAGNOSIS — I10 Essential (primary) hypertension: Secondary | ICD-10-CM | POA: Diagnosis not present

## 2021-08-02 DIAGNOSIS — Z85118 Personal history of other malignant neoplasm of bronchus and lung: Secondary | ICD-10-CM | POA: Insufficient documentation

## 2021-08-02 LAB — TYPE AND SCREEN
ABO/RH(D): O POS
Antibody Screen: NEGATIVE
Unit division: 0
Unit division: 0

## 2021-08-02 LAB — CBC WITH DIFFERENTIAL/PLATELET
Abs Immature Granulocytes: 4.53 10*3/uL — ABNORMAL HIGH (ref 0.00–0.07)
Basophils Absolute: 0 10*3/uL (ref 0.0–0.1)
Basophils Relative: 0 %
Eosinophils Absolute: 0 10*3/uL (ref 0.0–0.5)
Eosinophils Relative: 0 %
HCT: 30.5 % — ABNORMAL LOW (ref 36.0–46.0)
Hemoglobin: 10.4 g/dL — ABNORMAL LOW (ref 12.0–15.0)
Immature Granulocytes: 13 %
Lymphocytes Relative: 14 %
Lymphs Abs: 4.7 10*3/uL — ABNORMAL HIGH (ref 0.7–4.0)
MCH: 27.4 pg (ref 26.0–34.0)
MCHC: 34.1 g/dL (ref 30.0–36.0)
MCV: 80.5 fL (ref 80.0–100.0)
Monocytes Absolute: 3.2 10*3/uL — ABNORMAL HIGH (ref 0.1–1.0)
Monocytes Relative: 10 %
Neutro Abs: 21.2 10*3/uL — ABNORMAL HIGH (ref 1.7–7.7)
Neutrophils Relative %: 63 %
Platelets: 71 10*3/uL — ABNORMAL LOW (ref 150–400)
RBC: 3.79 MIL/uL — ABNORMAL LOW (ref 3.87–5.11)
RDW: 21.7 % — ABNORMAL HIGH (ref 11.5–15.5)
WBC: 33.8 10*3/uL — ABNORMAL HIGH (ref 4.0–10.5)
nRBC: 1.2 % — ABNORMAL HIGH (ref 0.0–0.2)

## 2021-08-02 LAB — COMPREHENSIVE METABOLIC PANEL
ALT: 58 U/L — ABNORMAL HIGH (ref 0–44)
AST: 94 U/L — ABNORMAL HIGH (ref 15–41)
Albumin: 2.2 g/dL — ABNORMAL LOW (ref 3.5–5.0)
Alkaline Phosphatase: 695 U/L — ABNORMAL HIGH (ref 38–126)
Anion gap: 9 (ref 5–15)
BUN: 22 mg/dL (ref 8–23)
CO2: 22 mmol/L (ref 22–32)
Calcium: 8.6 mg/dL — ABNORMAL LOW (ref 8.9–10.3)
Chloride: 111 mmol/L (ref 98–111)
Creatinine, Ser: 0.71 mg/dL (ref 0.44–1.00)
GFR, Estimated: >60 mL/min (ref >60)
Glucose, Bld: 116 mg/dL — ABNORMAL HIGH (ref 70–99)
Potassium: 3.7 mmol/L (ref 3.5–5.1)
Sodium: 142 mmol/L (ref 135–145)
Total Bilirubin: 15.5 mg/dL — ABNORMAL HIGH (ref 0.3–1.2)
Total Protein: 5.2 g/dL — ABNORMAL LOW (ref 6.5–8.1)

## 2021-08-02 LAB — BPAM RBC
Blood Product Expiration Date: 202302172359
Blood Product Expiration Date: 202302172359
ISSUE DATE / TIME: 202301201254
ISSUE DATE / TIME: 202301201646
Unit Type and Rh: 5100
Unit Type and Rh: 5100

## 2021-08-02 LAB — MAGNESIUM: Magnesium: 1.7 mg/dL (ref 1.7–2.4)

## 2021-08-02 LAB — URINE CULTURE: Culture: <10000 — AB

## 2021-08-02 LAB — GLUCOSE, CAPILLARY
Glucose-Capillary: 100 mg/dL — ABNORMAL HIGH (ref 70–99)
Glucose-Capillary: 121 mg/dL — ABNORMAL HIGH (ref 70–99)
Glucose-Capillary: 154 mg/dL — ABNORMAL HIGH (ref 70–99)

## 2021-08-02 LAB — PHOSPHORUS: Phosphorus: 2.8 mg/dL (ref 2.5–4.6)

## 2021-08-02 LAB — PROCALCITONIN: Procalcitonin: 2.95 ng/mL

## 2021-08-02 LAB — AMMONIA: Ammonia: 145 umol/L — ABNORMAL HIGH (ref 9–35)

## 2021-08-02 MED ORDER — AMLODIPINE BESYLATE 10 MG PO TABS: 10.0000 mg | ORAL_TABLET | Freq: Every day | ORAL | 0 refills | Status: AC

## 2021-08-02 MED ORDER — LACTULOSE 10 GM/15ML PO SOLN: 30.0000 g | Freq: Two times a day (BID) | ORAL | 0 refills | Status: AC

## 2021-08-02 MED ORDER — RIFAXIMIN 550 MG PO TABS: 550.0000 mg | ORAL_TABLET | Freq: Two times a day (BID) | ORAL | 0 refills | Status: AC

## 2021-08-02 MED ORDER — IPRATROPIUM-ALBUTEROL 20-100 MCG/ACT IN AERS
1.0000 | INHALATION_SPRAY | Freq: Four times a day (QID) | RESPIRATORY_TRACT | 0 refills | Status: AC | PRN
Start: 2021-08-02 — End: ?

## 2021-08-02 MED ORDER — METOPROLOL TARTRATE 25 MG PO TABS: 25.0000 mg | ORAL_TABLET | Freq: Two times a day (BID) | ORAL | 0 refills | Status: AC

## 2021-08-02 MED ORDER — IPRATROPIUM-ALBUTEROL 0.5-2.5 (3) MG/3ML IN SOLN: 3.0000 mL | Freq: Two times a day (BID) | RESPIRATORY_TRACT | Status: DC

## 2021-08-02 NOTE — Plan of Care (Signed)
°  Problem: Education: Goal: Knowledge of General Education information will improve Description: Including pain rating scale, medication(s)/side effects and non-pharmacologic comfort measures Outcome: Adequate for Discharge   Problem: Health Behavior/Discharge Planning: Goal: Ability to manage health-related needs will improve Outcome: Adequate for Discharge   Problem: Clinical Measurements: Goal: Ability to maintain clinical measurements within normal limits will improve Outcome: Adequate for Discharge Goal: Will remain free from infection Outcome: Adequate for Discharge Goal: Diagnostic test results will improve Outcome: Adequate for Discharge Goal: Respiratory complications will improve Outcome: Adequate for Discharge Goal: Cardiovascular complication will be avoided Outcome: Adequate for Discharge   Problem: Activity: Goal: Risk for activity intolerance will decrease Outcome: Adequate for Discharge   Problem: Nutrition: Goal: Adequate nutrition will be maintained Outcome: Adequate for Discharge   Problem: Coping: Goal: Level of anxiety will decrease Outcome: Adequate for Discharge   Problem: Elimination: Goal: Will not experience complications related to bowel motility Outcome: Adequate for Discharge Goal: Will not experience complications related to urinary retention Outcome: Adequate for Discharge   Problem: Pain Managment: Goal: General experience of comfort will improve Outcome: Adequate for Discharge   Problem: Safety: Goal: Ability to remain free from injury will improve Outcome: Adequate for Discharge   Problem: Skin Integrity: Goal: Risk for impaired skin integrity will decrease Outcome: Adequate for Discharge   Problem: Safety: Goal: Non-violent Restraint(s) Outcome: Adequate for Discharge

## 2021-08-02 NOTE — Discharge Summary (Signed)
Physician Discharge Summary  Meredith Goodman LZJ:[id number] DOB: 08/11/1955 DOA: 07/24/2021  PCP: Pcp, No  Admit date: 07/24/2021 Discharge date: 08/02/2021  Time spent: 30 minutes  Recommendations for Outpatient Follow-up:   Acute on chronic respiratory failure with hypoxia   - Multifactorial lung mass, pneumonia, small cell lung cancer.  Treat underlying causes -Titrate O2 to maintain SPO2> 92% for comfort -1/19 PCXR pending - 1/19 sputum and respiratory virus panel pending - Flutter valve - Incentive spirometry -DuoNeb QID SATURATION QUALIFICATIONS: (This note is used to comply with regulatory documentation for home oxygen) Patient Saturations on Room Air at Rest = 90% Patient Saturations on Room Air while Ambulating = 86% Patient Saturations on 2 Liters of oxygen while Ambulating = 100% Please briefly explain why patient needs home oxygen:pt dsat down to 86 while walking without oxygen. -Patient meets criteria for home O2 - 2 L O2 titrate to maintain SPO2> 92% - Provide Inogen portable O2 concentrator    Metastatic small cell cancer/lung mass/infiltrate -1/18 patient now comfort care -1/19 patient and [name] (daughter) reversed comfort care patient now DNR.  Would like everything that can be treated to be treated. -1/19 per Dr. Truitt Merle oncology note on 1/16, given patient's multiorgan failure and overall poor performance status, she is not a candidate for additional chemotherapy.   Sepsis, suspect intra-abdominal source -1/18 patient now comfort care  -1/19 patient and [name] (daughter) reversed comfort care patient now DNR.  Would like everything that can be treated to be treated.  Labs pending -1/20 DOES NOT meet criteria for sepsis.  Elevated    Agitation delirium/acute Hepatic encephalopathy -1/18 agitation resolved   Elevated Ammonia - Secondary to liver metastasis - 1/20 monitor ammonia level currently= 135 - 1/20 Lactulose 30 g BID  - 1/20 Rifaximin 559m BID    Refractory hypokalemia, likely from GI losses -1/18 patient now comfort care  -1/19 patient and SSunday Spillers(daughter) reversed comfort care patient now DNR.  Would like everything that can be treated to be treated.  Labs pending -1/20 resolved   Hypophosphatemia -1/18 patient now comfort care -1/19 patient and SSunday Spillers(daughter) reversed comfort care patient now DNR.  Would like everything that can be treated to be treated.  Labs pending -12/20 resolved   Mild non-anion gap metabolic acidosis -10/24patient now comfort care -1/19 patient and SSunday Spillers(daughter) reversed comfort care patient now DNR.  Would like everything that can be treated to be treated.  Labs pending   Hypernatremia, acute -1/18 patient now comfort care   -1/19 patient and SSunday Spillers(daughter) reversed comfort care patient now DNR.  Would like everything that can be treated to be treated.  Labs pending -12/20 resolved   Leukocytosis/Neutropenia -1/9 per oncology office visit received dose of Neulasta -1/18 patient now comfort care -1/19 patient and SSunday Spillers(daughter) reversed comfort care patient now DNR.  Would like everything that can be treated to be treated.  Labs pending -9/20 patient with significant leukocytosis, negative fever, normal lactic acid   Elevated liver chemistries in the setting of liver metastasis. -1/18 patient now comfort care  -1/19 patient and SSunday Spillers(daughter) reversed comfort care patient now DNR.  Would like everything that can be treated to be treated.  Labs pending -12/20 patient internal phase of metastatic lung cancer expect that her liver enzymes will continue to remain elevated.  Follow-up with oncology upon discharge   Hyperbilirubinemia -1/18 patient now comfort care  -1/19 patient and SSunday Spillers(daughter) reversed comfort care patient now DNR.  Would  like everything that can be treated to be treated.  Labs pending   AKI -Likely secondary to intravascular volume depletion -1/18 patient now  comfort care  -1/19 patient and [name] (daughter) reversed comfort care patient now DNR.  Would like everything that can be treated to be treated.  Labs pending Lab Results  Component Value Date   CREATININE 0.71 08/02/2021   CREATININE 0.81 08/01/2021   CREATININE 1.05 (H) 07/31/2021   CREATININE 1.06 (H) 07/30/2021   CREATININE 1.05 (H) 07/29/2021  -1/21 resolved  Severe protein calorie malnutrition -1/18 patient now comfort care  -1/19 Speech evaluation: Recommends dysphagia 3: Thin liquid diet   Acute blood loss anemia, rule out intra-abdominal bleed -1/18 patient now comfort care  -1/19 patient and [name] (daughter) reversed comfort care patient now DNR.  Would like everything that can be treated to be treated.  Labs pending -Transfuse for hemoglobin<7.5 per oncology -1/20 transfused 2 units PRBC   Small abdominal ascites  -1/13 US abdomen shows trace amount of ascites -1/18 patient now comfort care   Treated coagulopathy in the setting of advanced liver disease -1/18 patient now comfort care   Uncontrolled HTN/Sinus Tachycardia  -1/18 patient now comfort care. - 1/20 increase Amlodipine 10 mg daily - 1/20 Metoprolol 25 BID   Chronic Thrombocytopenia in the setting of advanced liver disease -1/13 transfuse 1 unit of platelet  -1/18 patient now comfort care -1/19 patient and [name] (daughter) reversed comfort care patient now DNR.  Would like everything that can be treated to be treated.  Labs pending -Per oncology transfuse for platelet count<20,000 or active bleeding   Improving pancytopenia likely secondary to recent chemotherapy -1/18 patient now comfort care -1/19 patient and [name] (daughter) reversed comfort care patient now DNR.  Would like everything that can be treated to be treated.  Labs pending     Chronic pain syndrome -1/18 patient now comfort care -1/19 patient and [name] (daughter) reversed comfort care patient now DNR.  Would like everything that  can be treated to be treated.  Labs pending -1/20 Oxycodone 10 mg QID PRN.  Patient will need to follow-up with oncology or establish care with pain clinic to obtain long-term pain medication.  Especially given her polysubstance abuse (EtOH, cocaine) .  We will need to sign pain contract.   Polysubstance abuse including cocaine and alcohol. -1/18 patient now comfort care -EtOH and cocaine abuse.   Goals of care Palliative care team consulted to assist with establishing goals of care Very poor prognosis in the setting of advanced cancer, decompensated advanced liver disease, liver metastasis, ongoing intermittent alcohol use, drug use. Made DNR on 07/29/2021 -1/19 patient and [name] (daughter) reversed comfort care patient now DNR.  Would like everything that can be treated to be treated.  Labs pending -1/21 contacted by RN Jeannine Boga patient states she is leaving NOW.  Patient stated she will call cab in order to get home if none of her family members able to come and pick her up. -1/21 inform Dr. 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 BF PMHx malignant carcinoid of the ileum, small cell lung cancer with liver mets, post chemotherapy a week prior to admission, essential hypertension, substance abuse   Presented to [facility] ED due to sudden onset shortness of breath of 1 day duration.     Work-up revealed hepatic encephalopathy, acute hypoxic respiratory failure secondary to  right upper lobe pneumonia versus right upper lobe lung mass, decompensated liver failure, coagulopathy.  Seen by medical oncology and GI.  Patient has a very poor prognosis, therefore palliative care was consulted.     UDS positive for opiates and cocaine on 07/24/2021.   Hospital course complicated by agitation delirium for which she was started on Precedex drip and transferred to ICU.  PICC line and NG tube placed on 07/27/2021.   07/29/2021: Seen and examined at her bedside.  She is sedated on Precedex however she arouses to voices.  Denies having any pain.  Her abdomen is distended.  Hospital Course:  See above    Consultations: Palliative care PCCM   Cultures  1/19 respiratory virus panel negative 1/19 blood LEFT AC NGTD 1/19 blood LEFT wrist NGTD    Discharge Exam: Vitals:   08/01/21 2019 08/02/21 0613 08/02/21 0752 08/02/21 0841  BP: (!) 153/85 (!) 151/82  (!) 146/80  Pulse: 94 (!) 103  (!) 107  Resp: 18 18    Temp: 99.3 F (37.4 C) 98.5 F (36.9 C)    TempSrc:  Oral    SpO2: 100% 98% 92% 98%  Weight:      Height:        General: A/O x4 , positive acute respiratory distress, cachectic, complains of back pain Eyes: negative scleral hemorrhage, negative anisocoria, positive icterus ENT: Negative Runny nose, negative gingival bleeding, Neck:  Negative scars, masses, torticollis, lymphadenopathy, JVD Lungs: decreased breath sounds bilaterally without wheezes or crackles  Discharge Instructions   Allergies as of 08/02/2021       Reactions   Benadryl [diphenhydramine] Shortness Of Breath, Other (See Comments)   Large dose causes shortness of breath        Medication List     STOP taking these medications    atenolol 50 MG tablet Commonly known as: TENORMIN   colestipol 1 g tablet Commonly known as: Colestid   morphine 15 MG 12 hr tablet Commonly known as: MS CONTIN   potassium chloride 10 MEQ CR capsule Commonly known as: MICRO-K   potassium  chloride SA 20 MEQ tablet Commonly known as: KLOR-CON M   tiZANidine 2 MG tablet Commonly known as: ZANAFLEX       TAKE these medications    amLODipine 10 MG tablet Commonly known as: NORVASC Take 1 tablet (10 mg total) by mouth daily. What changed:  medication strength See the new instructions.   freestyle lancets USE AS DIRECTED TO CHECK BLOOD GLUCOSE LEVELS 4 TIMES DAILY.   FREESTYLE LITE test strip Generic drug: glucose blood USE AS DIRECTED TO CHECK BLOOD GLUCOSE LEVELS 4 TIMES DAILY.   FreeStyle Lite w/Device Kit Use up to 4 times daily as directed   lactulose 10 GM/15ML solution Commonly known as: CHRONULAC Take 45 mLs (30 g total) by mouth 2 (two) times daily.   metFORMIN 500 MG tablet Commonly known as: GLUCOPHAGE Take 500 mg by mouth at bedtime.   metoprolol tartrate 25 MG tablet Commonly known as: LOPRESSOR Take 1 tablet (25 mg  total) by mouth 2 (two) times daily.   ondansetron 4 MG tablet Commonly known as: ZOFRAN Take 1 tablet (4 mg total) by mouth every 8 (eight) hours as needed for nausea or vomiting.   Oxycodone HCl 10 MG Tabs Take 1 tablet (10 mg total) by mouth every 6 (six) hours as needed. What changed: reasons to take this   prochlorperazine 10 MG tablet Commonly known as: COMPAZINE Take 1 tablet (10 mg total) by mouth every 6 (six) hours as needed for nausea or vomiting.   rifaximin 550 MG Tabs tablet Commonly known as: XIFAXAN Take 1 tablet (550 mg total) by mouth 2 (two) times daily.               Durable Medical Equipment  (From admission, onward)           Start     Ordered   08/02/21 0904  For home use only DME oxygen  Once       Comments: SATURATION QUALIFICATIONS: (This note is used to comply with regulatory documentation for home oxygen) Patient Saturations on Room Air at Rest = 90% Patient Saturations on Room Air while Ambulating = 86% Patient Saturations on 2 Liters of oxygen while Ambulating = 100% Please  briefly explain why patient needs home oxygen:pt dsat down to 86 while walking without oxygen. -Patient meets criteria for home O2 - 2 L O2 titrate to maintain SPO2> 92% - Provide Inogen portable O2 concentrator  Question Answer Comment  Length of Need Lifetime   Mode or (Route) Nasal cannula   Liters per Minute 2   Frequency Continuous (stationary and portable oxygen unit needed)   Oxygen conserving device Yes   Oxygen delivery system Gas      08/02/21 0904           Allergies  Allergen Reactions   Benadryl [Diphenhydramine] Shortness Of Breath and Other (See Comments)    Large dose causes shortness of breath      The results of significant diagnostics from this hospitalization (including imaging, microbiology, ancillary and laboratory) are listed below for reference.    Significant Diagnostic Studies: DG Abd 1 View  Result Date: 07/29/2021 CLINICAL DATA:  NG tube placement. EXAM: ABDOMEN - 1 VIEW COMPARISON:  07/26/2021 FINDINGS: The tip of an endotracheal tube projects near the GE junction with side hole overlying the distal esophagus. The tube is more proximally located than on the prior study. A right PICC terminates near the superior cavoatrial junction/high right atrium. There is mild gaseous distension of the splenic flexure of the colon. There is persistent right suprahilar consolidation, more fully evaluated on a recent chest CTA. IMPRESSION: Endotracheal tube terminating near the GE junction. Advancement by 10 cm would place both the tip and side hole well within the stomach. Electronically Signed   By: Logan Bores M.D.   On: 07/29/2021 17:42   DG Abd 1 View  Result Date: 07/26/2021 CLINICAL DATA:  Status post nasogastric tube placement. EXAM: ABDOMEN - 1 VIEW COMPARISON:  July 26, 2021 (5:04 p.m.) FINDINGS: The feeding tube seen on the prior study has been removed and has been replaced with a nasogastric tube. Its distal tip is seen overlying the expected region of  the body of the stomach. The distal side hole sits along the gastroesophageal junction. The bowel gas pattern is normal. No radio-opaque calculi or other significant radiographic abnormality are seen. IMPRESSION: Nasogastric tube positioning, as described above. Electronically Signed   By: Joyce Gross.D.  On: 07/26/2021 23:12   DG Abd 1 View  Result Date: 07/26/2021 CLINICAL DATA:  Feeding tube placement. EXAM: ABDOMEN - 1 VIEW COMPARISON:  CT, 07/01/2021. FINDINGS: Enteric feeding tube has its metallic tip in the proximal stomach, just below the GE junction. Normal bowel gas pattern. IMPRESSION: 1. Enteric feeding tube tip lies in the proximal stomach just below the GE junction. This will need to be further inserted to allow the tip to enter the duodenum by peristalsis. Recommend advancing 20 cm. Electronically Signed   By: Lajean Manes M.D.   On: 07/26/2021 17:40   CT HEAD WO CONTRAST (5MM)  Result Date: 07/27/2021 CLINICAL DATA:  Altered mental status. EXAM: CT HEAD WITHOUT CONTRAST TECHNIQUE: Contiguous axial images were obtained from the base of the skull through the vertex without intravenous contrast. RADIATION DOSE REDUCTION: This exam was performed according to the departmental dose-optimization program which includes automated exposure control, adjustment of the mA and/or kV according to patient size and/or use of iterative reconstruction technique. COMPARISON:  September 10, 2006 FINDINGS: Brain: Motion degraded exam despite repeated attempts. No evidence of acute infarction, hemorrhage, hydrocephalus, extra-axial collection or mass lesion/mass effect. Vascular: No hyperdense vessel or unexpected calcification. Skull: Normal. Negative for fracture or focal lesion. Sinuses/Orbits: No acute finding. Other: None. IMPRESSION: 1. Motion degraded exam despite repeated attempts. 2. No acute intracranial abnormalities. Electronically Signed   By: Fidela Salisbury M.D.   On: 07/27/2021 13:55    CT Angio Chest PE W and/or Wo Contrast  Result Date: 07/24/2021 CLINICAL DATA:  Shortness of breath, receiving chemotherapy for liver carcinoma. EXAM: CT ANGIOGRAPHY CHEST WITH CONTRAST TECHNIQUE: Multidetector CT imaging of the chest was performed using the standard protocol during bolus administration of intravenous contrast. Multiplanar CT image reconstructions and MIPs were obtained to evaluate the vascular anatomy. RADIATION DOSE REDUCTION: This exam was performed according to the departmental dose-optimization program which includes automated exposure control, adjustment of the mA and/or kV according to patient size and/or use of iterative reconstruction technique. CONTRAST:  67m OMNIPAQUE IOHEXOL 350 MG/ML SOLN COMPARISON:  None. FINDINGS: Cardiovascular: Satisfactory opacification of the pulmonary arteries to the segmental level. No evidence of pulmonary embolism. Normal heart size. Coronary artery atherosclerotic calcifications. No pericardial effusion. Mediastinum/Nodes: Right hilar region masslike opacity which may represent lymph nodes or mass. Lungs/Pleura: Emphysematous changes of bilateral lung apices. Large right upper lobe consolidation which extends into the right hilar region and is difficult to differentiate between the lung consolidation/right hilar mass/lymph nodes. There are ground-glass opacities and fibrotic changes in the right upper lobe. Small right pleural effusion. Right lower lobe atelectasis with adjacent ground-glass opacities concerning for infectious/inflammatory process. Upper Abdomen: No acute abnormality. Enlarged liver. Evaluation of hepatic parenchyma is limited on this pulmonary angiogram. Musculoskeletal: Heterogeneous marrow density of the spine. No acute osseous abnormality. Review of the MIP images confirms the above findings. IMPRESSION: 1.  No evidence of pulmonary embolism. 2. Large right upper lobe consolidation which extends into the right hilar region. It may  represent acute pneumonia with hilar lymphadenopathy, however a lung mass with metastatic lymph nodes can not be excluded. Short-term follow-up examination is recommended. 3. Small right pleural effusion with right basilar atelectasis with adjacent ground-glass opacities concerning for infectious/inflammatory process. 4.  Emphysematous changes of bilateral lungs. 5. Heterogeneous bone density of the thoracic spine without evidence of acute osseous abnormality. Electronically Signed   By: IKeane PoliceD.O.   On: 07/24/2021 16:23   UKoreaAbdomen Complete  Result Date:  07/25/2021 CLINICAL DATA:  Transaminitis. 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 EXAM: ABDOMEN ULTRASOUND COMPLETE COMPARISON:  None. FINDINGS: Gallbladder: There is diffuse heterogeneity of the gallbladder lumen with hyperechoic and anechoic regions and partial shadowing. This may be secondary to diffuse gallstones. The gallbladder wall thickness measures 3 mm, at the upper limits of normal. Common bile duct: Diameter: 4 mm, within normal limits Liver: Mildly nodular liver contour. Numerous hypoechoic nodular densities are seen throughout the liver consistent with previously reported metastatic disease. Within the right hepatic lobe there is a peripheral subcapsular anechoic avascular 2.1 x 1.2 x 2.0 cm cyst. At the periphery of this cyst is a tiny echogenic nodule measuring up to 6 mm that may represent one of the innumerable parenchymal nodules seen throughout the liver. Portal vein is patent on color Doppler imaging with normal direction of blood flow towards the liver. IVC: No abnormality visualized. Pancreas: Visualized portion unremarkable. Spleen: Size and appearance within normal limits. Right Kidney: Length: 9.7 cm. Echogenicity within normal limits. No mass or hydronephrosis visualized. Left Kidney: Length: 9.3 cm. Echogenicity within normal limits. No mass or hydronephrosis visualized. Abdominal aorta: No  aneurysm visualized. The aortic bifurcation was not visualized. Other findings: Small left pleural effusion. Mild ascites around the liver and spleen. IMPRESSION:: IMPRESSION: 1. There are numerable nodules seen throughout the liver consistent with the diffuse metastases seen on prior CT and PET-CT. Within the posterior aspect of the right hepatic lobe there is a lobular cystic lesion that is similar to 07/01/2021 prior CT. 2. There is diffuse heterogeneity of the gallbladder lumen. No gallbladder wall thickening. This may represent numerous gallstones. Gallbladder malignancy is felt less likely. 3. Mild ascites.  Small left pleural effusion. Electronically Signed   By: Yvonne Kendall M.D.   On: 07/25/2021 09:16   DG CHEST PORT 1 VIEW  Result Date: 07/31/2021 CLINICAL DATA:  Shortness of breath. EXAM: PORTABLE CHEST 1 VIEW COMPARISON:  Chest x-ray 07/24/2021 and chest CT 07/24/2021. FINDINGS: New right upper extremity PICC terminates in the distal SVC. No pneumothorax. Stable right upper lobe/perihilar focal density. Stable small right pleural effusion. Cardiac silhouette within normal limits. No acute fractures. IMPRESSION: 1. New right upper extremity PICC line terminates in the distal SVC. 2. No pneumothorax. 3. Stable small right pleural effusion and right upper lobe/perihilar density. Electronically Signed   By: Ronney Asters M.D.   On: 07/31/2021 17:52   DG Chest Port 1 View  Result Date: 07/24/2021 CLINICAL DATA:  History of metastatic neuroendocrine tumor EXAM: PORTABLE CHEST 1 VIEW COMPARISON:  PET-CT 02/28/2021 FINDINGS: Heart size is normal. Abnormal soft tissue prominence of the right hilum and right paratracheal stripe compatible with known metastatic disease. New airspace consolidation within the inferior aspect of the right upper lobe extending to the hilum which may represent atelectasis or infiltrate. Trace right pleural effusion. No pneumothorax. IMPRESSION: New airspace consolidation within  the inferior aspect of the right upper lobe extending to the hilum which may represent atelectasis or infiltrate. Electronically Signed   By: Davina Poke D.O.   On: 07/24/2021 14:54   ECHOCARDIOGRAM COMPLETE  Result Date: 07/27/2021    ECHOCARDIOGRAM REPORT   Patient Name:   ARRIYAH MADEJ Date of Exam: 07/27/2021 Medical Rec #:  [id number]    Height:       62.0 in Accession #:    [id number]   Weight:       107.1 lb Date of Birth:  1955-10-17   BSA:  1.466 m Patient Age:    61 years     BP:           98/62 mmHg Patient Gender: F            HR:           85 bpm. Exam Location:  Inpatient Procedure: 2D Echo, Cardiac Doppler and Color Doppler Indications:    Respiratory distress  History:        Patient has no prior history of Echocardiogram examinations.                 Risk Factors:Hypertension.  Sonographer:    Jyl Heinz Referring Phys: [place]  1. Left ventricular ejection fraction, by estimation, is 60 to 65%. The left ventricle has normal function. The left ventricle has no regional wall motion abnormalities. Left ventricular diastolic parameters are consistent with Grade I diastolic dysfunction (impaired relaxation).  2. Right ventricular systolic function is normal. The right ventricular size is normal. Tricuspid regurgitation signal is inadequate for assessing PA pressure.  3. The mitral valve is normal in structure. Trivial mitral valve regurgitation.  4. The aortic valve is tricuspid. There is mild calcification of the aortic valve. There is mild thickening of the aortic valve. Aortic valve regurgitation is not visualized.  5. The inferior vena cava is normal in size with <50% respiratory variability, suggesting right atrial pressure of 8 mmHg. Comparison(s): No prior Echocardiogram. FINDINGS  Left Ventricle: Left ventricular ejection fraction, by estimation, is 60 to 65%. The left ventricle has normal function. The left ventricle has no regional wall motion  abnormalities. The left ventricular internal cavity size was normal in size. There is  no left ventricular hypertrophy. Left ventricular diastolic parameters are consistent with Grade I diastolic dysfunction (impaired relaxation). Right Ventricle: The right ventricular size is normal. No increase in right ventricular wall thickness. Right ventricular systolic function is normal. Tricuspid regurgitation signal is inadequate for assessing PA pressure. Left Atrium: Left atrial size was normal in size. Right Atrium: Right atrial size was normal in size. Pericardium: Ascites is present. There is no evidence of pericardial effusion. Mitral Valve: The mitral valve is normal in structure. There is mild thickening of the mitral valve leaflet(s). There is mild calcification of the mitral valve leaflet(s). Mild mitral annular calcification. Trivial mitral valve regurgitation. Tricuspid Valve: The tricuspid valve is normal in structure. Tricuspid valve regurgitation is trivial. Aortic Valve: The aortic valve is tricuspid. There is mild calcification of the aortic valve. There is mild thickening of the aortic valve. Aortic valve regurgitation is not visualized. Aortic valve peak gradient measures 6.1 mmHg. Pulmonic Valve: The pulmonic valve was normal in structure. Pulmonic valve regurgitation is trivial. Aorta: The aortic root is normal in size and structure. Venous: The inferior vena cava is normal in size with less than 50% respiratory variability, suggesting right atrial pressure of 8 mmHg. IAS/Shunts: The atrial septum is grossly normal.  LEFT VENTRICLE PLAX 2D LVIDd:         3.60 cm     Diastology LVIDs:         2.40 cm     LV e' medial:    6.31 cm/s LV PW:         0.80 cm     LV E/e' medial:  13.9 LV IVS:        1.00 cm     LV e' lateral:   8.16 cm/s LVOT diam:     2.00  cm     LV E/e' lateral: 10.7 LV SV:         57 LV SV Index:   39 LVOT Area:     3.14 cm  LV Volumes (MOD) LV vol d, MOD A2C: 55.8 ml LV vol d, MOD A4C:  57.5 ml LV vol s, MOD A2C: 20.8 ml LV vol s, MOD A4C: 22.2 ml LV SV MOD A2C:     35.0 ml LV SV MOD A4C:     57.5 ml LV SV MOD BP:      35.8 ml RIGHT VENTRICLE             IVC RV Basal diam:  2.50 cm     IVC diam: 1.40 cm RV Mid diam:    2.10 cm RV S prime:     12.60 cm/s TAPSE (M-mode): 1.9 cm LEFT ATRIUM           Index        RIGHT ATRIUM          Index LA diam:      2.90 cm 1.98 cm/m   RA Area:     7.85 cm LA Vol (A2C): 20.3 ml 13.85 ml/m  RA Volume:   14.10 ml 9.62 ml/m LA Vol (A4C): 22.5 ml 15.35 ml/m  AORTIC VALVE AV Area (Vmax): 2.51 cm AV Vmax:        123.00 cm/s AV Peak Grad:   6.1 mmHg LVOT Vmax:      98.30 cm/s LVOT Vmean:     66.400 cm/s LVOT VTI:       0.182 m  AORTA Ao Root diam: 2.90 cm MITRAL VALVE MV Area (PHT): 4.80 cm     SHUNTS MV Decel Time: 158 msec     Systemic VTI:  0.18 m MV E velocity: 87.50 cm/s   Systemic Diam: 2.00 cm MV A velocity: 107.00 cm/s MV E/A ratio:  0.82 Gwyndolyn Kaufman MD Electronically signed by Gwyndolyn Kaufman MD Signature Date/Time: 07/27/2021/2:40:17 PM    Final    [place] ASCITES (ABDOMEN LIMITED)  Result Date: 07/25/2021 CLINICAL DATA:  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 COMPARISON:  07/01/2021 FINDINGS: Survey of the abdominal 4 quadrants demonstrates a very small amount of abdominopelvic ascites. There is not enough fluid to warrant therapeutic paracentesis. Procedure not performed. IMPRESSION: Trace abdominopelvic ascites. Electronically Signed   By: Jerilynn Mages.  Shick M.D.   On: 07/25/2021 15:03   [place] CORE BIOPSY (LIVER)  Result Date: 07/11/2021 Criselda Peaches, MD     07/11/2021  2:52 PM Interventional Radiology Procedure Note Procedure: US guided liver bx Complications: None Estimated Blood Loss: None Recommendations: - Bedrest x 2 hrs - DC home Signed, Criselda Peaches, MD   [place] EKG  SITE RITE  Result Date: 07/27/2021 If Site Rite image not attached, placement could not be confirmed due to current cardiac rhythm.   Microbiology: Recent Results (from the past 240 hour(s))  Urine Culture     Status: Abnormal   Collection Time: 07/24/21  1:49 PM   Specimen: In/Out Cath Urine  Result Value Ref Range Status   Specimen Description   Final    IN/OUT CATH URINE Performed at [facility], [place] [address] [place], [place] [address]    Special Requests   Final    Immunocompromised Performed at [facility], [address] [place], [place] [address]    Culture MULTIPLE SPECIES PRESENT, SUGGEST RECOLLECTION (A)  Final   Report Status 07/26/2021 FINAL  Final  Blood Culture (routine x 2)     Status: None   Collection Time: 07/24/21  2:20 PM   Specimen: BLOOD  Result Value Ref Range Status   Specimen Description   Final    BLOOD BLOOD LEFT FOREARM Performed at [facility] [address] [place], [place] [address]    Special Requests   Final    BOTTLES DRAWN AEROBIC AND ANAEROBIC Blood Culture results may not be optimal due to an inadequate volume of blood received in culture bottles Performed at [facility] [address] [place], [place] [address]    Culture   Final    NO GROWTH 5 DAYS Performed at [facility], [place] [address], [place], [place] [address]    Report Status 07/29/2021 FINAL  Final  Blood Culture (routine x 2)     Status: None   Collection Time: 07/24/21  2:25 PM   Specimen: BLOOD  Result Value Ref Range Status   Specimen Description   Final    BLOOD BLOOD RIGHT FOREARM Performed at [facility] [address], [place], [place] [address]    Special Requests   Final    BOTTLES DRAWN AEROBIC AND ANAEROBIC Blood Culture results may not be optimal due to an inadequate volume of blood received in culture bottles Performed at [facility] [address], [place], [place] [address]    Culture   Final    NO GROWTH 5 DAYS Performed at [facility], [place] [address], [place], [place] [address]    Report Status 07/29/2021 FINAL  Final  Resp Panel by RT-PCR (Flu A&B, Covid) Nasopharyngeal Swab     Status: None   Collection Time: 07/24/21  2:36 PM   Specimen: Nasopharyngeal Swab; Nasopharyngeal(NP) swabs in vial transport medium  Result Value Ref Range Status   SARS Coronavirus 2 by RT PCR NEGATIVE NEGATIVE Final    Comment: (NOTE) SARS-CoV-2 target nucleic acids are NOT DETECTED.  The SARS-CoV-2 RNA is generally detectable in upper respiratory specimens during the acute phase of infection. The lowest concentration of SARS-CoV-2 viral copies this assay can detect is 138 copies/mL. A negative result does not preclude SARS-Cov-2 infection and should not be used as the sole basis for treatment or other patient management decisions. A negative result may occur with  improper specimen collection/handling, submission of specimen other than nasopharyngeal swab, presence of viral mutation(s) within the areas targeted by this assay, and inadequate number of viral copies(<138 copies/mL). A negative result must be combined with clinical observations, patient history, and epidemiological information. The expected result is Negative.  Fact Sheet for Patients:  EntrepreneurPulse.com.au  Fact Sheet for Healthcare Providers:  IncredibleEmployment.be  This test is no t yet approved or cleared by the [place] FDA and  has been authorized for detection and/or diagnosis of SARS-CoV-2 by FDA under an Emergency Use Authorization (EUA). This EUA will remain  in effect (meaning this test can be used) for the duration of the COVID-19 declaration under Section 564(b)(1) of the Act, 21 U.S.C.section 360bbb-3(b)(1), unless the authorization is terminated  or revoked sooner.        Influenza A by PCR NEGATIVE NEGATIVE Final   Influenza B by PCR NEGATIVE NEGATIVE Final    Comment: (NOTE) The Xpert Xpress SARS-CoV-2/FLU/RSV plus  assay is intended as an aid in the diagnosis of influenza from Nasopharyngeal swab specimens and should not be used as a sole basis for treatment. Nasal washings and aspirates are unacceptable for Xpert Xpress SARS-CoV-2/FLU/RSV testing.  Fact Sheet for Patients: EntrepreneurPulse.com.au  Fact Sheet for Healthcare Providers: IncredibleEmployment.be  This test is not yet approved or cleared by the [place] FDA and has been authorized for detection and/or diagnosis of SARS-CoV-2 by FDA under an Emergency Use Authorization (EUA). This EUA will remain in effect (meaning this test can be used) for the duration of the COVID-19 declaration under Section 564(b)(1) of the Act, 21 U.S.C. section 360bbb-3(b)(1), unless the authorization is terminated or revoked.  Performed at [facility], [place] [address], [place], [place] [address]   MRSA Next Gen by PCR, Nasal     Status: None   Collection Time: 07/24/21  6:56 PM   Specimen: Nasal Mucosa; Nasal Swab  Result Value Ref Range Status   MRSA by PCR Next Gen NOT DETECTED NOT DETECTED Final    Comment: (NOTE) The GeneXpert MRSA Assay (FDA approved for NASAL specimens only), is one component of a comprehensive MRSA colonization surveillance program. It is not intended to diagnose MRSA infection nor to guide or monitor treatment for MRSA infections. Test performance is not FDA approved in patients less than 40 years old. Performed at [facility], [place] [address], [place], [place] [address]   Respiratory (~20 pathogens) panel by PCR     Status: None   Collection Time: 07/31/21  5:12 PM   Specimen: Nasopharyngeal Swab; Respiratory  Result Value Ref Range Status   Adenovirus NOT DETECTED NOT DETECTED Final   Coronavirus  229E NOT DETECTED NOT DETECTED Final    Comment: (NOTE) The Coronavirus on the Respiratory Panel, DOES NOT test for the novel  Coronavirus (2019 nCoV)    Coronavirus HKU1 NOT DETECTED NOT DETECTED Final   Coronavirus NL63 NOT DETECTED NOT DETECTED Final   Coronavirus OC43 NOT DETECTED NOT DETECTED Final   Metapneumovirus NOT DETECTED NOT DETECTED Final   Rhinovirus / Enterovirus NOT DETECTED NOT DETECTED Final   Influenza A NOT DETECTED NOT DETECTED Final   Influenza B NOT DETECTED NOT DETECTED Final   Parainfluenza Virus 1 NOT DETECTED NOT DETECTED Final   Parainfluenza Virus 2 NOT DETECTED NOT DETECTED Final   Parainfluenza Virus 3 NOT DETECTED NOT DETECTED Final   Parainfluenza Virus 4 NOT DETECTED NOT DETECTED Final   Respiratory Syncytial Virus NOT DETECTED NOT DETECTED Final   Bordetella pertussis NOT DETECTED NOT DETECTED Final   Bordetella Parapertussis NOT DETECTED NOT DETECTED Final   Chlamydophila pneumoniae NOT DETECTED NOT DETECTED Final   Mycoplasma pneumoniae NOT DETECTED NOT DETECTED Final    Comment: Performed at [facility], [place]. [address] [place], [place] [address]  Culture, blood (routine x 2)     Status: None (Preliminary result)   Collection Time: 07/31/21  5:35 PM   Specimen: BLOOD  Result Value Ref Range Status   Specimen Description   Final    BLOOD BLOOD LEFT WRIST Performed at [facility] [address], [place], [place] [address]    Special Requests   Final    BOTTLES DRAWN AEROBIC ONLY Blood Culture adequate volume Performed at [facility] [address] [place], [place] [address]    Culture   Final    NO GROWTH 2 DAYS Performed at [facility]  [address] [place], [place] [address]    Report Status PENDING  Incomplete  Culture, blood (routine x 2)     Status: None (Preliminary result)   Collection Time: 07/31/21  5:36 PM   Specimen: Left Antecubital; Blood  Result Value Ref Range  Status   Specimen Description   Final    LEFT ANTECUBITAL Performed at [facility] [address] [place], [place] [address]    Special Requests   Final    BOTTLES DRAWN AEROBIC ONLY Blood Culture adequate volume Performed at [facility] [address] [place], [place] [address]    Culture   Final    NO GROWTH 2 DAYS Performed at [facility] [address] [place], [place] [address]    Report Status PENDING  Incomplete     Labs: Basic Metabolic Panel: Recent Labs  Lab 07/29/21 0609 07/30/21 0428 07/31/21 1735 08/01/21 0442 08/02/21 0330  NA 146* 145 143 141 142  K 3.2* 4.9 4.2 3.9 3.7  CL 118* 118* 114* 111 111  CO2 21* 20* 20* 22 22  GLUCOSE 171* 124* 130* 91 116*  BUN 36* 35* 28* 26* 22  CREATININE 1.05* 1.06* 1.05* 0.81 0.71  CALCIUM 8.2* 8.4* 8.4* 8.4* 8.6*  MG 2.1 3.2* 1.8 2.0 1.7  PHOS 2.0* 5.3* 2.9 2.8 2.8   Liver Function Tests: Recent Labs  Lab 07/29/21 0609 07/30/21 0428 07/31/21 1735 08/01/21 0442 08/02/21 0330  AST 123* 101* 92* 91* 94*  ALT 69* 68* 61* 59* 58*  ALKPHOS 542* 538* 595* 652* 695*  BILITOT 10.5* 10.7* 13.4* 14.3* 15.5*  PROT 5.0* 5.0* 5.3* 5.2* 5.2*  ALBUMIN 2.1* 2.2* 2.2* 2.2* 2.2*   No results for input(s): LIPASE, AMYLASE in the last 168 hours. Recent Labs  Lab 07/28/21 0327 07/30/21 0427 08/01/21 0537 08/02/21 0330  AMMONIA 97* 92* 135* 145*   CBC: Recent Labs  Lab 07/29/21 0609 07/30/21 0428 07/31/21 1735 08/01/21 0442 08/02/21 0330  WBC 5.7 15.6* 31.9* 33.9* 33.8*  NEUTROABS 2.7 9.0* 19.8* 24.1* 21.2*  HGB 8.1* 8.1* 9.2* 7.0* 10.4*  HCT 23.6* 23.6* 26.4* 20.6* 30.5*  MCV 87.7 87.1 84.9 84.1 80.5  PLT 61* 70* 68* 81* 71*   Cardiac Enzymes: Recent Labs  Lab 07/27/21 0251  CKTOTAL 80  CKMB 2.1   BNP: BNP (last 3 results) No results for input(s): BNP in the last 8760 hours.  ProBNP (last 3 results) No results for input(s): PROBNP in the last 8760  hours.  CBG: Recent Labs  Lab 08/01/21 1502 08/01/21 1943 08/02/21 0000 08/02/21 0416 08/02/21 0720  GLUCAP 120* 163* 121* 100* 154*       Signed:  Dia Crawford, MD [facility]

## 2021-08-02 NOTE — TOC Transition Note (Signed)
Transition of Care [facility]) - CM/SW Discharge Note   Patient Details  Name: Meredith Goodman MRN: [id number] Date of Birth: [birth year]  [facility] CM/SW Contact:  Leeroy Cha, RN Phone Number: 08/02/2021, 9:40 AM   Clinical Narrative:     Patient transtioned to home with home hospice  Final next level of care: Home w Hospice Care Barriers to Discharge: Barriers Resolved   Patient Goals and CMS Choice Patient states their goals for this hospitalization and ongoing recovery are:: ready to go      Discharge Placement                       Discharge Plan and Services   Discharge Planning Services: CM Consult                                 Social Determinants of Health (SDOH) Interventions     Readmission Risk Interventions No flowsheet data found.

## 2021-08-02 NOTE — Progress Notes (Signed)
Patient walked with front wheel walker and one assist down the hallway 12ft tolerated well.

## 2021-08-02 NOTE — TOC Progression Note (Signed)
Transition of Care [facility]) - Progression Note    Patient Details  Name: Meredith Goodman MRN: [id number] Date of Birth: 1956-01-21  [facility] CM/SW Contact  Leeroy Cha, RN Phone Number: 08/02/2021, 8:33 AM  Clinical Narrative:    Message sent to cherry kennedy with [facility] that patient is ready to go home today. Hospice to [facility] on equip to be delivered.   Expected Discharge Plan: Home w Hospice Care Barriers to Discharge: Continued Medical Work up  Expected Discharge Plan and Services Expected Discharge Plan: [place]   Discharge Planning Services: CM Consult   Living arrangements for the past 2 months: Apartment                                       Social Determinants of Health (SDOH) Interventions    Readmission Risk Interventions No flowsheet data found.

## 2021-08-02 NOTE — Progress Notes (Signed)
° °  We received this referral late yesterday afternoon around 430pm. This referral was worked up and has been reviewed with our MD. She is eligible for hospice services at home and her insurance is within our network to assist with comfort care at home. However, I have been unsuccessful in contacting the daughter to discuss and confirm her interest. It appears she will need equipment to be ordered and delivered before going home. I will continue to try and contact with family and once we are have equipment in place we will contact CM to let them. Know we are all prepared.   Please reach out with concerns or questions. Webb Silversmith RN [phone]

## 2021-08-02 NOTE — Progress Notes (Signed)
SATURATION QUALIFICATIONS: (This note is used to comply with regulatory documentation for home oxygen)  Patient Saturations on Room Air at Rest = 90%  Patient Saturations on Room Air while Ambulating = 86%  Patient Saturations on 2 Liters of oxygen while Ambulating = 100%  Please briefly explain why patient needs home oxygen:pt dsat down to 86 while walking without oxygen.

## 2021-08-02 NOTE — Final Progress Note (Signed)
Patient was discharged home with hospice. Hospice contact number was given to daughter and patient. Discharge paperwork was given to patient and family and all questions were answered. Patient was taken to main entrance via wheelchair .

## 2021-08-02 NOTE — ED Triage Notes (Signed)
Patient presents from home via EMS. Patient discharged today from rehab with a foley catheter. Patient states she thinks the catheter became displaced during the day and she began experiencing pain, which increased throughout the day. Patient states that she has not noticed any bleeding. Patient hx of liver cancer with chemo treatments.   Catheter balloon deflated, patient endorses relief of pain.
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
## 2021-08-02 NOTE — Plan of Care (Signed)
°  Problem: Education: Goal: Knowledge of General Education information will improve Description: Including pain rating scale, medication(s)/side effects and non-pharmacologic comfort measures 08/02/2021 0957 by Charlyne Petrin, RN Outcome: Adequate for Discharge 08/02/2021 0957 by Charlyne Petrin, RN Outcome: Adequate for Discharge   Problem: Health Behavior/Discharge Planning: Goal: Ability to manage health-related needs will improve 08/02/2021 0957 by Charlyne Petrin, RN Outcome: Adequate for Discharge 08/02/2021 0957 by Charlyne Petrin, RN Outcome: Adequate for Discharge   Problem: Clinical Measurements: Goal: Ability to maintain clinical measurements within normal limits will improve 08/02/2021 0957 by Charlyne Petrin, RN Outcome: Adequate for Discharge 08/02/2021 0957 by Charlyne Petrin, RN Outcome: Adequate for Discharge Goal: Will remain free from infection 08/02/2021 0957 by Charlyne Petrin, RN Outcome: Adequate for Discharge 08/02/2021 0957 by Charlyne Petrin, RN Outcome: Adequate for Discharge Goal: Diagnostic test results will improve 08/02/2021 0957 by Charlyne Petrin, RN Outcome: Adequate for Discharge 08/02/2021 0957 by Charlyne Petrin, RN Outcome: Adequate for Discharge Goal: Respiratory complications will improve 08/02/2021 0957 by Charlyne Petrin, RN Outcome: Adequate for Discharge 08/02/2021 0957 by Charlyne Petrin, RN Outcome: Adequate for Discharge Goal: Cardiovascular complication will be avoided 08/02/2021 0957 by Charlyne Petrin, RN Outcome: Adequate for Discharge 08/02/2021 0957 by Charlyne Petrin, RN Outcome: Adequate for Discharge   Problem: Activity: Goal: Risk for activity intolerance will decrease 08/02/2021 0957 by Charlyne Petrin, RN Outcome: Adequate for Discharge 08/02/2021 0957 by Charlyne Petrin, RN Outcome: Adequate for Discharge   Problem: Nutrition: Goal: Adequate nutrition will be maintained 08/02/2021 0957 by Charlyne Petrin, RN Outcome: Adequate for Discharge 08/02/2021 0957 by Charlyne Petrin,  RN Outcome: Adequate for Discharge   Problem: Coping: Goal: Level of anxiety will decrease 08/02/2021 0957 by Charlyne Petrin, RN Outcome: Adequate for Discharge 08/02/2021 0957 by Charlyne Petrin, RN Outcome: Adequate for Discharge   Problem: Elimination: Goal: Will not experience complications related to bowel motility 08/02/2021 0957 by Charlyne Petrin, RN Outcome: Adequate for Discharge 08/02/2021 0957 by Charlyne Petrin, RN Outcome: Adequate for Discharge Goal: Will not experience complications related to urinary retention 08/02/2021 0957 by Charlyne Petrin, RN Outcome: Adequate for Discharge 08/02/2021 0957 by Charlyne Petrin, RN Outcome: Adequate for Discharge   Problem: Pain Managment: Goal: General experience of comfort will improve 08/02/2021 0957 by Charlyne Petrin, RN Outcome: Adequate for Discharge 08/02/2021 0957 by Charlyne Petrin, RN Outcome: Adequate for Discharge   Problem: Safety: Goal: Ability to remain free from injury will improve 08/02/2021 0957 by Charlyne Petrin, RN Outcome: Adequate for Discharge 08/02/2021 0957 by Charlyne Petrin, RN Outcome: Adequate for Discharge   Problem: Skin Integrity: Goal: Risk for impaired skin integrity will decrease 08/02/2021 0957 by Charlyne Petrin, RN Outcome: Adequate for Discharge 08/02/2021 0957 by Charlyne Petrin, RN Outcome: Adequate for Discharge   Problem: Safety: Goal: Non-violent Restraint(s) 08/02/2021 0957 by Charlyne Petrin, RN Outcome: Adequate for Discharge 08/02/2021 0957 by Charlyne Petrin, RN Outcome: Adequate for Discharge

## 2021-08-02 NOTE — Discharge Instructions (Signed)
You were seen in the emergency department for evaluation of a problem with your Foley catheter.  Upon reviewing your chart, it appears that you do not have to have a Foley catheter in at all times and there is a hospice team coming to see you tomorrow.  Please have a discussion with the hospice team about chronic indwelling Foley versus In-N-Out cath.  It appears that the Foley catheter is causing a lot of discomfort and in order to focus on your pain we will not place 1 in the emergency department today.  At this time you are safe for discharge but please return if you are unable to urinate, have severe lower abdominal pain due to to difficulty urinating or any other concerning symptoms.

## 2021-08-04 ENCOUNTER — Ambulatory Visit (HOSPITAL_COMMUNITY): Payer: Managed Care, Other (non HMO)

## 2021-08-04 ENCOUNTER — Ambulatory Visit: Payer: Managed Care, Other (non HMO)

## 2021-08-04 ENCOUNTER — Other Ambulatory Visit: Payer: Self-pay

## 2021-08-04 ENCOUNTER — Other Ambulatory Visit: Payer: Managed Care, Other (non HMO)

## 2021-08-04 ENCOUNTER — Ambulatory Visit: Payer: Managed Care, Other (non HMO) | Admitting: Hematology

## 2021-08-05 ENCOUNTER — Ambulatory Visit: Payer: Managed Care, Other (non HMO)

## 2021-08-05 LAB — CULTURE, BLOOD (ROUTINE X 2)
Culture: NO GROWTH
Culture: NO GROWTH
Special Requests: ADEQUATE
Special Requests: ADEQUATE

## 2021-08-06 ENCOUNTER — Ambulatory Visit: Payer: Managed Care, Other (non HMO)

## 2021-08-06 ENCOUNTER — Telehealth (HOSPITAL_COMMUNITY): Payer: Self-pay | Admitting: *Deleted

## 2021-08-06 NOTE — Telephone Encounter (Signed)
Meredith Goodman's daughter [name] was contacted by telephone to verify that hospice had been to the home and provided what was needed for them.  Stated that they have and are pleased with the support.  Needs are being met at this time.     [name]'s questions were addressed to their satisfaction upon completion of this post discharge follow-up call for outpatient oncology.

## 2021-08-07 ENCOUNTER — Ambulatory Visit: Payer: Self-pay

## 2021-08-07 ENCOUNTER — Ambulatory Visit: Payer: Self-pay | Admitting: Hematology

## 2021-08-07 ENCOUNTER — Other Ambulatory Visit: Payer: Self-pay

## 2021-08-07 NOTE — Progress Notes (Signed)
Faxed last office note to [facility] at [facility] T: [phone]  F: [phone] per pt's request.

## 2021-08-08 ENCOUNTER — Ambulatory Visit: Payer: Managed Care, Other (non HMO)

## 2021-08-11 ENCOUNTER — Other Ambulatory Visit: Payer: Self-pay

## 2021-08-11 ENCOUNTER — Other Ambulatory Visit (HOSPITAL_COMMUNITY): Payer: Self-pay

## 2021-08-11 NOTE — Progress Notes (Signed)
Received voicemail message from [facility] nurse stating pt is deceased.  Pt died on 2021-08-21 @1850  at the [facility].  Notified Dr. Burr Medico and HIM.

## 2021-08-13 DEATH — deceased

## 2021-08-30 NOTE — Progress Notes (Signed)
Open in error

## 2022-07-24 IMAGING — CT CT ABD-PELV W/ CM
2 of 5 series · 15 of 46 positions shown, 17 images · IV contrast (OMNIPAQUE 350)
Comparison: 02/28/2021 PET-CT

CLINICAL DATA: Abdominal pain

EXAM:
CT ABDOMEN AND PELVIS WITH CONTRAST
TECHNIQUE: Multidetector CT imaging of the abdomen and pelvis was performed
using the standard protocol following bolus administration of
intravenous contrast.
CONTRAST:  60mL OMNIPAQUE IOHEXOL 350 MG/ML SOLN

[Series 2: axial st · axial · 0.62mm/px · z∈[-331,-1]mm · 12 of 78 slices shown, 14 images]
[im 6/78  soft-tissue]
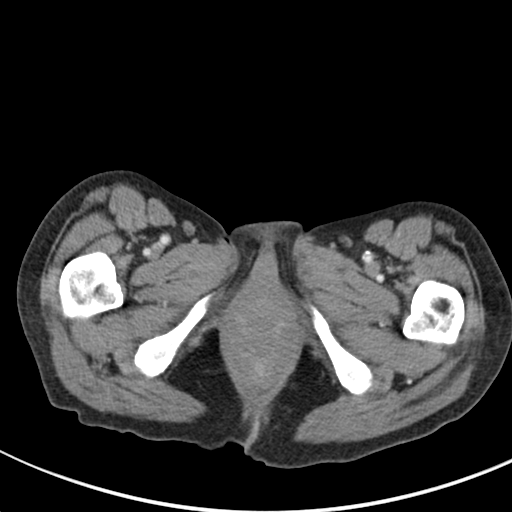
[im 6/78  bone]
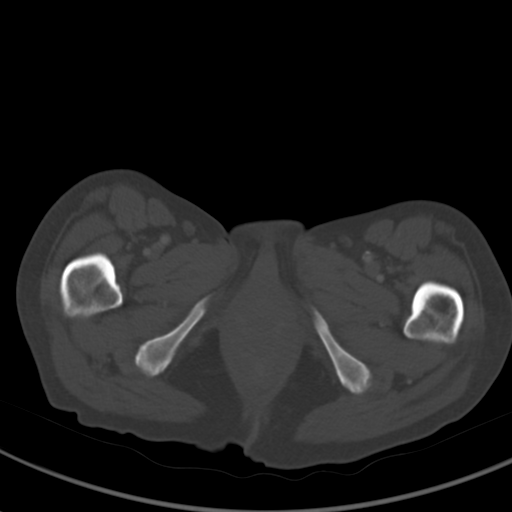
[im 11/78  soft-tissue]
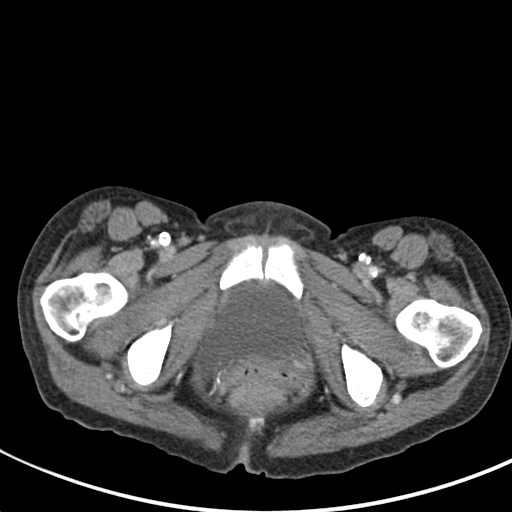
[im 16/78  soft-tissue]
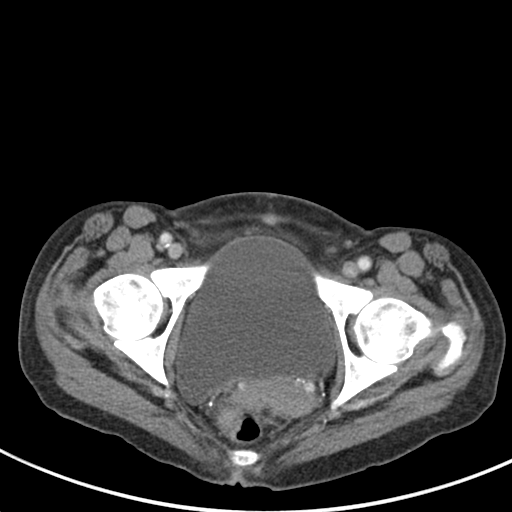
[im 26/78  soft-tissue]
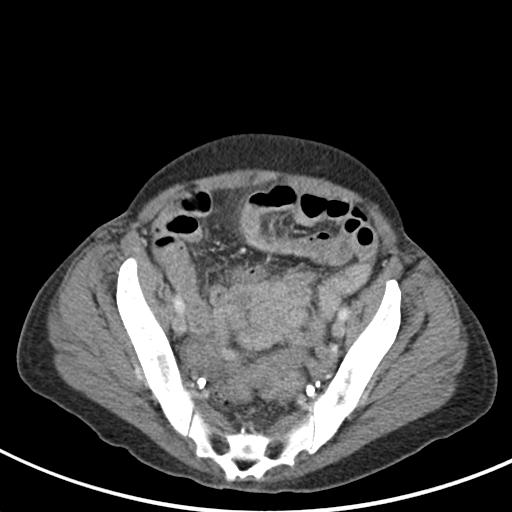
[im 31/78  soft-tissue]
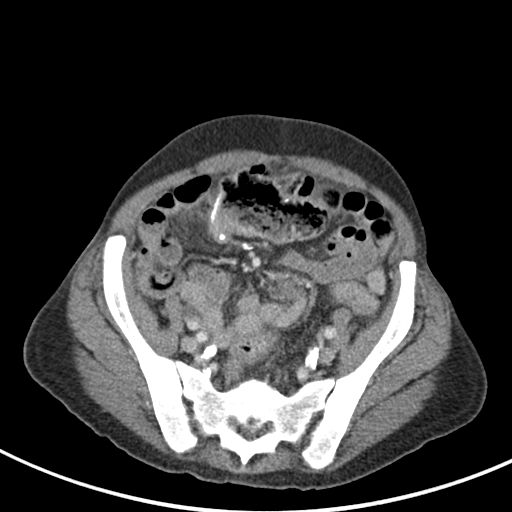
[im 36/78  soft-tissue]
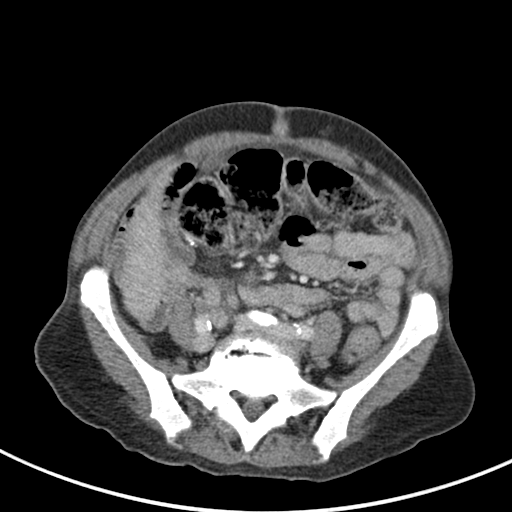
[im 42/78  soft-tissue]
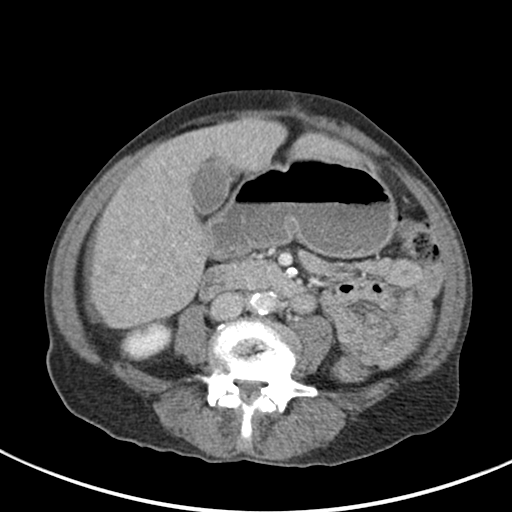
[im 47/78  soft-tissue]
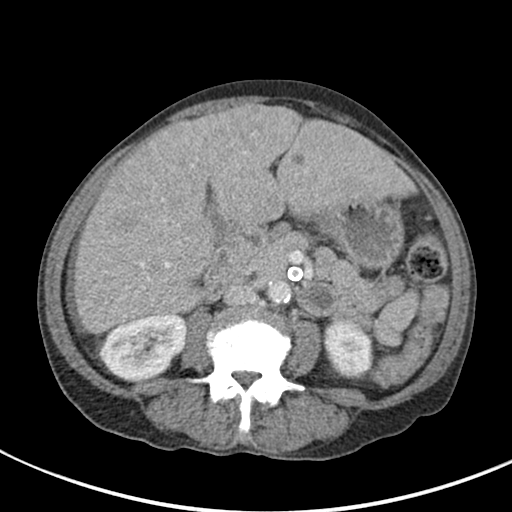
[im 52/78  soft-tissue]
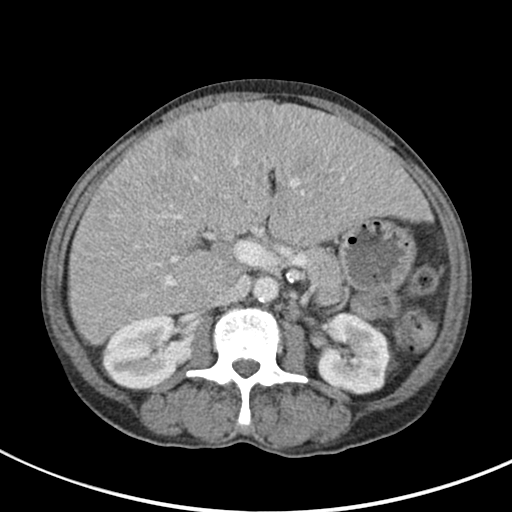
[im 52/78  bone]
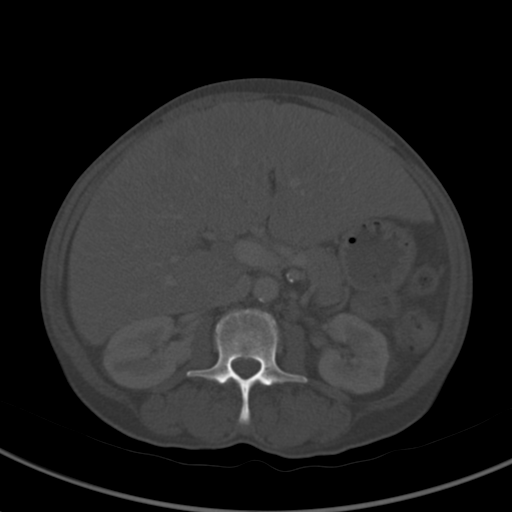
[im 62/78  soft-tissue]
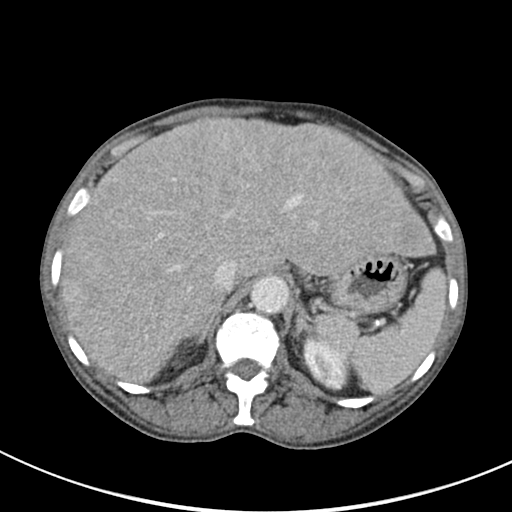
[im 67/78  soft-tissue]
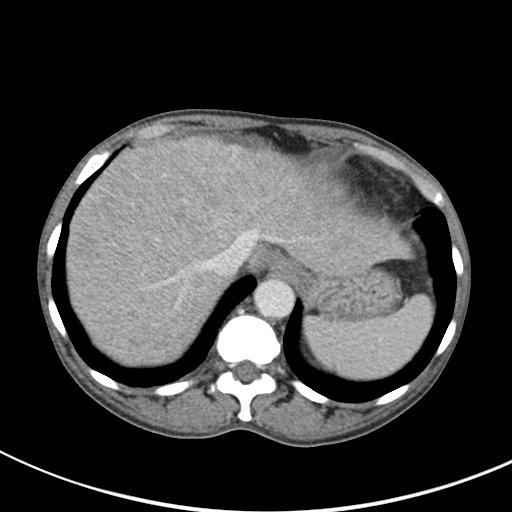
[im 72/78  soft-tissue]
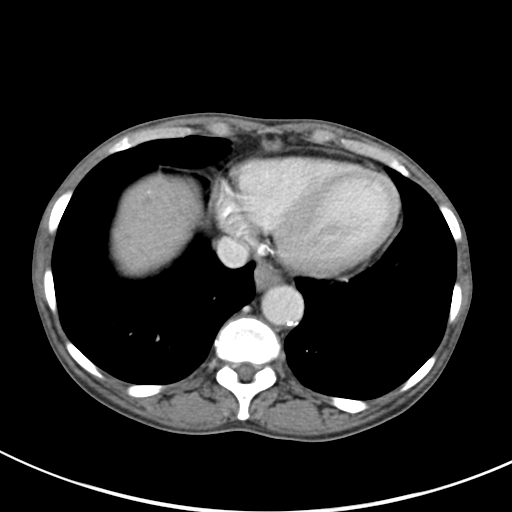

[Series 5: coronal st · coronal · 0.59mm/px · 3 of 134 slices shown]
[im 45/134  soft-tissue]
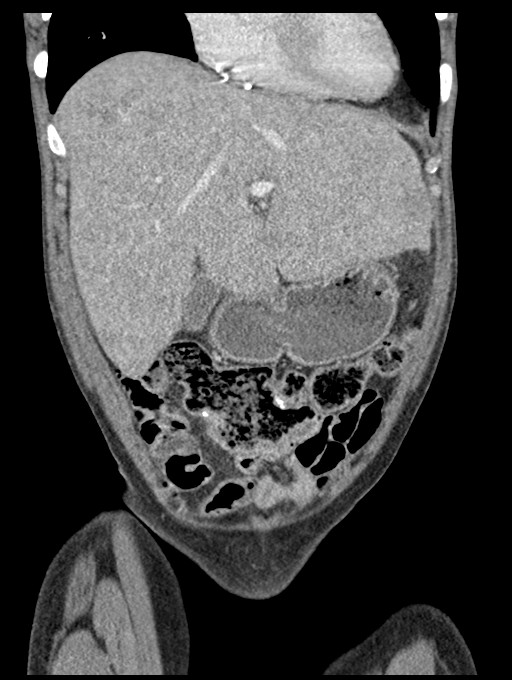
[im 60/134  soft-tissue]
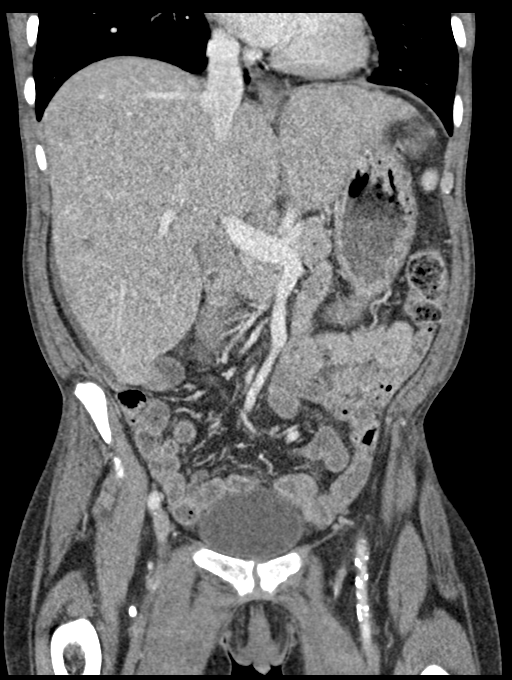
[im 74/134  soft-tissue]
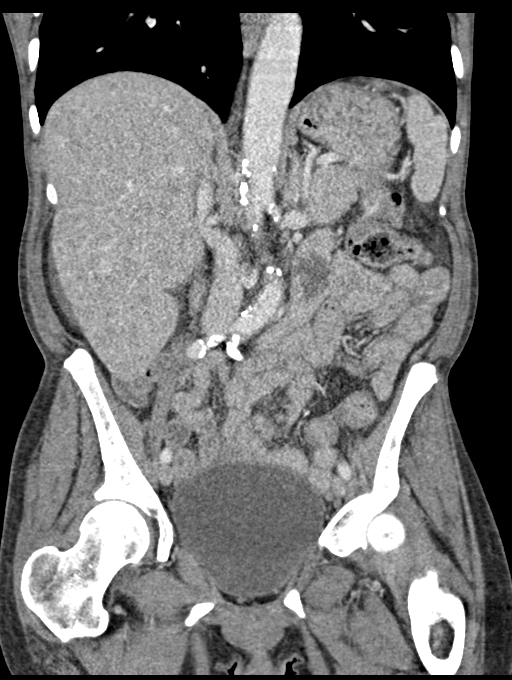

[15 of 46 positions shown; findings below may reference images not displayed]

FINDINGS: Lower chest: Lung bases are free of acute infiltrate or sizable
effusion. There is a 3-4 mm nodule identified in the medial aspect
of the right lung base best seen on image number 24 of series 4.
This was not present on the recent PET-CT from 02/28/2021.

Hepatobiliary: Liver is well visualized with a few small cysts
identified within the left lobe of the liver and inferiorly in the
right lobe of the liver stable in appearance from the prior CT.
There are however new peripherally enhancing lesions scattered
throughout the liver dominant lesion in the left lobe measures
cm best seen on image number 18 of series 2. Dominant lesion on the
right measures approximately 2 cm best seen on image number 32 of
series 2. These changes are new from the prior PET-CT examination
and consistent with metastatic disease from the known neuroendocrine
tumor the gallbladder appears within normal limits.

Pancreas: Unremarkable. No pancreatic ductal dilatation or
surrounding inflammatory changes.

Spleen: Normal in size without focal abnormality.

Adrenals/Urinary Tract: Adrenal glands are unremarkable. Kidneys
demonstrate a normal enhancement pattern bilaterally. No renal
calculi or obstructive changes are seen. Normal excretion of
contrast is noted bilaterally. The bladder is well distended.

Stomach/Bowel: Colon shows no obstructive or inflammatory changes.
There are findings of prior resection of the right colon and
proximal transverse colon seen. No small bowel obstructive changes
are noted. The stomach is within normal limits.

Vascular/Lymphatic: Diffuse vascular calcifications are seen.
Lymphadenopathy is noted in the porta hepatis which was not well
appreciated on recent PET-CT measuring up to 17 mm in short axis. No
other definitive lymphadenopathy is seen.

Reproductive: Uterus and bilateral adnexa are unremarkable.

Other: No abdominal wall hernia or abnormality. No abdominopelvic
ascites.

Musculoskeletal: No acute or significant osseous findings.
IMPRESSION: Multiple too numerous to count peripherally enhancing lesions within
the liver consistent with metastatic disease. Associated porta
hepatis lymph nodes are noted as well.

4 mm nodule in the right lung base not seen on the prior PET-CT
examination and given the findings in the liver is somewhat
suspicious for metastatic disease.

Changes of prior right colectomy.

## 2022-08-23 IMAGING — DX DG CHEST 1V PORT
1 series · 1 of 1 positions shown · non-contrast
Comparison: Chest x-ray 07/24/2021 and chest CT 07/24/2021.

CLINICAL DATA: Shortness of breath.

EXAM:
PORTABLE CHEST 1 VIEW

[chest ap]
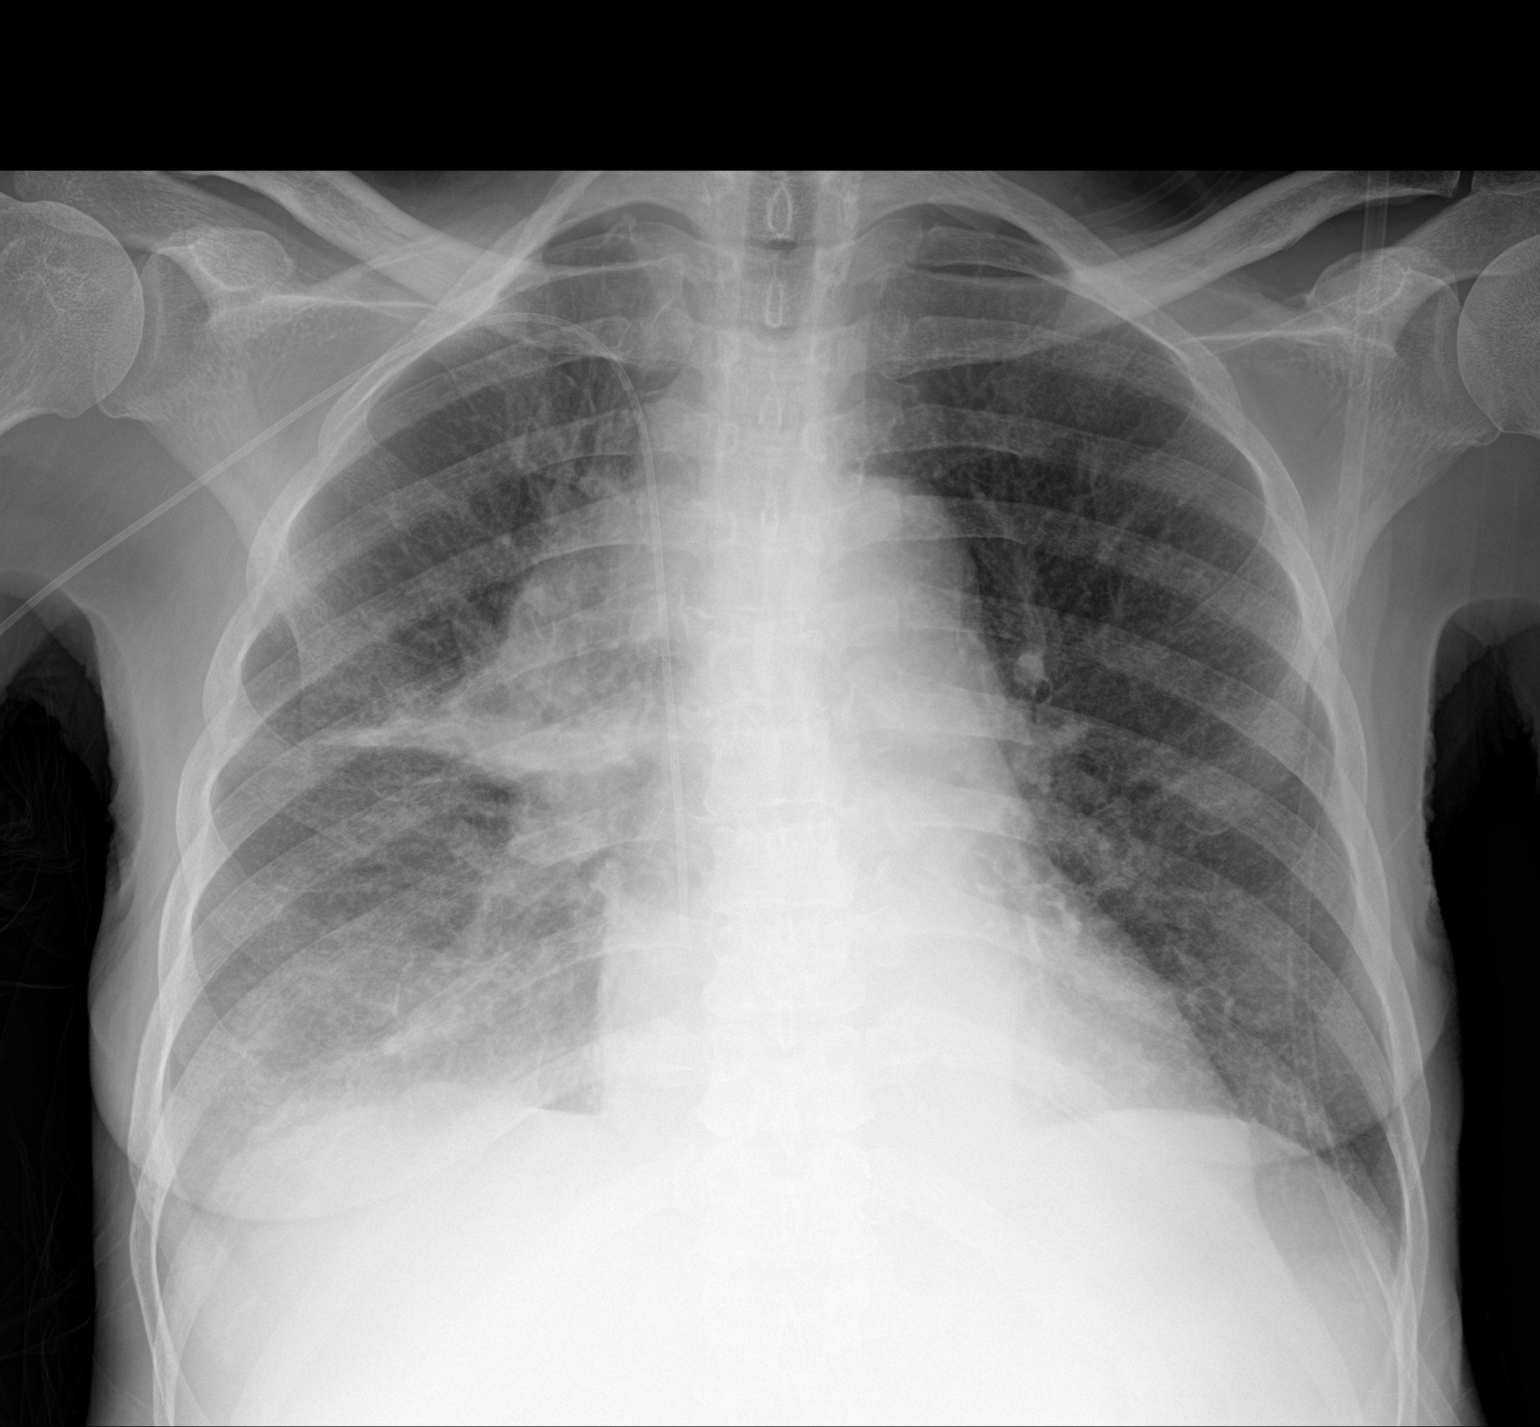

[1 of 1 positions shown; findings below may reference images not displayed]

FINDINGS: New right upper extremity PICC terminates in the distal SVC. No
pneumothorax.

Stable right upper lobe/perihilar focal density. Stable small right
pleural effusion. Cardiac silhouette within normal limits. No acute
fractures.
IMPRESSION: 1. New right upper extremity PICC line terminates in the distal SVC.
2. No pneumothorax.
3. Stable small right pleural effusion and right upper
lobe/perihilar density.
